# Patient Record
Sex: Female | Born: 1940 | Race: White | Hispanic: No | Marital: Single | Smoking: Former smoker
Health system: Southern US, Community
[De-identification: ages and names within clinical notes are randomized; demographics above are authoritative.]

## PROBLEM LIST (undated history)

## (undated) DIAGNOSIS — Z972 Presence of dental prosthetic device (complete) (partial): Secondary | ICD-10-CM

## (undated) DIAGNOSIS — A389 Scarlet fever, uncomplicated: Secondary | ICD-10-CM

## (undated) DIAGNOSIS — G4730 Sleep apnea, unspecified: Secondary | ICD-10-CM

## (undated) DIAGNOSIS — T8859XA Other complications of anesthesia, initial encounter: Secondary | ICD-10-CM

## (undated) DIAGNOSIS — F419 Anxiety disorder, unspecified: Secondary | ICD-10-CM

## (undated) DIAGNOSIS — G14 Postpolio syndrome: Secondary | ICD-10-CM

## (undated) DIAGNOSIS — M1990 Unspecified osteoarthritis, unspecified site: Secondary | ICD-10-CM

## (undated) DIAGNOSIS — I10 Essential (primary) hypertension: Secondary | ICD-10-CM

## (undated) DIAGNOSIS — T7840XA Allergy, unspecified, initial encounter: Secondary | ICD-10-CM

## (undated) DIAGNOSIS — T4145XA Adverse effect of unspecified anesthetic, initial encounter: Secondary | ICD-10-CM

## (undated) DIAGNOSIS — M2578 Osteophyte, vertebrae: Secondary | ICD-10-CM

## (undated) DIAGNOSIS — M2170 Unequal limb length (acquired), unspecified site: Secondary | ICD-10-CM

## (undated) DIAGNOSIS — J342 Deviated nasal septum: Secondary | ICD-10-CM

## (undated) HISTORY — DX: Essential (primary) hypertension: I10

## (undated) HISTORY — DX: Osteophyte, vertebrae: M25.78

## (undated) HISTORY — PX: APPENDECTOMY: SHX54

## (undated) HISTORY — DX: Allergy, unspecified, initial encounter: T78.40XA

## (undated) HISTORY — DX: Deviated nasal septum: J34.2

## (undated) HISTORY — PX: NASAL SEPTUM SURGERY: SHX37

## (undated) HISTORY — PX: CHOLECYSTECTOMY: SHX55

## (undated) HISTORY — PX: COLONOSCOPY WITH ESOPHAGOGASTRODUODENOSCOPY (EGD): SHX5779

## (undated) HISTORY — DX: Unspecified osteoarthritis, unspecified site: M19.90

## (undated) HISTORY — DX: Anxiety disorder, unspecified: F41.9

---
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
## 1998-09-14 ENCOUNTER — Encounter: Payer: Self-pay | Admitting: Family Medicine

## 2002-09-30 LAB — HM COLONOSCOPY

## 2005-08-07 ENCOUNTER — Encounter: Payer: Self-pay | Admitting: Family Medicine

## 2007-03-12 LAB — CONVERTED CEMR LAB: Pap Smear: NORMAL

## 2007-10-02 ENCOUNTER — Ambulatory Visit: Payer: Self-pay | Admitting: Family Medicine

## 2007-10-02 DIAGNOSIS — F411 Generalized anxiety disorder: Secondary | ICD-10-CM | POA: Insufficient documentation

## 2007-10-02 DIAGNOSIS — B91 Sequelae of poliomyelitis: Secondary | ICD-10-CM

## 2007-10-02 DIAGNOSIS — M1990 Unspecified osteoarthritis, unspecified site: Secondary | ICD-10-CM | POA: Insufficient documentation

## 2007-10-02 DIAGNOSIS — I10 Essential (primary) hypertension: Secondary | ICD-10-CM | POA: Insufficient documentation

## 2007-10-02 DIAGNOSIS — G4730 Sleep apnea, unspecified: Secondary | ICD-10-CM

## 2007-10-02 DIAGNOSIS — J309 Allergic rhinitis, unspecified: Secondary | ICD-10-CM | POA: Insufficient documentation

## 2007-10-02 DIAGNOSIS — R110 Nausea: Secondary | ICD-10-CM | POA: Insufficient documentation

## 2007-10-02 DIAGNOSIS — K644 Residual hemorrhoidal skin tags: Secondary | ICD-10-CM | POA: Insufficient documentation

## 2007-10-02 DIAGNOSIS — K449 Diaphragmatic hernia without obstruction or gangrene: Secondary | ICD-10-CM | POA: Insufficient documentation

## 2007-10-02 DIAGNOSIS — F341 Dysthymic disorder: Secondary | ICD-10-CM | POA: Insufficient documentation

## 2007-10-02 DIAGNOSIS — G4733 Obstructive sleep apnea (adult) (pediatric): Secondary | ICD-10-CM | POA: Insufficient documentation

## 2007-10-02 DIAGNOSIS — J4540 Moderate persistent asthma, uncomplicated: Secondary | ICD-10-CM | POA: Insufficient documentation

## 2007-10-02 DIAGNOSIS — G4700 Insomnia, unspecified: Secondary | ICD-10-CM

## 2007-10-02 DIAGNOSIS — E739 Lactose intolerance, unspecified: Secondary | ICD-10-CM | POA: Insufficient documentation

## 2007-10-02 DIAGNOSIS — R1310 Dysphagia, unspecified: Secondary | ICD-10-CM | POA: Insufficient documentation

## 2007-12-03 ENCOUNTER — Encounter: Payer: Self-pay | Admitting: Family Medicine

## 2007-12-03 DIAGNOSIS — N952 Postmenopausal atrophic vaginitis: Secondary | ICD-10-CM

## 2007-12-03 DIAGNOSIS — M8580 Other specified disorders of bone density and structure, unspecified site: Secondary | ICD-10-CM

## 2007-12-03 DIAGNOSIS — M899 Disorder of bone, unspecified: Secondary | ICD-10-CM | POA: Insufficient documentation

## 2007-12-16 ENCOUNTER — Encounter: Payer: Self-pay | Admitting: Family Medicine

## 2008-01-05 ENCOUNTER — Ambulatory Visit: Payer: Self-pay | Admitting: Family Medicine

## 2008-02-01 ENCOUNTER — Telehealth: Payer: Self-pay | Admitting: Family Medicine

## 2008-03-29 ENCOUNTER — Ambulatory Visit: Payer: Self-pay | Admitting: Family Medicine

## 2008-03-29 DIAGNOSIS — E7800 Pure hypercholesterolemia, unspecified: Secondary | ICD-10-CM

## 2008-03-31 ENCOUNTER — Ambulatory Visit: Payer: Self-pay | Admitting: Family Medicine

## 2008-04-05 LAB — CONVERTED CEMR LAB
ALT: 45 units/L — ABNORMAL HIGH (ref 0–35)
AST: 33 units/L (ref 0–37)
Albumin: 3.9 g/dL (ref 3.5–5.2)
BUN: 15 mg/dL (ref 6–23)
CO2: 31 meq/L (ref 19–32)
Calcium: 10.4 mg/dL (ref 8.4–10.5)
Chloride: 104 meq/L (ref 96–112)
Cholesterol: 227 mg/dL (ref 0–200)
Creatinine, Ser: 1.1 mg/dL (ref 0.4–1.2)
Direct LDL: 115.6 mg/dL
Hgb A1c MFr Bld: 5.8 % (ref 4.6–6.0)
Total Bilirubin: 0.8 mg/dL (ref 0.3–1.2)
Total CHOL/HDL Ratio: 2.9
Triglycerides: 72 mg/dL (ref 0–149)
VLDL: 14 mg/dL (ref 0–40)

## 2008-04-08 ENCOUNTER — Ambulatory Visit: Payer: Self-pay | Admitting: Family Medicine

## 2008-04-08 ENCOUNTER — Encounter: Payer: Self-pay | Admitting: Family Medicine

## 2008-04-08 LAB — CONVERTED CEMR LAB
BUN: 16 mg/dL (ref 6–23)
Calcium: 10.6 mg/dL — ABNORMAL HIGH (ref 8.4–10.5)
Chloride: 103 meq/L (ref 96–112)
Creatinine, Ser: 1 mg/dL (ref 0.4–1.2)
GFR calc Af Amer: 72 mL/min
GFR calc non Af Amer: 59 mL/min

## 2008-04-11 ENCOUNTER — Ambulatory Visit: Payer: Self-pay | Admitting: Family Medicine

## 2008-04-11 LAB — CONVERTED CEMR LAB: OCCULT 1: NEGATIVE

## 2008-04-15 ENCOUNTER — Telehealth: Payer: Self-pay | Admitting: Family Medicine

## 2008-04-15 ENCOUNTER — Ambulatory Visit: Payer: Self-pay | Admitting: Family Medicine

## 2008-04-15 LAB — CONVERTED CEMR LAB
BUN: 14 mg/dL (ref 6–23)
CO2: 29 meq/L (ref 19–32)
Calcium: 10.2 mg/dL (ref 8.4–10.5)
Creatinine, Ser: 1 mg/dL (ref 0.4–1.2)
GFR calc Af Amer: 72 mL/min
Glucose, Bld: 98 mg/dL (ref 70–99)

## 2008-04-26 ENCOUNTER — Encounter: Payer: Self-pay | Admitting: Family Medicine

## 2008-04-26 ENCOUNTER — Telehealth: Payer: Self-pay | Admitting: Family Medicine

## 2008-05-24 ENCOUNTER — Telehealth: Payer: Self-pay | Admitting: Family Medicine

## 2008-06-17 ENCOUNTER — Encounter (INDEPENDENT_AMBULATORY_CARE_PROVIDER_SITE_OTHER): Payer: Self-pay | Admitting: *Deleted

## 2008-06-17 ENCOUNTER — Telehealth: Payer: Self-pay | Admitting: Family Medicine

## 2008-06-24 ENCOUNTER — Telehealth: Payer: Self-pay | Admitting: Family Medicine

## 2008-06-29 ENCOUNTER — Ambulatory Visit: Payer: Self-pay | Admitting: Family Medicine

## 2008-07-01 LAB — CONVERTED CEMR LAB
Direct LDL: 138.1 mg/dL
Hgb A1c MFr Bld: 5.7 % (ref 4.6–6.0)
Total CHOL/HDL Ratio: 3.6
Triglycerides: 151 mg/dL — ABNORMAL HIGH (ref 0–149)

## 2008-07-05 ENCOUNTER — Ambulatory Visit: Payer: Self-pay | Admitting: Family Medicine

## 2008-07-21 ENCOUNTER — Telehealth: Payer: Self-pay | Admitting: Family Medicine

## 2008-08-16 ENCOUNTER — Ambulatory Visit: Payer: Self-pay | Admitting: Family Medicine

## 2008-08-22 ENCOUNTER — Telehealth: Payer: Self-pay | Admitting: Family Medicine

## 2008-09-20 ENCOUNTER — Telehealth: Payer: Self-pay | Admitting: Family Medicine

## 2008-09-22 ENCOUNTER — Telehealth: Payer: Self-pay | Admitting: Family Medicine

## 2008-10-05 ENCOUNTER — Ambulatory Visit: Payer: Self-pay | Admitting: Family Medicine

## 2008-10-11 ENCOUNTER — Ambulatory Visit: Payer: Self-pay | Admitting: Family Medicine

## 2008-10-11 DIAGNOSIS — E876 Hypokalemia: Secondary | ICD-10-CM | POA: Insufficient documentation

## 2008-10-11 LAB — CONVERTED CEMR LAB
ALT: 23 units/L (ref 0–35)
Albumin: 3.9 g/dL (ref 3.5–5.2)
BUN: 15 mg/dL (ref 6–23)
Bilirubin, Direct: 0.1 mg/dL (ref 0.0–0.3)
CO2: 33 meq/L — ABNORMAL HIGH (ref 19–32)
Calcium: 10.9 mg/dL — ABNORMAL HIGH (ref 8.4–10.5)
Cholesterol: 202 mg/dL (ref 0–200)
Direct LDL: 90 mg/dL
GFR calc Af Amer: 71 mL/min
Glucose, Bld: 81 mg/dL (ref 70–99)
HDL: 77.6 mg/dL (ref >39.0)
Sodium: 142 meq/L (ref 135–145)
Total Protein: 7.7 g/dL (ref 6.0–8.3)
Triglycerides: 129 mg/dL (ref 0–149)

## 2008-10-20 ENCOUNTER — Telehealth: Payer: Self-pay | Admitting: Family Medicine

## 2008-11-17 ENCOUNTER — Telehealth: Payer: Self-pay | Admitting: Family Medicine

## 2008-12-15 ENCOUNTER — Telehealth: Payer: Self-pay | Admitting: Family Medicine

## 2009-01-11 ENCOUNTER — Telehealth: Payer: Self-pay | Admitting: Family Medicine

## 2009-02-09 ENCOUNTER — Telehealth: Payer: Self-pay | Admitting: Family Medicine

## 2009-03-09 ENCOUNTER — Telehealth: Payer: Self-pay | Admitting: Family Medicine

## 2009-04-06 ENCOUNTER — Telehealth: Payer: Self-pay | Admitting: Family Medicine

## 2009-04-11 ENCOUNTER — Ambulatory Visit: Payer: Self-pay | Admitting: Family Medicine

## 2009-04-11 LAB — CONVERTED CEMR LAB
Alkaline Phosphatase: 45 units/L (ref 39–117)
Bilirubin, Direct: 0.1 mg/dL (ref 0.0–0.3)
Calcium: 9.7 mg/dL (ref 8.4–10.5)
Cholesterol: 223 mg/dL — ABNORMAL HIGH (ref 0–200)
Creatinine, Ser: 1 mg/dL (ref 0.4–1.2)
Sodium: 140 meq/L (ref 135–145)
Total Bilirubin: 1.1 mg/dL (ref 0.3–1.2)
Total CHOL/HDL Ratio: 3
Triglycerides: 131 mg/dL (ref 0.0–149.0)
VLDL: 26.2 mg/dL (ref 0.0–40.0)

## 2009-04-18 ENCOUNTER — Ambulatory Visit: Payer: Self-pay | Admitting: Family Medicine

## 2009-05-04 ENCOUNTER — Telehealth: Payer: Self-pay | Admitting: Family Medicine

## 2009-06-02 ENCOUNTER — Telehealth: Payer: Self-pay | Admitting: Family Medicine

## 2009-06-27 ENCOUNTER — Ambulatory Visit: Payer: Self-pay | Admitting: Family Medicine

## 2009-06-28 LAB — CONVERTED CEMR LAB
BUN: 10 mg/dL (ref 6–23)
Chloride: 100 meq/L (ref 96–112)
Creatinine, Ser: 0.9 mg/dL (ref 0.4–1.2)
GFR calc non Af Amer: 66.35 mL/min (ref >60)
Glucose, Bld: 82 mg/dL (ref 70–99)

## 2009-07-03 ENCOUNTER — Telehealth: Payer: Self-pay | Admitting: Family Medicine

## 2009-07-06 ENCOUNTER — Encounter: Payer: Self-pay | Admitting: Family Medicine

## 2009-07-19 ENCOUNTER — Ambulatory Visit: Payer: Self-pay | Admitting: Family Medicine

## 2009-07-20 LAB — CONVERTED CEMR LAB
Calcium: 10.2 mg/dL (ref 8.4–10.5)
Creatinine, Ser: 1.1 mg/dL (ref 0.4–1.2)
GFR calc non Af Amer: 52.63 mL/min (ref >60)
Glucose, Bld: 95 mg/dL (ref 70–99)
Sodium: 142 meq/L (ref 135–145)

## 2009-07-31 ENCOUNTER — Telehealth: Payer: Self-pay | Admitting: Family Medicine

## 2009-08-07 LAB — HM MAMMOGRAPHY: HM Mammogram: NORMAL

## 2009-08-28 ENCOUNTER — Telehealth: Payer: Self-pay | Admitting: Family Medicine

## 2009-09-26 ENCOUNTER — Telehealth: Payer: Self-pay | Admitting: Family Medicine

## 2009-10-24 ENCOUNTER — Telehealth: Payer: Self-pay | Admitting: Family Medicine

## 2009-10-25 ENCOUNTER — Ambulatory Visit: Payer: Self-pay | Admitting: Family Medicine

## 2009-10-27 LAB — CONVERTED CEMR LAB
Chloride: 105 meq/L (ref 96–112)
Creatinine, Ser: 1.1 mg/dL (ref 0.4–1.2)
GFR calc non Af Amer: 52.59 mL/min (ref >60)

## 2009-11-22 ENCOUNTER — Telehealth: Payer: Self-pay | Admitting: Family Medicine

## 2009-12-19 ENCOUNTER — Ambulatory Visit: Payer: Self-pay | Admitting: Family Medicine

## 2009-12-21 ENCOUNTER — Telehealth: Payer: Self-pay | Admitting: Family Medicine

## 2010-01-18 ENCOUNTER — Telehealth: Payer: Self-pay | Admitting: Family Medicine

## 2010-03-22 ENCOUNTER — Telehealth: Payer: Self-pay | Admitting: Family Medicine

## 2010-04-27 ENCOUNTER — Telehealth: Payer: Self-pay | Admitting: Family Medicine

## 2010-04-27 ENCOUNTER — Ambulatory Visit: Payer: Self-pay | Admitting: Family Medicine

## 2010-07-30 ENCOUNTER — Ambulatory Visit: Payer: Self-pay | Admitting: Family Medicine

## 2010-08-07 ENCOUNTER — Ambulatory Visit: Payer: Self-pay | Admitting: Family Medicine

## 2010-08-07 LAB — CONVERTED CEMR LAB
ALT: 29 units/L (ref 0–35)
Albumin: 4.5 g/dL (ref 3.5–5.2)
BUN: 19 mg/dL (ref 6–23)
Bilirubin, Direct: 0.1 mg/dL (ref 0.0–0.3)
CO2: 26 meq/L (ref 19–32)
Calcium: 9.9 mg/dL (ref 8.4–10.5)
Chloride: 102 meq/L (ref 96–112)
Cholesterol: 262 mg/dL — ABNORMAL HIGH (ref 0–200)
Creatinine, Ser: 1 mg/dL (ref 0.4–1.2)
Direct LDL: 136.6 mg/dL
Glucose, Bld: 91 mg/dL (ref 70–99)
Total CHOL/HDL Ratio: 3
Total Protein: 7.4 g/dL (ref 6.0–8.3)
Triglycerides: 113 mg/dL (ref 0.0–149.0)

## 2010-08-14 ENCOUNTER — Ambulatory Visit: Payer: Self-pay | Admitting: Family Medicine

## 2010-12-12 NOTE — Assessment & Plan Note (Signed)
Summary: TICK BITE/JBB   Vital Signs:  Patient Profile:   70 Years Old Female CC:      Tick Bite / RWT Height:     60.5 inches Weight:      136 pounds BMI:     26.22 O2 Sat:      98 % O2 treatment:    Room Air Temp:     97.8 degrees F oral Pulse rate:   114 / minute Pulse rhythm:   regular Resp:     20 per minute BP sitting:   168 / 101  (left arm)  Pt. in pain?   no  Vitals Entered By: Levonne Spiller EMT-P (April 27, 2010 11:30 AM)              Is Patient Diabetic? No      Current Allergies: ! ERYTHROMYCIN ! CODEINEHistory of Present Illness History from: patient Reason for visit: see chief complaint Chief Complaint: Tick Bite / RWT History of Present Illness: Patient noticed a tick below her left lateral ankle this morning when she woke up. There was erythema present before she removed the tick. She's unsure of how long it was there as she could not see it. She saved the tick and brought it in. Denies itching or irritation. No f/c malaise or headache.   REVIEW OF SYSTEMS Constitutional Symptoms      Denies fever, chills, night sweats, weight loss, weight gain, and fatigue.  Eyes       Denies change in vision, eye pain, eye discharge, glasses, contact lenses, and eye surgery. Ear/Nose/Throat/Mouth       Denies hearing loss/aids, change in hearing, ear pain, ear discharge, dizziness, frequent runny nose, frequent nose bleeds, sinus problems, sore throat, hoarseness, and tooth pain or bleeding.  Respiratory       Denies dry cough, productive cough, wheezing, shortness of breath, asthma, bronchitis, and emphysema/COPD.  Cardiovascular       Denies murmurs, chest pain, and tires easily with exhertion.    Gastrointestinal       Denies stomach pain, nausea/vomiting, diarrhea, constipation, blood in bowel movements, and indigestion. Genitourniary       Denies painful urination, kidney stones, and loss of urinary control. Neurological       Denies paralysis, seizures, and  fainting/blackouts. Musculoskeletal       Denies muscle pain, joint pain, joint stiffness, decreased range of motion, redness, swelling, muscle weakness, and gout.  Skin       Denies bruising, unusual mles/lumps or sores, and hair/skin or nail changes.  Psych       Denies mood changes, temper/anger issues, anxiety/stress, speech problems, depression, and sleep problems. Physical Exam General appearance: well developed, well nourished, no acute distress Chest/Lungs: no rales, wheezes, or rhonchi bilateral, breath sounds equal without effort Heart: regular rate and  rhythm, no murmur Skin: 1cm non blanching erythematous patch below left lateral malleolus MSE: anxious Assessment New Problems: INSECT BITE (ICD-919.4) TICK BITE (ICD-E906.4)   Plan New Medications/Changes: DOXYCYCLINE HYCLATE 100 MG TABS (DOXYCYCLINE HYCLATE) 2 by mouth x 1 dose  #2 x 0, 04/27/2010, Tacey Ruiz MD  New Orders: Specimen Handling [99000] Est. Patient Level II [13244] T-tick ID with reflex to Lyme Disease [01027]  The patient and/or caregiver has been counseled thoroughly with regard to medications prescribed including dosage, schedule, interactions, rationale for use, and possible side effects and they verbalize understanding.  Diagnoses and expected course of recovery discussed and will return if not improved as expected  or if the condition worsens. Patient and/or caregiver verbalized understanding.  Prescriptions: DOXYCYCLINE HYCLATE 100 MG TABS (DOXYCYCLINE HYCLATE) 2 by mouth x 1 dose  #2 x 0   Entered and Authorized by:   Tacey Ruiz MD   Signed by:   Tacey Ruiz MD on 04/27/2010   Method used:   Electronically to        [facility] [address] (retail)       [address], [address]       [place], [place]  [address]       Ph: [phone]       Fax: [phone]   RxID:   [id number]   Patient Instructions: 1)  Continue to monitor the area where the tick was  removed. If you noticed that the red area is spreading please call/return. If you develop fever/chills, fatigue, neck stiffness, headache or arthralgias then call/return.   Orders Added: 1)  Specimen Handling [99000] 2)  Est. Patient Level II [40102] 3)  T-tick ID with reflex to Lyme Disease [81666]  The risks, benefits and possible side effects of the treatments and tests were explained clearly to the patient and the patient verbalized understanding.  The patient was informed that there is no on-call provider or services available at this clinic during off-hours (when the clinic is closed).  If the patient developed a problem or concern that required immediate attention, the patient was advised to go the the nearest available urgent care or emergency department for medical care.  The patient verbalized understanding.

## 2010-12-12 NOTE — Progress Notes (Signed)
  Phone Note Refill Request Message from:  Scriptline on November 22, 2009 10:51 AM  Refills Requested: Medication #1:  ATIVAN 1 MG  TABS Take 1 tab by mouth at bedtime as needed   Supply Requested: [facility] month [facility] [phone]   Method Requested: Telephone to Pharmacy Initial call taken by: Benny Lennert CMA Duncan Dull),  November 22, 2009 10:51 AM  Follow-up for Phone Call        rx called in Follow-up by: Benny Lennert CMA (AAMA),  November 23, 2009 11:22 AM    Prescriptions: ATIVAN 1 MG  TABS (LORAZEPAM) Take 1 tab by mouth at bedtime as needed  #30 x 0   Entered and Authorized by:   Kerby Nora MD   Signed by:   Kerby Nora MD on 11/23/2009   Method used:   Telephoned to ...       [facility] [address] (retail)       [address]       [place], [place]  [address]       Ph: [phone]       Fax: [phone]   RxID:   [id number]

## 2010-12-12 NOTE — Progress Notes (Signed)
Summary: Rx Ativan   Phone Note Refill Request Call back at [phone] Message from:  Scriptline on December 21, 2009 10:48 AM  Refills Requested: Medication #1:  ATIVAN 1 MG  TABS Take 1 tab by mouth at bedtime as needed   Last Refilled: 11/23/2009 Received e-script request, please advise   Method Requested: Telephone to Pharmacy Initial call taken by: Linde Gillis CMA [facility] Dull),  December 21, 2009 10:51 AM  Follow-up for Phone Call        Rx called to pharmacy Follow-up by: Linde Gillis CMA [facility] Dull),  December 21, 2009 4:51 PM    Prescriptions: ATIVAN 1 MG  TABS (LORAZEPAM) Take 1 tab by mouth at bedtime as needed  #30 x 0   Entered and Authorized by:   Kerby Nora MD   Signed by:   Kerby Nora MD on 12/21/2009   Method used:   Telephoned to ...       [facility] [address] (retail)       [address]       [place], [place]  [address]       Ph: [phone]       Fax: [phone]   RxID:   [id number]

## 2010-12-12 NOTE — Progress Notes (Signed)
  Phone Note Refill Request Message from:  Scriptline on January 18, 2010 9:33 AM  Refills Requested: Medication #1:  ATIVAN 1 MG  TABS Take 1 tab by mouth at bedtime as needed   Supply Requested: 1 month [facility] [address]   Method Requested: Telephone to Pharmacy Initial call taken by: Benny Lennert CMA Duncan Dull),  January 18, 2010 9:34 AM  Follow-up for Phone Call        Rx called to pharmacy Follow-up by: Benny Lennert CMA Duncan Dull),  January 22, 2010 7:42 AM    Prescriptions: ATIVAN 1 MG  TABS (LORAZEPAM) Take 1 tab by mouth at bedtime as needed  #30 x 0   Entered and Authorized by:   Kerby Nora MD   Signed by:   Kerby Nora MD on 01/22/2010   Method used:   Telephoned to ...       [facility] [address] (retail)       [address]       [place], [place]  [address]       Ph: [phone]       Fax: [phone]   RxID:   [id number]

## 2010-12-12 NOTE — Assessment & Plan Note (Signed)
Summary: CPX.Marland KitchenMarland KitchenRESCH W/ PT CYD   Vital Signs:  Patient profile:   70 year old female Height:      60.5 inches Weight:      137.0 pounds BMI:     26.41 Temp:     97.8 degrees F oral Pulse rate:   96 / minute Pulse rhythm:   regular BP sitting:   120 / 78  (left arm) Cuff size:   large  Vitals Entered By: Benny Lennert CMA Duncan Dull) (August 14, 2010 11:09 AM)  History of Present Illness: HTN,well controlled  High cholesterol..LDL elevated some above goal LDL <130. Red yeast rice causes constipation.  In past few months shehas been feeling fluttering in chest..most often in AMs. Wearing CPAP at night.  No exertional chest pain. No increase in SOB. Last EKG 4 years ago...she refuses work up further at this time.  She feels it is due to post polio .  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  Problems Prior to Update: 1)  Need Prophylactic Vaccination&inoculation Flu  (ICD-V04.81) 2)  Hypokalemia  (ICD-276.8) 3)  Hypercalcemia  (ICD-275.42) 4)  Special Screening Malig Neoplasms Other Sites  (ICD-V76.49) 5)  Skin Tag  (ICD-701.9) 6)  Other Screening Mammogram  (ICD-V76.12) 7)  Glucose Intolerance  (ICD-271.3) 8)  Hypercholesterolemia  (ICD-272.0) 9)  Osteopenia  (ICD-733.90) 10)  Vaginitis, Atrophic  (ICD-627.3) 11)  Family History of Alcoholism/addiction  (ICD-V61.41) 12)  Dysthymia  (ICD-300.4) 13)  Insomnia  (ICD-780.52) 14)  Anxiety  (ICD-300.00) 15)  Allergic Rhinitis  (ICD-477.9) 16)  Asthma  (ICD-493.90) 17)  Sleep Apnea  (ICD-780.57) 18)  Hypertension  (ICD-401.9) 19)  External Hemorrhoids  (ICD-455.3) 20)  Impaired Glucose Tolerance  (ICD-271.3) 21)  Post-polio Syndrome  (ICD-138) 22)  Osteoarthritis  (ICD-715.90) 23)  Nausea, Chronic  (ICD-787.02) 24)  Esophageal Motility Disorder, and Entire Gi Tract  (ICD-530.5) 25)  Hiatal Hernia  (ICD-553.3) 26)  Dysphagia Unspecified   (ICD-787.20)  Current Medications (verified): 1)  Advair Diskus 250-50 Mcg/dose  Misc (Fluticasone-Salmeterol) .... Two Times A Day 2)  Singulair 10 Mg  Tabs (Montelukast Sodium) .... Take 1 Tablet By Mouth Once A Day 3)  Triamterene-Hctz 75-50 Mg Tabs (Triamterene-Hctz) .... Take 1/2 Tablet Daily 4)  Potassium Chloride Crys Cr 20 Meq  Tbcr (Potassium Chloride Crys Cr) .... Take 2 Tablet By Mouth Once A Day 5)  Amlodipine Besylate 5 Mg  Tabs (Amlodipine Besylate) .... Take 1 Tablet By Mouth Once A Day 6)  Ibuprofen 800 Mg  Tabs (Ibuprofen) .... Take 1 Tablet By Mouth Four Times A Day 7)  Ra Glucosamine-Chondroit-Msm 500-400-300 Mg  Tabs (Glucosamine-Chondroitin-Msm) .... Take 3 Extended Release Tablets Per Day 8)  Caltrate 600+d Plus 600-400 Mg-Unit  Tabs (Calcium Carbonate-Vit D-Min) .... Take 1 Tablet By Mouth One Tablet A Day 9)  Proair Hfa 108 (90 Base) Mcg/act  Aers (Albuterol Sulfate) .... 2 Puffs 4 Times A Day 10)  Otc Nasal Spray .... At Bedtime, Both Nostrils 11)  Sena .Marland Kitchen.. 3 - 4 Tabs Daily 12)  Fluticasone Propionate 50 Mcg/act Susp (Fluticasone Propionate) .... 2 Sprays Per Nostril Daily 13)  Melatonin 5 Mg Tabs (Melatonin) .... One Tablet At Night 14)  Latanoproso   .005%  Allergies: 1)  ! Erythromycin 2)  ! Codeine  Past History:  Past medical, surgical, family and social histories (including risk factors) reviewed, and no changes noted (except as noted below).  Past Medical History: Reviewed history  from 10/02/2007 and no changes required. 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 05/2006 barium swallow: cervical osteophyte 8/07 stress nuclear: nml 1955  fusion R foot for club foot 1//2007: left hip x-ray: arthritis   09/18/05 sleep study severe apnea; on CPAP RLL contracture release 1950 2002 deviated septum repair Appendectomy Cholecystectomy wears AFO on right leg  Family History: Reviewed history from 10/02/2007 and no changes required. son : DM father: DM mother : lung cancer PGM: DM WUJ:WJXBJ cancer  MGF: colon cancer Family History of Alcoholism/Addiction, parents  Social History: Reviewed history from 10/02/2007 and no changes required. On disability for post polio syndrome retired [employer] fund[employer] Single Never Smoked Alcohol use-yes, wine occ  Drug use-no Regular exercise-yes, lifting weights with arms, swimming Diet: healthy   Impression & Recommendations:  Problem # 1:  HYPERTENSION (ICD-401.9) Well controlled. Continue current medication.  Her updated medication list for this problem includes:    Triamterene-hctz 75-50 Mg Tabs (Triamterene-hctz) .Marland Kitchen... Take 1/2 tablet daily    Amlodipine Besylate 5 Mg Tabs (Amlodipine besylate) .Marland Kitchen... Take 1 tablet by mouth once a day  Problem # 2:  HYPOKALEMIA (ICD-276.8) Resolved.   Problem # 3:  HYPERCHOLESTEROLEMIA (ICD-272.0) Reviewed labs in detail with pt.  She will work on lifesstyle changes...refuses medication.  Labs Reviewed: SGOT: 21 (08/07/2010)   SGPT: 29 (08/07/2010)  Lipid Goals: Chol Goal: 200 (10/11/2008)   HDL Goal: 40 (10/11/2008)   LDL Goal: 100 (10/11/2008)   TG Goal: 150 (10/11/2008)  Prior 10 Yr Risk Heart Disease: 13 % (12/19/2009)   HDL:80.80 (08/07/2010), 71.40 (04/11/2009)  LDL:125 (04/11/2009), DEL (10/05/2008)  Chol:262 (08/07/2010), 223 (04/11/2009)  Trig:113.0 (08/07/2010), 131.0 (04/11/2009)  Problem # 4:  HYPERCALCEMIA (ICD-275.42) resolved   Problem # 5:  IMPAIRED GLUCOSE TOLERANCE (ICD-271.3) Improved with lifestyle cahnge.   Problem # 6:  Preventive Health Care (ICD-V70.0) Assessment: Comment Only The patient's preventative maintenance and recommended screening tests for an annual wellness exam were  reviewed in full today. Brought up to date unless services declined.  Counselled on the importance of diet, exercise, and its role in overall health and mortality. The patient's FH and SH was reviewed, including their home life, tobacco status, and drug and alcohol status.     Complete Medication List: 1)  Advair Diskus 250-50 Mcg/dose Misc (Fluticasone-salmeterol) .... Two times a day 2)  Singulair 10 Mg Tabs (Montelukast sodium) .... Take 1 tablet by mouth once a day 3)  Triamterene-hctz 75-50 Mg Tabs (Triamterene-hctz) .... Take 1/2 tablet daily 4)  Potassium Chloride Crys Cr 20 Meq Tbcr (Potassium chloride crys cr) .... Take 2 tablet by mouth once a day 5)  Amlodipine Besylate 5 Mg Tabs (Amlodipine besylate) .... Take 1 tablet by mouth once a day 6)  Ibuprofen 800 Mg Tabs (Ibuprofen) .... Take 1 tablet by mouth four times a day 7)  Ra Glucosamine-chondroit-msm 500-400-300 Mg Tabs (Glucosamine-chondroitin-msm) .... Take 3 extended release tablets per day 8)  Caltrate 600+d Plus 600-400 Mg-unit Tabs (Calcium carbonate-vit d-min) .... Take 1 tablet by mouth one tablet a day 9)  Proair Hfa 108 (90 Base) Mcg/act Aers (Albuterol sulfate) .... 2 puffs 4 times a day 10)  Otc Nasal Spray  .... At bedtime, both nostrils 11)  Sena  .Marland Kitchen.. 3 - 4 tabs daily 12)  Fluticasone Propionate 50 Mcg/act Susp (Fluticasone propionate) .Marland KitchenMarland KitchenMarland Kitchen  2 sprays per nostril daily 13)  Melatonin 5 Mg Tabs (Melatonin) .... One tablet at night 14)  Latanoproso .005%   Other Orders: Zoster (Shingles) Vaccine Live [phone]) Admin 1st Vaccine (11914) Radiology Referral (Radiology) Radiology Referral (Radiology)  Patient Instructions: 1)  Referral Appointment Information 2)  Day/Date: 3)  Time: 4)  Place/MD: 5)  Address: 6)  Phone/Fax: 7)  Patient given appointment information. Information/Orders faxed/mailed.  8)  Please schedule a follow-up appointment in 6 months  30 min.  Current Allergies (reviewed today): !  ERYTHROMYCIN ! CODEINE   Immunizations Administered:  Zostavax # 1:    Vaccine Type: Zostavax    Site: left deltoid    Mfr: Merck    Dose: 0.5 ml    Route: [place]    Given by: Benny Lennert CMA (AAMA)    Exp. Date: 06/29/2011    Lot #: [id number]    VIS given: 08/23/05 given August 14, 2010.  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       Osteoarthritis, left hip       Diabetes mellitus, type II       Hypertension       Asthma, moderate persistant       Allergic rhinitis       Anxiety associated with dyphagia  Past Surgical History:    Reviewed history from 10/02/2007 and no changes required:       05/2006 barium swallow: cervical osteophyte       8/07 stress nuclear: nml       1955  fusion R foot for club foot       1//2007: left hip x-ray: arthritis        09/18/05 sleep study severe apnea; on CPAP       RLL contracture release 1950       2002 deviated septum repair       Appendectomy       Cholecystectomy       wears AFO on right leg

## 2010-12-12 NOTE — Assessment & Plan Note (Signed)
Summary: flu shot/jbb  Nurse Visit   Allergies: 1)  ! Erythromycin 2)  ! Codeine  Immunizations Administered:  Influenza Vaccine:    Vaccine Type: FLULAVAL    Site: left deltoid    Mfr: GlaxoSmithKline    Dose: 0.5 ml    Route: IM    Given by: Levonne Spiller EMT-P    Exp. Date: 04/11/2011    Lot #: [id number]    VIS given: 06/05/10 version given July 30, 2010.  Orders Added: 1)  Flu Vaccine 2yrs + [90658] 2)  Admin 1st Vaccine [90471]   Immunizations Administered:  Influenza Vaccine:    Vaccine Type: FLULAVAL    Site: left deltoid    Mfr: GlaxoSmithKline    Dose: 0.5 ml    Route: IM    Given by: Levonne Spiller EMT-P    Exp. Date: 04/11/2011    Lot #: [id number]    VIS given: 06/05/10 version given July 30, 2010.  Flu Vaccine Consent Questions:    Do you have a history of severe allergic reactions to this vaccine? no    Any prior history of allergic reactions to egg and/or gelatin? no    Do you have a sensitivity to the preservative Thimersol? no    Do you have a past history of Guillan-Barre Syndrome? no    Do you currently have an acute febrile illness? no    Have you ever had a severe reaction to latex? no    Vaccine information given and explained to patient? yes    Are you currently pregnant? no

## 2010-12-12 NOTE — Progress Notes (Signed)
Summary: ? Dosing for Triamt/HCTZ  Phone Note From Pharmacy Call back at [phone]   Caller: [facility] Call For: Dr. Ermalene Searing  Summary of Call: Received fax from pharmacy stating that Triamt/HCTZ 37.5/25mg  is not available, they have 75/50mg .  They want to know if the patient can use what they have and if so please send Rx with directions.  Please advise.  Initial call taken by: Linde Gillis CMA Duncan Dull),  Mar 22, 2010 2:44 PM  Follow-up for Phone Call        yes, use 75/50 1/2 tab by mouth daily, #15 5 refills Follow-up by: Hannah Beat MD,  Mar 22, 2010 5:35 PM  Additional Follow-up for Phone Call Additional follow up Details #1::        rx sent to pharamcy Additional Follow-up by: Benny Lennert CMA Duncan Dull),  Mar 23, 2010 7:58 AM    New/Updated Medications: TRIAMTERENE-HCTZ 75-50 MG TABS (TRIAMTERENE-HCTZ) take 1/2 tablet daily Prescriptions: TRIAMTERENE-HCTZ 75-50 MG TABS (TRIAMTERENE-HCTZ) take 1/2 tablet daily  #15 x 6   Entered by:   Benny Lennert CMA (AAMA)   Authorized by:   Kerby Nora MD   Signed by:   Benny Lennert CMA (AAMA) on 03/23/2010   Method used:   Electronically to        [facility] [address] (retail)       [address]       [place], [place]  [address]       Ph: [phone]       Fax: [phone]   RxID:   [id number]

## 2010-12-12 NOTE — Progress Notes (Signed)
Summary: tick bite  Phone Note Call from Patient   Caller: Patient Call For: Kerby Nora MD Summary of Call: Pt states she pulled a very small tick from her ankle this morning and noticed a round ring around the bite.  Wants to be seen today but there are no appts available.  I suggested she go to urgent care at [facility] or [facility] clinic tomorrow.  [facility] to [facility]. Initial call taken by: Lowella Petties CMA,  April 27, 2010 8:29 AM

## 2010-12-12 NOTE — Assessment & Plan Note (Signed)
Summary: roa  cyd   Vital Signs:  Patient profile:   70 year old female Weight:      136.50 pounds BMI:     25.47 Temp:     76 degrees F oral Pulse rate:   76 / minute Pulse rhythm:   regular BP sitting:   120 / 62  (left arm) Cuff size:   large  Vitals Entered By: Linde Gillis CMA Duncan Dull) (December 19, 2009 12:39 PM) CC: 6 month follow up, Hypertension Management   History of Present Illness: Allergic rhinitis, recent viral infection.  Interested in refilling flonase given this has helped significantly.  CPAP contributing to irritation.  Guafenesin has helped a lot woth chronic mucus producation...improved choking.  Did have some leg spasms in fall  when getting into cold water at pool.  None otherwise. Potassium at that time was normla.   Hypertension History:      She denies headache, chest pain, palpitations, dyspnea with exertion, peripheral edema, and side effects from treatment.  Well controlled at home. Marland Kitchen        Positive major cardiovascular risk factors include female age 14 years old or older, diabetes, hyperlipidemia, and hypertension.  Negative major cardiovascular risk factors include non-tobacco-user status.     Problems Prior to Update: 1)  Uri  (ICD-465.9) 2)  Hypokalemia  (ICD-276.8) 3)  Hypercalcemia  (ICD-275.42) 4)  Special Screening Malig Neoplasms Other Sites  (ICD-V76.49) 5)  Skin Tag  (ICD-701.9) 6)  Other Screening Mammogram  (ICD-V76.12) 7)  Glucose Intolerance  (ICD-271.3) 8)  Hypercholesterolemia  (ICD-272.0) 9)  Osteopenia  (ICD-733.90) 10)  Vaginitis, Atrophic  (ICD-627.3) 11)  Family History of Alcoholism/addiction  (ICD-V61.41) 12)  Dysthymia  (ICD-300.4) 13)  Insomnia  (ICD-780.52) 14)  Anxiety  (ICD-300.00) 15)  Allergic Rhinitis  (ICD-477.9) 16)  Asthma  (ICD-493.90) 17)  Sleep Apnea  (ICD-780.57) 18)  Hypertension  (ICD-401.9) 19)  External Hemorrhoids  (ICD-455.3) 20)  Impaired Glucose Tolerance  (ICD-271.3) 21)   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 Two Times A Day 2)  Singulair 10 Mg  Tabs (Montelukast Sodium) .... Take 1 Tablet By Mouth Once A Day 3)  Triamterene-Hctz 37.5-25 Mg Tabs (Triamterene-Hctz) .Marland Kitchen.. 1 Tab By Mouth Daily 4)  Potassium Chloride Crys Cr 20 Meq  Tbcr (Potassium Chloride Crys Cr) .... Take 2 Tablet By Mouth Once A Day 5)  Amlodipine Besylate 5 Mg  Tabs (Amlodipine Besylate) .... Take 1 Tablet By Mouth Once A Day 6)  Ativan 1 Mg  Tabs (Lorazepam) .... Take 1 Tab By Mouth At Bedtime As Needed 7)  Ibuprofen 800 Mg  Tabs (Ibuprofen) .... Take 1 Tablet By Mouth Four Times A Day 8)  Ra Glucosamine-Chondroit-Msm 500-400-300 Mg  Tabs (Glucosamine-Chondroitin-Msm) .... Take 3 Extended Release Tablets Per Day 9)  Caltrate 600+d Plus 600-400 Mg-Unit  Tabs (Calcium Carbonate-Vit D-Min) .... Take 1 Tablet By Mouth One Tablet A Day 10)  Lumigan 0.03 %  Soln (Bimatoprost) .... One Drop To Left Eye Once Daily 11)  Proair Hfa 108 (90 Base) Mcg/act  Aers (Albuterol Sulfate) .... 2 Puffs 4 Times A Day 12)  Otc Nasal Spray .... At Bedtime, Both Nostrils 13)  Sena .Marland Kitchen.. 3 - 4  Tabs Daily 14)  Fluticasone Propionate 50 Mcg/act Susp (Fluticasone Propionate) .... 2 Sprays Per Nostril Daily  Allergies: 1)  ! Erythromycin 2)  ! Codeine  Past History:  Past medical, surgical, family and social histories (including risk factors) reviewed, and no changes noted (except as noted below).  Past Medical History: Reviewed history from 10/02/2007 and no changes required. 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 05/2006 barium swallow: cervical osteophyte 8/07 stress nuclear: nml 1955  fusion R foot for club foot 1//2007: left hip x-ray: arthritis  09/18/05 sleep study severe apnea; on CPAP RLL contracture release 1950 2002 deviated septum repair Appendectomy Cholecystectomy wears AFO on right leg  Family History: Reviewed history from 10/02/2007 and no changes required. son : DM father: DM mother : lung cancer PGM: DM MVH:QIONG cancer  MGF: colon cancer Family History of Alcoholism/Addiction, parents  Social History: Reviewed history from 10/02/2007 and no changes required. On disability for post polio syndrome retired [employer] fundraiser Single Never Smoked Alcohol use-yes, wine occ  Drug use-no Regular exercise-yes, lifting weights with arms, swimming Diet: healthy  Review of Systems General:  Denies fatigue and fever. CV:  Denies chest pain or discomfort. Resp:  Denies shortness of breath. GI:  Denies abdominal pain. GU:  Denies dysuria.  Physical Exam  General:  Well-developed,well-nourished,in no acute distress; alert,appropriate and cooperative throughout examination brace on right lower leg Mouth:  Oral mucosa and oropharynx without lesions or exudates.  MMM Neck:  no carotid bruit or thyromegaly no cervical or supraclavicular lymphadenopathy  Lungs:  Normal respiratory effort, chest expands symmetrically. Lungs are clear to auscultation, no crackles or wheezes. Heart:  Normal rate and regular rhythm. S1 and S2 normal without gallop, murmur, click, rub or other extra sounds. Abdomen:  Bowel sounds positive,abdomen soft and non-tender without masses, organomegaly or hernias noted. Pulses:  R and L posterior tibial pulses are full and equal bilaterally  Extremities:  right leg in brace, no edema   Impression &  Recommendations:  Problem # 1:  ALLERGIC RHINITIS (ICD-477.9) Improved with mucinex and nasal steroid spray.  Her updated medication list for this problem includes:    Fluticasone Propionate 50 Mcg/act Susp (Fluticasone propionate) .Marland Kitchen... 2 sprays per nostril daily  Problem # 2:  HYPERTENSION (ICD-401.9) Well controlled. Continue current medication.  Her updated medication list for this problem includes:    Triamterene-hctz 37.5-25 Mg Tabs (Triamterene-hctz) .Marland Kitchen... 1 tab by mouth daily    Amlodipine Besylate 5 Mg Tabs (Amlodipine besylate) .Marland Kitchen... Take 1 tablet by mouth once a day  BP today: 120/62 Prior BP: 114/66 (06/27/2009)  Prior 10 Yr Risk Heart Disease: 8 % (10/11/2008)  Labs Reviewed: K+: 3.8 (10/25/2009) Creat: : 1.1 (10/25/2009)   Chol: 223 (04/11/2009)   HDL: 71.40 (04/11/2009)   LDL: 125 (04/11/2009)   TG: 131.0 (04/11/2009)  Problem # 3:  ESOPHAGEAL MOTILITY DISORDER, AND ENTIRE GI TRACT (ICD-530.5) stable desptie having to stop reglan due to SE.   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   Complete Medication List: 1)  Advair Diskus 250-50 Mcg/dose Misc (Fluticasone-salmeterol) .... Two times a day 2)  Singulair 10 Mg Tabs (Montelukast sodium) .... Take 1 tablet by mouth once a day 3)  Triamterene-hctz 37.5-25 Mg Tabs (Triamterene-hctz) .Marland Kitchen.. 1 tab by mouth daily 4)  Potassium Chloride Crys Cr 20  Meq Tbcr (Potassium chloride crys cr) .... Take 2 tablet by mouth once a day 5)  Amlodipine Besylate 5 Mg Tabs (Amlodipine besylate) .... Take 1 tablet by mouth once a day 6)  Ativan 1 Mg Tabs (Lorazepam) .... Take 1 tab by mouth at bedtime as needed 7)  Ibuprofen 800 Mg Tabs (Ibuprofen) .... Take 1 tablet by mouth four times a day 8)  Ra Glucosamine-chondroit-msm 500-400-300 Mg Tabs (Glucosamine-chondroitin-msm) .... Take 3 extended release tablets per day 9)  Caltrate 600+d Plus 600-400 Mg-unit Tabs (Calcium carbonate-vit d-min) .... Take 1 tablet by mouth one  tablet a day 10)  Lumigan 0.03 % Soln (Bimatoprost) .... One drop to left eye once daily 11)  Proair Hfa 108 (90 Base) Mcg/act Aers (Albuterol sulfate) .... 2 puffs 4 times a day 12)  Otc Nasal Spray  .... At bedtime, both nostrils 13)  Sena  .Marland Kitchen.. 3 - 4 tabs daily 14)  Fluticasone Propionate 50 Mcg/act Susp (Fluticasone propionate) .... 2 sprays per nostril daily  Hypertension Assessment/Plan:      The patient's hypertensive risk group is category C: Target organ damage and/or diabetes.  Her calculated 10 year risk of coronary heart disease is 13 %.  Today's blood pressure is 120/62.  Her blood pressure goal is < 130/80.  Patient Instructions: 1)  Schedule CPX in 06/2010 with fasting labs prior...lipids, CMET Dx 272.0 Prescriptions: FLUTICASONE PROPIONATE 50 MCG/ACT SUSP (FLUTICASONE PROPIONATE) 2 sprays per nostril daily  #1 x 11   Entered and Authorized by:   Kerby Nora MD   Signed by:   Kerby Nora MD on 12/19/2009   Method used:   Electronically to        [facility] (retail)       [address]       [place], [place]  [address]       Ph: [phone]       Fax: [phone]   RxID:   [id number]   Current Allergies (reviewed today): ! ERYTHROMYCIN ! CODEINE

## 2011-02-05 ENCOUNTER — Encounter: Payer: Self-pay | Admitting: *Deleted

## 2011-02-12 ENCOUNTER — Ambulatory Visit (INDEPENDENT_AMBULATORY_CARE_PROVIDER_SITE_OTHER): Payer: Medicare Other | Admitting: Family Medicine

## 2011-02-12 ENCOUNTER — Ambulatory Visit: Payer: Self-pay | Admitting: Family Medicine

## 2011-02-12 ENCOUNTER — Encounter: Payer: Self-pay | Admitting: Family Medicine

## 2011-02-12 VITALS — BP 110/62 | HR 64 | Temp 98.4°F | Ht 61.0 in | Wt 136.5 lb

## 2011-02-12 DIAGNOSIS — E7800 Pure hypercholesterolemia, unspecified: Secondary | ICD-10-CM

## 2011-02-12 DIAGNOSIS — I10 Essential (primary) hypertension: Secondary | ICD-10-CM

## 2011-02-12 DIAGNOSIS — R1310 Dysphagia, unspecified: Secondary | ICD-10-CM

## 2011-02-12 MED ORDER — AMLODIPINE BESYLATE 2.5 MG PO TABS: 2.5000 mg | ORAL_TABLET | Freq: Every day | ORAL | Status: DC

## 2011-02-12 NOTE — Assessment & Plan Note (Signed)
Has tried to improve diet and continues to exercise as tolerated.  Last check 07/2010.... LDL slightly above goal. Refuses statin medication.

## 2011-02-12 NOTE — Patient Instructions (Addendum)
We will try lower dose of amlodipine to see if improved side effects.  Continue to limit anti-inflammatories.  Work on low fat diet, exercise as tolerated.

## 2011-02-12 NOTE — Progress Notes (Signed)
  Subjective:    Patient ID: Courtney Chandler, female    DOB: 09/30/1941, 70 y.o.   MRN: [id number]  HPI  Here for follow up.  HTN well controlled at home  On maxide, amlodipine. Concerned that amlodipine may be contributing to body pain, peripheral swelling.  Continues with dysphagia... From a structural issue.  Chronic neck and low back pain: Improved some .Marland Kitchen Has been able to decrease motrin some.   Post polio: continued slow decline in strength. Wears brace on right leg.    Review of Systems  Constitutional: Negative for fever and fatigue.  HENT: Negative for ear pain, nosebleeds and congestion.   Eyes: Negative for pain.  Respiratory: Positive for choking. Negative for chest tightness, shortness of breath and wheezing.        Chronic  Cardiovascular: Negative for chest pain, palpitations and leg swelling.  Gastrointestinal: Positive for diarrhea and constipation. Negative for abdominal pain and blood in stool.       Chronic motility issues  Genitourinary: Negative for dysuria.  Musculoskeletal: Positive for back pain and gait problem.       Objective:   Physical Exam  Constitutional: Vital signs are normal. She appears well-developed and well-nourished. She is cooperative.  Non-toxic appearance. She does not appear ill. No distress.  HENT:  Head: Normocephalic.  Right Ear: Hearing, tympanic membrane, external ear and ear canal normal. Tympanic membrane is not erythematous, not retracted and not bulging.  Left Ear: Hearing, tympanic membrane, external ear and ear canal normal. Tympanic membrane is not erythematous, not retracted and not bulging.  Nose: Right sinus exhibits no maxillary sinus tenderness and no frontal sinus tenderness. Left sinus exhibits no maxillary sinus tenderness and no frontal sinus tenderness.  Mouth/Throat: Uvula is midline, oropharynx is clear and moist and mucous membranes are normal.  Eyes: Conjunctivae, EOM and lids are normal. Pupils are equal,  round, and reactive to light. No foreign bodies found.  Neck: Trachea normal. Spinous process tenderness and muscular tenderness present. Carotid bruit is not present. Decreased range of motion present. No mass and no thyromegaly present.  Cardiovascular: Normal rate, regular rhythm, S1 normal, S2 normal, normal heart sounds, intact distal pulses and normal pulses.  Exam reveals no gallop and no friction rub.   No murmur heard. Pulmonary/Chest: Effort normal and breath sounds normal. Not tachypneic. No respiratory distress. She has no decreased breath sounds. She has no wheezes. She has no rhonchi. She has no rales.  Musculoskeletal:       Brace on right leg  Neurological: She is alert.  Skin: Skin is warm, dry and intact. No rash noted.  Psychiatric: Her speech is normal and behavior is normal. Judgment normal. Her mood appears not anxious. Cognition and memory are normal. She does not exhibit a depressed mood.          Assessment & Plan:

## 2011-02-12 NOTE — Assessment & Plan Note (Signed)
Gradually worsening..due to structural issue of cervical spine..the patient was told in past, progressive wear on esophagus would continue to affect swallowing. She does not wish further eval.

## 2011-02-12 NOTE — Assessment & Plan Note (Signed)
Very well controlled... Will try trial of lower amlodipine dose given pt SE and well controlled/low BP.

## 2011-03-12 ENCOUNTER — Other Ambulatory Visit: Payer: Self-pay | Admitting: Family Medicine

## 2011-04-16 ENCOUNTER — Other Ambulatory Visit: Payer: Self-pay | Admitting: Family Medicine

## 2011-05-09 ENCOUNTER — Other Ambulatory Visit: Payer: Self-pay | Admitting: Family Medicine

## 2011-05-16 ENCOUNTER — Other Ambulatory Visit: Payer: Self-pay | Admitting: Family Medicine

## 2011-07-01 ENCOUNTER — Telehealth: Payer: Self-pay | Admitting: Family Medicine

## 2011-07-02 ENCOUNTER — Telehealth: Payer: Self-pay | Admitting: *Deleted

## 2011-07-02 NOTE — Telephone Encounter (Signed)
Pt would like order for new CPAP machine and supplies to take to [facility].  She says her old machine is taped up and has mold in it.  [facility] requires diagnosis code and copy of her last sleep study.  She has brought in paper work for your review, from when she was first diagnosed as needing a CPAP.  Information is on your desk.

## 2011-07-03 NOTE — Telephone Encounter (Signed)
Pt called back, she has been informed by [facility] that she will need a face to face meeting with you to discuss CPAP before medicare will pay for it.  Pt has scheduled appt for 07/11/11.

## 2011-07-04 NOTE — Telephone Encounter (Signed)
Paperwork placed in [facility] yellow folder for storage until appt.

## 2011-07-11 ENCOUNTER — Encounter: Payer: Self-pay | Admitting: Family Medicine

## 2011-07-11 ENCOUNTER — Ambulatory Visit (INDEPENDENT_AMBULATORY_CARE_PROVIDER_SITE_OTHER): Payer: Medicare Other | Admitting: Family Medicine

## 2011-07-11 DIAGNOSIS — G4730 Sleep apnea, unspecified: Secondary | ICD-10-CM

## 2011-07-11 DIAGNOSIS — I10 Essential (primary) hypertension: Secondary | ICD-10-CM

## 2011-07-11 NOTE — Telephone Encounter (Signed)
Call from [place]anna at [facility]  She is asking if you want a titration study or an auto titration machine, because there is an order in the paperwork for titration.  They want to know what specifically you want.  Please advise.  Call back is [phone].

## 2011-07-11 NOTE — Telephone Encounter (Signed)
Auto titration machine... I wrote the prescription the same as her last one from 2006.  They can verify with patient as to type she has now and is requesting.

## 2011-07-11 NOTE — Assessment & Plan Note (Signed)
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 Sleep study from 2006 likely stable given source of sleep apnea. No clear need for repeat of test. Prescription for CPAP supplies and new CPAP given.

## 2011-07-11 NOTE — Progress Notes (Signed)
  Subjective:    Patient ID: Courtney Chandler, female    DOB: 09-02-1941, 70 y.o.   MRN: [id number]  HPI  70 year old female presents for discussion of CPAP renewal.   She has a copy of her last CPAP report showing severe sleep apnea from 09/2005  He current CPAP is 70 years old (09/18/2005).. She had wanted replacement parts because tubing has mold, using duck tape on her mask. She cannot get new parts given type of machine.  She has been instructed to obtain a new machine at this time.  She has not had any significant weight gain or weight loss since last CPAP. She has a history of post polio syndrome... Her sleep apnea is likely neurologic and central in origin given she has an open oropharynx and  Long thin neck.       Review of Systems  Constitutional: Negative for fever and fatigue.  HENT: Positive for congestion and sinus pressure. Negative for rhinorrhea and neck pain.        She is concern this may be due to mold in CPAP machine  Eyes: Negative for pain.  Respiratory: Negative for cough, shortness of breath and wheezing.   Cardiovascular: Negative for chest pain, palpitations and leg swelling.       Objective:   Physical Exam  Constitutional: Vital signs are normal. She appears well-developed and well-nourished. She is cooperative.  Non-toxic appearance. She does not appear ill. No distress.  HENT:  Head: Normocephalic.  Right Ear: Hearing, tympanic membrane, external ear and ear canal normal. Tympanic membrane is not erythematous, not retracted and not bulging.  Left Ear: Hearing, tympanic membrane, external ear and ear canal normal. Tympanic membrane is not erythematous, not retracted and not bulging.  Nose: No mucosal edema or rhinorrhea. Right sinus exhibits no maxillary sinus tenderness and no frontal sinus tenderness. Left sinus exhibits no maxillary sinus tenderness and no frontal sinus tenderness.  Mouth/Throat: Uvula is midline, oropharynx is clear and moist and mucous  membranes are normal. No oropharyngeal exudate.       Open oropharynx.  Eyes: Conjunctivae, EOM and lids are normal. Pupils are equal, round, and reactive to light. No foreign bodies found.  Neck: Trachea normal and normal range of motion. Neck supple. Carotid bruit is not present. No mass and no thyromegaly present.  Cardiovascular: Normal rate, regular rhythm, S1 normal, S2 normal, normal heart sounds, intact distal pulses and normal pulses.  Exam reveals no gallop and no friction rub.   No murmur heard. Pulmonary/Chest: Effort normal and breath sounds normal. Not tachypneic. No respiratory distress. She has no decreased breath sounds. She has no wheezes. She has no rhonchi. She has no rales.  Abdominal: Soft. Normal appearance and bowel sounds are normal. There is no tenderness.  Neurological: She is alert.  Skin: Skin is warm, dry and intact. No rash noted.  Psychiatric: Her speech is normal and behavior is normal. Judgment and thought content normal. Her mood appears not anxious. Cognition and memory are normal. She does not exhibit a depressed mood.          Assessment & Plan:

## 2011-07-11 NOTE — Assessment & Plan Note (Signed)
Well controlled. Continue current medication.  

## 2011-07-11 NOTE — Patient Instructions (Signed)
Follow up as previously scheduled

## 2011-07-11 NOTE — Telephone Encounter (Signed)
Stacy advised at [facility]

## 2011-07-18 ENCOUNTER — Telehealth: Payer: Self-pay | Admitting: Family Medicine

## 2011-07-18 NOTE — Telephone Encounter (Signed)
Misty Stanley with [facility] requesting call back regarding sleep study at [phone].

## 2011-07-18 NOTE — Telephone Encounter (Signed)
Spoke with stacey and fax we sent over she could not read so i mailed a copy to her

## 2011-08-08 ENCOUNTER — Other Ambulatory Visit: Payer: Self-pay | Admitting: Family Medicine

## 2011-08-16 ENCOUNTER — Other Ambulatory Visit: Payer: Medicare Other

## 2011-08-23 ENCOUNTER — Encounter: Payer: Medicare Other | Admitting: Family Medicine

## 2011-09-16 ENCOUNTER — Telehealth: Payer: Self-pay | Admitting: Family Medicine

## 2011-09-16 DIAGNOSIS — E739 Lactose intolerance, unspecified: Secondary | ICD-10-CM

## 2011-09-16 DIAGNOSIS — E876 Hypokalemia: Secondary | ICD-10-CM

## 2011-09-16 DIAGNOSIS — M949 Disorder of cartilage, unspecified: Secondary | ICD-10-CM

## 2011-09-16 DIAGNOSIS — E7800 Pure hypercholesterolemia, unspecified: Secondary | ICD-10-CM

## 2011-09-16 NOTE — Telephone Encounter (Signed)
Message copied by Excell Seltzer on Mon Sep 16, 2011 11:29 AM ------      Message from: Alvina Chou      Created: Fri Sep 13, 2011  3:46 PM      Regarding: Labs for Tuesday, 11-6       Patient is scheduled for CPX labs, please order future labs, Thanks , Camelia Eng

## 2011-09-17 ENCOUNTER — Other Ambulatory Visit (INDEPENDENT_AMBULATORY_CARE_PROVIDER_SITE_OTHER): Payer: Medicare Other

## 2011-09-17 DIAGNOSIS — E7800 Pure hypercholesterolemia, unspecified: Secondary | ICD-10-CM

## 2011-09-17 DIAGNOSIS — E876 Hypokalemia: Secondary | ICD-10-CM

## 2011-09-17 DIAGNOSIS — M899 Disorder of bone, unspecified: Secondary | ICD-10-CM

## 2011-09-17 DIAGNOSIS — E739 Lactose intolerance, unspecified: Secondary | ICD-10-CM

## 2011-09-17 DIAGNOSIS — M949 Disorder of cartilage, unspecified: Secondary | ICD-10-CM

## 2011-09-17 LAB — COMPREHENSIVE METABOLIC PANEL
ALT: 29 U/L (ref 0–35)
AST: 21 U/L (ref 0–37)
Albumin: 4 g/dL (ref 3.5–5.2)
BUN: 17 mg/dL (ref 6–23)
CO2: 26 mEq/L (ref 19–32)
Calcium: 9.8 mg/dL (ref 8.4–10.5)
Chloride: 105 mEq/L (ref 96–112)
Creatinine, Ser: 1 mg/dL (ref 0.4–1.2)
GFR: 59.75 mL/min — ABNORMAL LOW (ref >60.00)
Potassium: 3.8 mEq/L (ref 3.5–5.1)

## 2011-09-17 LAB — LIPID PANEL
HDL: 73.6 mg/dL (ref >39.00)
Triglycerides: 193 mg/dL — ABNORMAL HIGH (ref 0.0–149.0)

## 2011-09-17 LAB — LDL CHOLESTEROL, DIRECT: Direct LDL: 136.1 mg/dL

## 2011-09-27 ENCOUNTER — Ambulatory Visit (INDEPENDENT_AMBULATORY_CARE_PROVIDER_SITE_OTHER): Payer: Medicare Other | Admitting: Family Medicine

## 2011-09-27 ENCOUNTER — Encounter: Payer: Self-pay | Admitting: Family Medicine

## 2011-09-27 VITALS — BP 110/60 | HR 84 | Temp 98.0°F | Ht 61.0 in | Wt 137.0 lb

## 2011-09-27 DIAGNOSIS — E7800 Pure hypercholesterolemia, unspecified: Secondary | ICD-10-CM

## 2011-09-27 DIAGNOSIS — Z0000 Encounter for general adult medical examination without abnormal findings: Secondary | ICD-10-CM

## 2011-09-27 DIAGNOSIS — Z23 Encounter for immunization: Secondary | ICD-10-CM

## 2011-09-27 DIAGNOSIS — R1310 Dysphagia, unspecified: Secondary | ICD-10-CM

## 2011-09-27 DIAGNOSIS — I10 Essential (primary) hypertension: Secondary | ICD-10-CM

## 2011-09-27 NOTE — Assessment & Plan Note (Addendum)
Inadequate control, trig up and LDL above goal slightly 130... Pt refuses medication. She will work on lifestyle change. Recheck in 1 year.

## 2011-09-27 NOTE — Progress Notes (Signed)
Subjective:    Patient ID: Courtney Chandler, female    DOB: 12-Jan-1941, 70 y.o.   MRN: [id number]  HPI  I have personally reviewed the Medicare Annual Wellness questionnaire and have noted 1. The patient's medical and social history 2. Their use of alcohol, tobacco or illicit drugs 3. Their current medications and supplements 4. The patient's functional ability including ADL's, fall risks, home safety risks and hearing or visual             impairment. 5. Diet and physical activities 6. Evidence for depression or mood disorders The patients weight, height, BMI and visual acuity have been recorded in the chart I have made referrals, counseling and provided education to the patient based review of the above and I have provided the pt with a written personalized care plan for preventive services.  Hypertension:   Well controlled on amlodipine and maxide.  Using medication without problems or lightheadedness:  Chest pain with exertion:None Edema:None Short of breath:stable Average home BPs: 120/70.Marland Kitchen occ 180 sys if under stress... Always goes down when calmer. Not interested in any anxiety med. Other issues:  High cholesterol..LDL elevated some above goal LDL <130.  Triglycerides elevated above goal. Red yeast rice causes constipation.  Not interested in statin meds. Diet compliance: Moderate.. A lot of stress recently. Exercise: walking some , limited with polio and arthritis Other complaints:  Sleep apnea...she feels much better on adjusted CPAP. She is thinking more clearly. No further palpitations.  Much better energy.   Hiatal hernia, dysmotiity, chronic .Marland Kitchen She reports choking has gotten worse. Eating multiple small meals a day.  Chronic mucus production  Chronic post polio syndrome...has chronic pain.    Review of Systems  Constitutional: Negative for fever, fatigue and unexpected weight change.  HENT: Negative for ear pain, congestion, sore throat, sneezing, trouble  swallowing and sinus pressure.   Eyes: Negative for pain and itching.  Respiratory: Negative for cough, shortness of breath and wheezing.   Cardiovascular: Negative for chest pain, palpitations and leg swelling.  Gastrointestinal: Positive for nausea, constipation and abdominal distention. Negative for abdominal pain, diarrhea and blood in stool.  Genitourinary: Negative for dysuria, hematuria, vaginal discharge, difficulty urinating and menstrual problem.  Skin: Negative for rash.  Neurological: Negative for syncope, weakness, light-headedness, numbness and headaches.  Psychiatric/Behavioral: Negative for confusion and dysphoric mood. The patient is not nervous/anxious.        Objective:   Physical Exam  Constitutional: Vital signs are normal. She appears well-developed and well-nourished. She is cooperative.  Non-toxic appearance. She does not appear ill. No distress.  HENT:  Head: Normocephalic.  Right Ear: Hearing, tympanic membrane, external ear and ear canal normal. Tympanic membrane is not erythematous, not retracted and not bulging.  Left Ear: Hearing, tympanic membrane, external ear and ear canal normal. Tympanic membrane is not erythematous, not retracted and not bulging.  Nose: No mucosal edema or rhinorrhea. Right sinus exhibits no maxillary sinus tenderness and no frontal sinus tenderness. Left sinus exhibits no maxillary sinus tenderness and no frontal sinus tenderness.  Mouth/Throat: Uvula is midline, oropharynx is clear and moist and mucous membranes are normal.  Eyes: Conjunctivae, EOM and lids are normal. Pupils are equal, round, and reactive to light. No foreign bodies found.  Neck: Trachea normal and normal range of motion. Carotid bruit is not present. Rigidity present. No mass and no thyromegaly present.       Anterior neck right trachea seems more prominent than on last check.  Cardiovascular:  Normal rate, regular rhythm, S1 normal, S2 normal, normal heart sounds,  intact distal pulses and normal pulses.  Exam reveals no gallop and no friction rub.   No murmur heard. Pulmonary/Chest: Effort normal and breath sounds normal. Not tachypneic. No respiratory distress. She has no decreased breath sounds. She has no wheezes. She has no rhonchi. She has no rales.  Abdominal: Soft. Normal appearance and bowel sounds are normal. There is no tenderness.  Neurological: She is alert.  Skin: Skin is warm, dry and intact. No rash noted.  Psychiatric: She has a normal mood and affect. Her speech is normal and behavior is normal. Judgment and thought content normal. Her mood appears not anxious. Cognition and memory are normal. She does not exhibit a depressed mood.          Assessment & Plan:  Annual Medicare Wellness:  The patient's preventative maintenance and recommended screening tests for an annual wellness exam were reviewed in full today. Brought up to date unless services declined.  Counselled on the importance of diet, exercise, and its role in overall health and mortality. The patient's FH and SH was reviewed, including their home life, tobacco status, and drug and alcohol status.   Vaccines:Uptodate PNA, Td, shingles, given flu today Mammogram: not interested this year, refuses breast exam as well. DXA; 2009 osteopenia. Vit D, not interested this year.. Consider next year. Colon: last done in 2003, repeat in 2013 DVE/pap:pap not indicated.. No family history of uterine or ovarian cancer, asymptomatic.  Does not wish to continue DVEs.

## 2011-09-27 NOTE — Patient Instructions (Addendum)
As able.. Work on low fat diet as tolerated. Work on exercise as tolerated. Fish oil 2000 mg divided daily. Lemon or orange flavor.

## 2011-09-27 NOTE — Assessment & Plan Note (Signed)
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

## 2011-09-27 NOTE — Assessment & Plan Note (Signed)
Well controlled. Continue current medication.  

## 2011-10-29 ENCOUNTER — Other Ambulatory Visit: Payer: Self-pay | Admitting: Family Medicine

## 2011-11-14 ENCOUNTER — Other Ambulatory Visit: Payer: Self-pay | Admitting: Family Medicine

## 2011-11-21 DIAGNOSIS — H4010X0 Unspecified open-angle glaucoma, stage unspecified: Secondary | ICD-10-CM | POA: Diagnosis not present

## 2011-12-23 DIAGNOSIS — H4010X0 Unspecified open-angle glaucoma, stage unspecified: Secondary | ICD-10-CM | POA: Diagnosis not present

## 2012-01-30 ENCOUNTER — Other Ambulatory Visit: Payer: Self-pay | Admitting: Family Medicine

## 2012-02-04 ENCOUNTER — Other Ambulatory Visit: Payer: Self-pay | Admitting: Family Medicine

## 2012-04-23 ENCOUNTER — Other Ambulatory Visit: Payer: Self-pay | Admitting: Family Medicine

## 2012-04-24 DIAGNOSIS — H4010X0 Unspecified open-angle glaucoma, stage unspecified: Secondary | ICD-10-CM | POA: Diagnosis not present

## 2012-05-05 ENCOUNTER — Other Ambulatory Visit: Payer: Self-pay | Admitting: Family Medicine

## 2012-05-08 ENCOUNTER — Ambulatory Visit (INDEPENDENT_AMBULATORY_CARE_PROVIDER_SITE_OTHER): Payer: Medicare Other | Admitting: Family Medicine

## 2012-05-08 ENCOUNTER — Telehealth: Payer: Self-pay | Admitting: Family Medicine

## 2012-05-08 ENCOUNTER — Encounter: Payer: Self-pay | Admitting: Family Medicine

## 2012-05-08 VITALS — BP 146/86 | HR 90 | Temp 98.3°F | Wt 136.5 lb

## 2012-05-08 DIAGNOSIS — J45909 Unspecified asthma, uncomplicated: Secondary | ICD-10-CM

## 2012-05-08 DIAGNOSIS — J45901 Unspecified asthma with (acute) exacerbation: Secondary | ICD-10-CM

## 2012-05-08 MED ORDER — PREDNISONE 10 MG PO TABS: ORAL_TABLET | ORAL | Status: DC

## 2012-05-08 NOTE — Progress Notes (Signed)
  Subjective:    Patient ID: Courtney Chandler, female    DOB: 1941-01-04, 71 y.o.   MRN: [id number]  HPI  71 year old female with history of moderate persistant asthma was started on BBlocker for her glaucoma by Eye MD. She had resulting increasing in SOB. She stopped BBlocker 3 weeks ago but has continued to have issues. Has noted mild improvement but continued symptoms. She has dry cough, chest tightness, ache in back and intercostal muscles. Constant, but worse at night. Interfering with sleep.  She has tried her albuterol prn but it is not helping much. Using 2-4 puffs every 6-8 hours.  No fever. No URI symptoms,  She also continues singulair and advair diskus.  Review of Systems  Constitutional: Positive for fatigue. Negative for fever.  HENT: Negative for ear pain.   Eyes: Negative for pain.  Cardiovascular: Negative for palpitations and leg swelling.  Gastrointestinal: Negative for abdominal pain.       Objective:   Physical Exam  Constitutional: Vital signs are normal. She appears well-developed and well-nourished. She is cooperative.  Non-toxic appearance. She does not appear ill. No distress.       Talking in complete sentences  HENT:  Head: Normocephalic.  Right Ear: Hearing, tympanic membrane, external ear and ear canal normal. Tympanic membrane is not erythematous, not retracted and not bulging.  Left Ear: Hearing, tympanic membrane, external ear and ear canal normal. Tympanic membrane is not erythematous, not retracted and not bulging.  Nose: No mucosal edema or rhinorrhea. Right sinus exhibits no maxillary sinus tenderness and no frontal sinus tenderness. Left sinus exhibits no maxillary sinus tenderness and no frontal sinus tenderness.  Mouth/Throat: Uvula is midline, oropharynx is clear and moist and mucous membranes are normal.  Eyes: Conjunctivae, EOM and lids are normal. Pupils are equal, round, and reactive to light. No foreign bodies found.  Neck: Trachea normal and  normal range of motion. Neck supple. Carotid bruit is not present. No mass and no thyromegaly present.  Cardiovascular: Normal rate, regular rhythm, S1 normal, S2 normal, normal heart sounds, intact distal pulses and normal pulses.  Exam reveals no gallop and no friction rub.   No murmur heard. Pulmonary/Chest: Effort normal and breath sounds normal. Not tachypneic. No respiratory distress. She has no decreased breath sounds. She has no wheezes. She has no rhonchi. She has no rales.       Pt is taking deep breaths and swallowing a lot during exam  Abdominal: Soft. Normal appearance and bowel sounds are normal. There is no tenderness.  Neurological: She is alert.  Skin: Skin is warm, dry and intact. No rash noted.  Psychiatric: Her speech is normal and behavior is normal. Judgment and thought content normal. Her mood appears not anxious. Cognition and memory are normal. She does not exhibit a depressed mood.          Assessment & Plan:

## 2012-05-08 NOTE — Assessment & Plan Note (Signed)
Continue advair, singulair and albuterol prn.

## 2012-05-08 NOTE — Patient Instructions (Signed)
Peak flow diminished at 370, predicted 410. Will treat with low dose course of prednsione taper. Contnue advair, singulair and use albuterol prn. Go to ER if severe shortness of breath. Follow up early next week  if not improving as expected over weekend.

## 2012-05-08 NOTE — Telephone Encounter (Signed)
Please work pt in today.. Let her know there may be a wait.

## 2012-05-08 NOTE — Assessment & Plan Note (Addendum)
No clear sign of infection. Likely due to BBlocker.  Peak flow diminished at 370, predicted 410. Will treat with low dose course of prednsione taper. Follow up early next week  if not improving as expected over weekend.

## 2012-05-08 NOTE — Telephone Encounter (Signed)
Patient said she was put on beta blockers for her glaucoma which affected her asthma.  Patient has been off the beta blockers for three weeks.  Pt is having trouble with her asthma.  Patient said it wakes her up at night and the asthma got worse yesterday.  Patient can hear crackles in her lungs.  Patient only wants to see Dr.Bedsole and the next available appointment is on 05/15/12.  Patient's inhaler isn't working.  Can patient be fit in today?

## 2012-05-08 NOTE — Telephone Encounter (Signed)
Appt scheduled. Patient aware she may have to wait.

## 2012-05-11 ENCOUNTER — Telehealth: Payer: Self-pay | Admitting: Family Medicine

## 2012-05-11 NOTE — Telephone Encounter (Signed)
Called her. Some motility and GI issues.  Decreased pred to 10 mg and was able to tolerate that today. Rec. Cont with 10 mg for 5 days, if not improving with lungs by Wed, come in and we could give her an IM injection

## 2012-05-11 NOTE — Telephone Encounter (Signed)
Caller: Courtney Chandler/Patient; PCP: Excell Seltzer.; CB#: [phone];  Call regarding Asthma and Prednisone;  Prednisone was started on 05/08/12; she was on Prednisone 30mg  over the weekend and she is to started Prednisone 20mg  today;  she started with severe abdominal pain at 0100 today 05/11/12 and the pain was so severe that it woke her up; pain lasted until 0400;  no vomiting or diarrhea; she  has a hx of motility issues; pt is questioning if she should continue with the Prednisone d/t the abdominal pain; breathing/asthma issues are better but not completely cleared; Triaged per Medication Questions-Adult Guideline; Speak to provider within 24 hr d/t older adult with questions about medications not covered by available resources;  OFFICE PLEASE REVIEW AND CALL PT BACK WITH FURTHER INSTRUCTIONS WITH THE PREDNISONE

## 2012-05-13 ENCOUNTER — Encounter: Payer: Self-pay | Admitting: Family Medicine

## 2012-05-13 ENCOUNTER — Ambulatory Visit (INDEPENDENT_AMBULATORY_CARE_PROVIDER_SITE_OTHER): Payer: Medicare Other | Admitting: Family Medicine

## 2012-05-13 VITALS — BP 140/78 | HR 88 | Temp 98.6°F | Ht 61.0 in | Wt 135.8 lb

## 2012-05-13 DIAGNOSIS — J45901 Unspecified asthma with (acute) exacerbation: Secondary | ICD-10-CM

## 2012-05-13 DIAGNOSIS — B370 Candidal stomatitis: Secondary | ICD-10-CM | POA: Diagnosis not present

## 2012-05-13 MED ORDER — DEXAMETHASONE SODIUM PHOSPHATE 10 MG/ML IJ SOLN
10.0000 mg | Freq: Once | INTRAMUSCULAR | Status: AC
  Administered 2012-05-13: 10 mg via INTRAMUSCULAR

## 2012-05-13 MED ORDER — NYSTATIN 100000 UNIT/ML MT SUSP: 500000.0000 [IU] | Freq: Four times a day (QID) | OROMUCOSAL | Status: AC

## 2012-05-13 NOTE — Assessment & Plan Note (Signed)
Nystatin swish. 

## 2012-05-13 NOTE — Patient Instructions (Signed)
Take prednsione 10 mg daily until gone.  If not improving...then we will consider trying increase in dose of Advair inhaler temporarily to 500/50. If severe shortness of breath, go to ER.  Nystatin swish for thrush.

## 2012-05-13 NOTE — Progress Notes (Signed)
  Subjective:    Patient ID: Courtney Chandler, female    DOB: 24-Jan-1941, 71 y.o.   MRN: [id number]  HPI  71 year old female returns for follow up on  Non infectious asthma exacerbation due to Bblocker.  She reports she had GI SE ( abdominal cramping) to prednisone so had to decrease to 10 mg daily. Took for the last 2 days.  She reports that her breathing has improved some , but 50-60 % initially, but she reports that with lowering prednsione chest tightness returned. She felt some lightheadedness this AM, felt nausea and sweaty.   She returns today as Dr. Patsy Lager recommended injected steroid shot.  This would avoid the I tract.  She has noted gums sore in last few days. White plaque on gunm edges.  Review of Systems  Constitutional: Negative for fever and fatigue.  HENT: Negative for ear pain.   Eyes: Negative for pain.  Respiratory: Positive for shortness of breath and wheezing. Negative for chest tightness.   Cardiovascular: Negative for chest pain, palpitations and leg swelling.  Gastrointestinal: Negative for abdominal pain.  Genitourinary: Negative for dysuria.       Objective:   Physical Exam  Constitutional: Vital signs are normal. She appears well-developed and well-nourished. She is cooperative.  Non-toxic appearance. She does not appear ill. No distress.  HENT:  Head: Normocephalic.  Right Ear: Hearing, tympanic membrane, external ear and ear canal normal. Tympanic membrane is not erythematous, not retracted and not bulging.  Left Ear: Hearing, tympanic membrane, external ear and ear canal normal. Tympanic membrane is not erythematous, not retracted and not bulging.  Nose: No mucosal edema or rhinorrhea. Right sinus exhibits no maxillary sinus tenderness and no frontal sinus tenderness. Left sinus exhibits no maxillary sinus tenderness and no frontal sinus tenderness.  Mouth/Throat: Uvula is midline, oropharynx is clear and moist and mucous membranes are normal. Oral lesions  present.       White plaque on gum line on left   Eyes: Conjunctivae, EOM and lids are normal. Pupils are equal, round, and reactive to light. No foreign bodies found.  Neck: Trachea normal and normal range of motion. Neck supple. Carotid bruit is not present. No mass and no thyromegaly present.  Cardiovascular: Normal rate, regular rhythm, S1 normal, S2 normal, normal heart sounds, intact distal pulses and normal pulses.  Exam reveals no gallop and no friction rub.   No murmur heard. Pulmonary/Chest: Effort normal and breath sounds normal. Not tachypneic. No respiratory distress. She has no decreased breath sounds. She has no wheezes. She has no rhonchi. She has no rales.       Improved air movement from last OV.  Abdominal: Soft. Normal appearance and bowel sounds are normal. There is no tenderness.  Neurological: She is alert.  Skin: Skin is warm, dry and intact. No rash noted.  Psychiatric: Her speech is normal and behavior is normal. Judgment and thought content normal. Her mood appears not anxious. Cognition and memory are normal. She does not exhibit a depressed mood.          Assessment & Plan:

## 2012-05-13 NOTE — Assessment & Plan Note (Signed)
Will give injection of kenalog today to bypass Gi tract and to help with exacerbation. She will then take prednsione 10 mg daily until gone.  If not improving...then we will consider trying increase in dose of Advair inhaler temporarily to 500/50.

## 2012-05-15 ENCOUNTER — Ambulatory Visit: Payer: Medicare Other | Admitting: Family Medicine

## 2012-05-21 ENCOUNTER — Telehealth: Payer: Self-pay

## 2012-05-21 MED ORDER — FLUTICASONE-SALMETEROL 500-50 MCG/DOSE IN AEPB: 1.0000 | INHALATION_SPRAY | Freq: Every day | RESPIRATORY_TRACT | Status: DC

## 2012-05-21 NOTE — Telephone Encounter (Signed)
rx called to pharmacy and patient advised 

## 2012-05-21 NOTE — Telephone Encounter (Signed)
Pt seen 05/13/12; pt is better but still SOB pt said SOB not severe but feels like breathing on one side of lungs, pressure feeling is still there. When pt moves about gets lightheaded. Feels best when sitting. Would like increase in Advair as discussed last OV. [facility] [address] Pt does want CMA to call pt when med called in.

## 2012-06-05 DIAGNOSIS — H4010X0 Unspecified open-angle glaucoma, stage unspecified: Secondary | ICD-10-CM | POA: Diagnosis not present

## 2012-06-18 ENCOUNTER — Other Ambulatory Visit: Payer: Self-pay | Admitting: *Deleted

## 2012-06-18 MED ORDER — FLUTICASONE-SALMETEROL 250-50 MCG/DOSE IN AEPB: INHALATION_SPRAY | RESPIRATORY_TRACT | Status: DC

## 2012-06-19 ENCOUNTER — Telehealth: Payer: Self-pay | Admitting: Family Medicine

## 2012-06-19 ENCOUNTER — Telehealth: Payer: Self-pay | Admitting: *Deleted

## 2012-06-19 MED ORDER — FLUTICASONE-SALMETEROL 500-50 MCG/DOSE IN AEPB: 1.0000 | INHALATION_SPRAY | Freq: Two times a day (BID) | RESPIRATORY_TRACT | Status: DC

## 2012-06-19 NOTE — Telephone Encounter (Signed)
Please refill Advair 500/50, 1 puff BID 5 refills  Please apologize, this seems like an error, and she should be taking it twice a day. Please do ASAP for this patient.

## 2012-06-19 NOTE — Telephone Encounter (Signed)
Taken care of, fixed script

## 2012-06-19 NOTE — Telephone Encounter (Signed)
Caller: Nikayla/Patient; Patient Name: Courtney Chandler; PCP: Excell Seltzer.; Best Callback Phone Number: [phone].  She states she is having difficulty with her Advair prescription.  She states she was PREVIOUSLEY ON  Advair 250/50mg  bid.  Dr. Pattricia Boss ordered an increase of the medication Advair 500/50mg  bid.  The patient received the medication and has been taking it and it is helping. She went to get a refill and discovered that the medication was ordered incorrectly and written for her to take ONCE DAILY.  The office was called by her pharmacy  to correct the script to BID.  The office called in the medication for Bid but called in the wrong dose this time.  The office called in Advair 250mg /50 bid.  Ms. Tinner is so upset, she states she will be out of the 500/50 Advair this weekend and needs the correct medication and dosage.  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  She is asking to please contact her regarding this medication today.

## 2012-06-19 NOTE — Telephone Encounter (Signed)
Patient advised and rx sent in. 

## 2012-06-19 NOTE — Telephone Encounter (Signed)
Patients advair was increased temporarily to 500-50mg   b/c of asthma but, it was called in for only once a day and patient is out of medication b/c she is using it 2 times daily should we go back to 250-50 or give her a refill on the 500-50 b/c she request this. [facility] [address]

## 2012-07-01 ENCOUNTER — Other Ambulatory Visit: Payer: Self-pay | Admitting: Family Medicine

## 2012-07-02 NOTE — Telephone Encounter (Signed)
Electronic refill request.  Patient last refilled in June.  Please advise.

## 2012-07-07 ENCOUNTER — Ambulatory Visit (INDEPENDENT_AMBULATORY_CARE_PROVIDER_SITE_OTHER): Payer: Medicare Other | Admitting: Family Medicine

## 2012-07-07 ENCOUNTER — Encounter: Payer: Self-pay | Admitting: Family Medicine

## 2012-07-07 VITALS — BP 108/68 | HR 80 | Temp 98.5°F | Ht 61.0 in | Wt 135.8 lb

## 2012-07-07 DIAGNOSIS — R110 Nausea: Secondary | ICD-10-CM

## 2012-07-07 DIAGNOSIS — K449 Diaphragmatic hernia without obstruction or gangrene: Secondary | ICD-10-CM

## 2012-07-07 DIAGNOSIS — R1310 Dysphagia, unspecified: Secondary | ICD-10-CM

## 2012-07-07 DIAGNOSIS — R0789 Other chest pain: Secondary | ICD-10-CM | POA: Diagnosis not present

## 2012-07-07 LAB — COMPREHENSIVE METABOLIC PANEL
CO2: 27 mEq/L (ref 19–32)
Creatinine, Ser: 0.8 mg/dL (ref 0.4–1.2)
GFR: 74.27 mL/min (ref >60.00)
Glucose, Bld: 102 mg/dL — ABNORMAL HIGH (ref 70–99)
Total Bilirubin: 0.7 mg/dL (ref 0.3–1.2)

## 2012-07-07 LAB — CBC WITH DIFFERENTIAL/PLATELET
Basophils Relative: 1 % (ref 0.0–3.0)
Eosinophils Relative: 1.4 % (ref 0.0–5.0)
HCT: 38.5 % (ref 36.0–46.0)
Hemoglobin: 12.8 g/dL (ref 12.0–15.0)
Lymphs Abs: 1.9 10*3/uL (ref 0.7–4.0)
Monocytes Relative: 9.6 % (ref 3.0–12.0)
Neutro Abs: 6.1 10*3/uL (ref 1.4–7.7)
RBC: 4.12 Mil/uL (ref 3.87–5.11)
WBC: 9.1 10*3/uL (ref 4.5–10.5)

## 2012-07-07 MED ORDER — PANTOPRAZOLE SODIUM 40 MG PO TBEC: 40.0000 mg | DELAYED_RELEASE_TABLET | Freq: Every day | ORAL | Status: DC

## 2012-07-07 NOTE — Progress Notes (Signed)
Subjective:    Patient ID: Courtney Chandler, female    DOB: January 19, 1941, 71 y.o.   MRN: [id number]  HPI 39- year old female with history of post polio syndrome and dysphagia/motility issues presents with issues with her hiatal hernia.  She has not been sleeping due to sharp chest pain when lying flat, but occuring some in all positions... She feels her hiatal hernia  (diagnosed by arium swallow several years ago) slipping upward. (No exertional symptoms). No specific acid in her mouth. Pain radiates through to back.  Episodes lasting 16 hours.  This is a chronic issue for her but worse lately in last   Has been eating limited bland diet. Food makes her symptoms worse.  Also having abdominal spasm. Chronic nausea, no diarrhea, no constipation. (did have some diarrhea when she was on BBlockers) Burping a lot, feels abdominal pressure improved with walking and burping.  Not on any reflux medication, tried prilosec x 1-2 months in past did not help with hiatal hernia.  She has never had a endoscopy given the prominent anterior cervical bony growth .Marland Kitchen Per pt a with her last GI in [place].   Some SOB that she associates with her asthma.  Last GI MD she saw was 5-6 years ago.   She feels that her symptoms are worse following short term use of eye drop BBlockers from her eye MD. She has been off now since 11/2011.  Review of Systems  Constitutional: Negative for fever and fatigue.  HENT: Negative for ear pain.   Eyes: Negative for pain.  Respiratory: Positive for shortness of breath. Negative for chest tightness.   Cardiovascular: Positive for chest pain. Negative for palpitations and leg swelling.  Gastrointestinal: Positive for nausea and abdominal distention. Negative for abdominal pain.  Genitourinary: Negative for dysuria.       Objective:   Physical Exam  Constitutional: Vital signs are normal. She appears well-developed and well-nourished. She is cooperative.  Non-toxic appearance.  She does not appear ill. No distress.  HENT:  Head: Normocephalic.  Right Ear: Hearing, tympanic membrane, external ear and ear canal normal. Tympanic membrane is not erythematous, not retracted and not bulging.  Left Ear: Hearing, tympanic membrane, external ear and ear canal normal. Tympanic membrane is not erythematous, not retracted and not bulging.  Nose: No mucosal edema or rhinorrhea. Right sinus exhibits no maxillary sinus tenderness and no frontal sinus tenderness. Left sinus exhibits no maxillary sinus tenderness and no frontal sinus tenderness.  Mouth/Throat: Uvula is midline, oropharynx is clear and moist and mucous membranes are normal.  Eyes: Conjunctivae, EOM and lids are normal. Pupils are equal, round, and reactive to light. No foreign bodies found.  Neck: Trachea normal and normal range of motion. Neck supple. Carotid bruit is not present. No mass and no thyromegaly present.  Cardiovascular: Normal rate, regular rhythm, S1 normal, S2 normal, normal heart sounds, intact distal pulses and normal pulses.  Exam reveals no gallop and no friction rub.   No murmur heard. Pulmonary/Chest: Effort normal and breath sounds normal. Not tachypneic. No respiratory distress. She has no decreased breath sounds. She has no wheezes. She has no rhonchi. She has no rales. She exhibits no tenderness, no bony tenderness and no deformity.  Abdominal: Soft. Normal appearance and bowel sounds are normal. There is no tenderness.  Neurological: She is alert.  Skin: Skin is warm, dry and intact. No rash noted.  Psychiatric: Her speech is normal and behavior is normal. Judgment and thought content normal.  Her mood appears not anxious. Cognition and memory are normal. She does not exhibit a depressed mood.          Assessment & Plan:

## 2012-07-07 NOTE — Assessment & Plan Note (Addendum)
Most suggestive of GI origin as opposed to cardiac or pulmonary. EKG NSR, no specific ST change. Will eval with labs, lipase etc. Start PPI, likely will need referral to GI for further eval. Per pt can only have barium swallow, no endoscopy.  Pt has strong opinions that current symptoms related to BBlocker eye drops she had earlier in the year...causing bloating which has then made hiatal hernia worse. I doscussed with pt that BBlockers typically do not give GI SE.

## 2012-07-07 NOTE — Assessment & Plan Note (Signed)
In past this was listed as small on barium swallow. Pt concerned that it may have increased in size given current symptoms.

## 2012-07-07 NOTE — Patient Instructions (Addendum)
Start pantoprazole 40 mg daily. We will call with lab results. If normal labs will move ahead with GI referral to [facility].

## 2012-07-09 ENCOUNTER — Other Ambulatory Visit: Payer: Self-pay | Admitting: Family Medicine

## 2012-07-09 NOTE — Addendum Note (Signed)
Addended by: Kerby Nora E on: 07/09/2012 09:16 AM   Modules accepted: Orders

## 2012-07-10 ENCOUNTER — Telehealth: Payer: Self-pay | Admitting: Family Medicine

## 2012-07-10 NOTE — Telephone Encounter (Signed)
Called patient to get her the GI Consult, gave her the choices of [facility] in [place] or [facility] in [place]. She said she would not go to [place] as she will not drive there and then she decided that her symptoms have gotten better and she doesn't want the referral at all. Said she would call back if she wants it. I tried to explain that it could take awhile for the appt in [place] and that maybe we should start the process that we could always cancel, but she does not want this referral at all! I will cancel the referral in Epic when you tell me too.

## 2012-07-10 NOTE — Telephone Encounter (Signed)
Go ahead and cancel referral if pt not interested. Not emergent.

## 2012-07-14 ENCOUNTER — Other Ambulatory Visit (INDEPENDENT_AMBULATORY_CARE_PROVIDER_SITE_OTHER): Payer: Medicare Other

## 2012-07-14 DIAGNOSIS — E871 Hypo-osmolality and hyponatremia: Secondary | ICD-10-CM | POA: Diagnosis not present

## 2012-07-14 LAB — SODIUM: Sodium: 134 mEq/L — ABNORMAL LOW (ref 135–145)

## 2012-07-14 MED ORDER — MONTELUKAST SODIUM 10 MG PO TABS: 10.0000 mg | ORAL_TABLET | Freq: Every day | ORAL | Status: DC

## 2012-08-04 ENCOUNTER — Ambulatory Visit (INDEPENDENT_AMBULATORY_CARE_PROVIDER_SITE_OTHER): Payer: Medicare Other

## 2012-08-04 DIAGNOSIS — Z23 Encounter for immunization: Secondary | ICD-10-CM

## 2012-09-02 ENCOUNTER — Other Ambulatory Visit: Payer: Self-pay | Admitting: Family Medicine

## 2012-09-28 ENCOUNTER — Encounter: Payer: Medicare Other | Admitting: Internal Medicine

## 2012-09-29 ENCOUNTER — Encounter: Payer: Self-pay | Admitting: Family Medicine

## 2012-09-29 ENCOUNTER — Ambulatory Visit (INDEPENDENT_AMBULATORY_CARE_PROVIDER_SITE_OTHER): Payer: Medicare Other | Admitting: Family Medicine

## 2012-09-29 VITALS — BP 120/70 | HR 86 | Temp 97.7°F | Ht 61.0 in | Wt 135.5 lb

## 2012-09-29 DIAGNOSIS — E876 Hypokalemia: Secondary | ICD-10-CM

## 2012-09-29 DIAGNOSIS — Z1231 Encounter for screening mammogram for malignant neoplasm of breast: Secondary | ICD-10-CM

## 2012-09-29 DIAGNOSIS — B91 Sequelae of poliomyelitis: Secondary | ICD-10-CM

## 2012-09-29 DIAGNOSIS — M899 Disorder of bone, unspecified: Secondary | ICD-10-CM

## 2012-09-29 DIAGNOSIS — I10 Essential (primary) hypertension: Secondary | ICD-10-CM | POA: Diagnosis not present

## 2012-09-29 DIAGNOSIS — Z0000 Encounter for general adult medical examination without abnormal findings: Secondary | ICD-10-CM

## 2012-09-29 DIAGNOSIS — Z1212 Encounter for screening for malignant neoplasm of rectum: Secondary | ICD-10-CM

## 2012-09-29 DIAGNOSIS — E7800 Pure hypercholesterolemia, unspecified: Secondary | ICD-10-CM

## 2012-09-29 DIAGNOSIS — M949 Disorder of cartilage, unspecified: Secondary | ICD-10-CM

## 2012-09-29 NOTE — Patient Instructions (Addendum)
Return for fasting labs. Stop at front desk to schedule mammogram and bone density.  Stop at lab on your way out to pick up stool test.

## 2012-09-29 NOTE — Assessment & Plan Note (Signed)
Well controlled. Continue current medication.  

## 2012-09-29 NOTE — Assessment & Plan Note (Signed)
Due for DEXA. 

## 2012-09-29 NOTE — Assessment & Plan Note (Signed)
Due for re-eval. On fish oil now.

## 2012-09-29 NOTE — Assessment & Plan Note (Signed)
Improved pain on fish oil.

## 2012-09-29 NOTE — Assessment & Plan Note (Signed)
Due for re-eval. 

## 2012-09-29 NOTE — Progress Notes (Signed)
I have personally reviewed the Medicare Annual Wellness questionnaire and have noted  1. The patient's medical and social history  2. Their use of alcohol, tobacco or illicit drugs  3. Their current medications and supplements  4. The patient's functional ability including ADL's, fall risks, home safety risks and hearing or visual  impairment.  5. Diet and physical activities  6. Evidence for depression or mood disorders  The patients weight, height, BMI and visual acuity have been recorded in the chart  I have made referrals, counseling and provided education to the patient based review of the above and I have provided the pt with a written personalized care plan for preventive services.   Hypertension: Well controlled on amlodipine and maxide.  Using medication without problems or lightheadedness:  Chest pain with exertion:None  Edema:None  Short of breath:stable  Average home BPs:not checking Other issues:   High cholesterol..Due for re-eval.. Not at goal last year. On fish oil in last year... Has helped with post polio pain as well. Lab Results  Component Value Date   CHOL 244* 09/17/2011   HDL 73.60 09/17/2011   LDLCALC 125 04/11/2009   LDLDIRECT 136.1 09/17/2011   TRIG 193.0* 09/17/2011   CHOLHDL 3 09/17/2011  Red yeast rice causes constipation. Not interested in statin meds.  Diet compliance: Improved Exercise: walking 2 times a week , limited with polio and arthritis  Other complaints:   Sleep apnea...she feels much better on adjusted CPAP. She is thinking more clearly.  No further palpitations.  Much better energy.   Hiatal hernia, dysmotiity, chronic .Marland Kitchen She reports choking has gotten worse. Eating multiple small meals a day.   No chest pain she feels diet changes an stopping caffeine have helped this. Chronic mucus production  Chronic post polio syndrome...has chronic pain but this is almost resolved on fish oil.  Review of Systems  Constitutional: Negative for fever,  fatigue and unexpected weight change.  HENT: Negative for ear pain, congestion, sore throat, sneezing, trouble swallowing and sinus pressure.  Eyes: Negative for pain and itching.  Respiratory: Negative for cough, shortness of breath and wheezing.  Cardiovascular: Negative for chest pain, palpitations and leg swelling.  Gastrointestinal: Negative from constipation.Negative for abdominal pain, diarrhea and blood in stool.  Genitourinary: Negative for dysuria, hematuria, vaginal discharge, difficulty urinating and menstrual problem.  Skin: Negative for rash.  Neurological: Negative for syncope, weakness, light-headedness, numbness and headaches.  Psychiatric/Behavioral: Negative for confusion and dysphoric mood. The patient is not nervous/anxious.    Objective:   Physical Exam  Constitutional: Vital signs are normal. She appears well-developed and well-nourished. She is cooperative. Non-toxic appearance. She does not appear ill. No distress.  HENT:  Head: Normocephalic.  Right Ear: Hearing, tympanic membrane, external ear and ear canal normal. Tympanic membrane is not erythematous, not retracted and not bulging.  Left Ear: Hearing, tympanic membrane, external ear and ear canal normal. Tympanic membrane is not erythematous, not retracted and not bulging.  Nose: No mucosal edema or rhinorrhea. Right sinus exhibits no maxillary sinus tenderness and no frontal sinus tenderness. Left sinus exhibits no maxillary sinus tenderness and no frontal sinus tenderness.  Mouth/Throat: Uvula is midline, oropharynx is clear and moist and mucous membranes are normal.  Eyes: Conjunctivae, EOM and lids are normal. Pupils are equal, round, and reactive to light. No foreign bodies found.  Neck: Trachea normal and normal range of motion. Carotid bruit is not present. Rigidity present. No mass and no thyromegaly present.  Anterior neck right  trachea seems more prominent than on last check.  Cardiovascular: Normal  rate, regular rhythm, S1 normal, S2 normal, normal heart sounds, intact distal pulses and normal pulses. Exam reveals no gallop and no friction rub.  No murmur heard.  Pulmonary/Chest: Effort normal and breath sounds normal. Not tachypneic. No respiratory distress. She has no decreased breath sounds. She has no wheezes. She has no rhonchi. She has no rales.  Abdominal: Soft. Normal appearance and bowel sounds are normal. There is no tenderness.  Neurological: She is alert.  Skin: Skin is warm, dry and intact. No rash noted.  Psychiatric: She has a normal mood and affect. Her speech is normal and behavior is normal. Judgment and thought content normal. Her mood appears not anxious. Cognition and memory are normal. She does not exhibit a depressed mood.    Assessment & Plan:   Annual Medicare Wellness:  The patient's preventative maintenance and recommended screening tests for an annual wellness exam were reviewed in full today.  Brought up to date unless services declined.  Counselled on the importance of diet, exercise, and its role in overall health and mortality.  The patient's FH and SH was reviewed, including their home life, tobacco status, and drug and alcohol status.   Vaccines:Uptodate PNA, Td, shingles, given flu previously to today.  Mammogram Will  schedule. DXA; 2009 osteopenia. Vit D We will schedule. Colon: last done in 2003, repeat due  in 2013, she is not interested in repeating ever. Will start ifob. DVE/pap:pap not indicated.. No family history of uterine or ovarian cancer, asymptomatic. Does not wish to continue DVEs.

## 2012-10-01 ENCOUNTER — Other Ambulatory Visit (INDEPENDENT_AMBULATORY_CARE_PROVIDER_SITE_OTHER): Payer: Medicare Other

## 2012-10-01 ENCOUNTER — Encounter: Payer: Self-pay | Admitting: *Deleted

## 2012-10-01 ENCOUNTER — Other Ambulatory Visit: Payer: Self-pay | Admitting: Family Medicine

## 2012-10-01 DIAGNOSIS — E7800 Pure hypercholesterolemia, unspecified: Secondary | ICD-10-CM | POA: Diagnosis not present

## 2012-10-01 DIAGNOSIS — Z1212 Encounter for screening for malignant neoplasm of rectum: Secondary | ICD-10-CM

## 2012-10-01 DIAGNOSIS — M899 Disorder of bone, unspecified: Secondary | ICD-10-CM

## 2012-10-01 LAB — COMPREHENSIVE METABOLIC PANEL
ALT: 18 U/L (ref 0–35)
Albumin: 3.9 g/dL (ref 3.5–5.2)
CO2: 28 mEq/L (ref 19–32)
GFR: 62.5 mL/min (ref >60.00)
Glucose, Bld: 93 mg/dL (ref 70–99)
Potassium: 3.9 mEq/L (ref 3.5–5.1)
Sodium: 138 mEq/L (ref 135–145)
Total Bilirubin: 0.7 mg/dL (ref 0.3–1.2)
Total Protein: 7.6 g/dL (ref 6.0–8.3)

## 2012-10-01 LAB — LIPID PANEL
Total CHOL/HDL Ratio: 3
VLDL: 21.2 mg/dL (ref 0.0–40.0)

## 2012-10-01 LAB — FECAL OCCULT BLOOD, IMMUNOCHEMICAL: Fecal Occult Bld: NEGATIVE

## 2012-10-01 LAB — HM DEXA SCAN

## 2012-10-01 LAB — LDL CHOLESTEROL, DIRECT: Direct LDL: 161.1 mg/dL

## 2012-10-02 ENCOUNTER — Encounter: Payer: Self-pay | Admitting: Family Medicine

## 2012-10-02 DIAGNOSIS — Z1231 Encounter for screening mammogram for malignant neoplasm of breast: Secondary | ICD-10-CM | POA: Diagnosis not present

## 2012-10-02 DIAGNOSIS — E2839 Other primary ovarian failure: Secondary | ICD-10-CM | POA: Diagnosis not present

## 2012-10-02 DIAGNOSIS — N951 Menopausal and female climacteric states: Secondary | ICD-10-CM | POA: Diagnosis not present

## 2012-10-02 DIAGNOSIS — M899 Disorder of bone, unspecified: Secondary | ICD-10-CM | POA: Diagnosis not present

## 2012-10-05 ENCOUNTER — Encounter: Payer: Self-pay | Admitting: *Deleted

## 2012-10-30 ENCOUNTER — Other Ambulatory Visit: Payer: Self-pay | Admitting: Family Medicine

## 2012-11-05 ENCOUNTER — Other Ambulatory Visit: Payer: Self-pay | Admitting: Family Medicine

## 2012-11-29 ENCOUNTER — Other Ambulatory Visit: Payer: Self-pay | Admitting: Family Medicine

## 2012-12-04 DIAGNOSIS — H4010X0 Unspecified open-angle glaucoma, stage unspecified: Secondary | ICD-10-CM | POA: Diagnosis not present

## 2012-12-30 ENCOUNTER — Other Ambulatory Visit: Payer: Self-pay | Admitting: Family Medicine

## 2013-01-01 DIAGNOSIS — H4010X0 Unspecified open-angle glaucoma, stage unspecified: Secondary | ICD-10-CM | POA: Diagnosis not present

## 2013-01-03 ENCOUNTER — Other Ambulatory Visit: Payer: Self-pay | Admitting: Family Medicine

## 2013-01-08 ENCOUNTER — Other Ambulatory Visit: Payer: Self-pay | Admitting: Family Medicine

## 2013-01-24 ENCOUNTER — Other Ambulatory Visit: Payer: Self-pay | Admitting: Family Medicine

## 2013-01-25 ENCOUNTER — Telehealth: Payer: Self-pay

## 2013-01-25 NOTE — Telephone Encounter (Signed)
Pt request verification advair was sent in with instructions twice a day. Verified refill sent q 12 hours. Pt will pick up at [facility].

## 2013-01-29 ENCOUNTER — Other Ambulatory Visit: Payer: Self-pay | Admitting: Family Medicine

## 2013-01-29 MED ORDER — PANTOPRAZOLE SODIUM 40 MG PO TBEC: DELAYED_RELEASE_TABLET | ORAL | Status: DC

## 2013-01-29 MED ORDER — POTASSIUM CHLORIDE CRYS ER 20 MEQ PO TBCR: EXTENDED_RELEASE_TABLET | ORAL | Status: DC

## 2013-02-08 ENCOUNTER — Other Ambulatory Visit: Payer: Self-pay | Admitting: Family Medicine

## 2013-02-08 DIAGNOSIS — H4010X0 Unspecified open-angle glaucoma, stage unspecified: Secondary | ICD-10-CM | POA: Diagnosis not present

## 2013-02-21 ENCOUNTER — Other Ambulatory Visit: Payer: Self-pay | Admitting: Family Medicine

## 2013-02-28 ENCOUNTER — Other Ambulatory Visit: Payer: Self-pay | Admitting: Family Medicine

## 2013-03-05 ENCOUNTER — Other Ambulatory Visit: Payer: Self-pay | Admitting: Family Medicine

## 2013-03-08 ENCOUNTER — Other Ambulatory Visit: Payer: Self-pay | Admitting: Family Medicine

## 2013-03-12 DIAGNOSIS — H4010X0 Unspecified open-angle glaucoma, stage unspecified: Secondary | ICD-10-CM | POA: Diagnosis not present

## 2013-03-24 ENCOUNTER — Other Ambulatory Visit: Payer: Self-pay | Admitting: Family Medicine

## 2013-03-30 ENCOUNTER — Other Ambulatory Visit: Payer: Self-pay | Admitting: Family Medicine

## 2013-04-06 ENCOUNTER — Other Ambulatory Visit: Payer: Self-pay | Admitting: Family Medicine

## 2013-04-12 DIAGNOSIS — H4010X0 Unspecified open-angle glaucoma, stage unspecified: Secondary | ICD-10-CM | POA: Diagnosis not present

## 2013-04-22 ENCOUNTER — Other Ambulatory Visit: Payer: Self-pay | Admitting: Family Medicine

## 2013-04-30 ENCOUNTER — Other Ambulatory Visit: Payer: Self-pay | Admitting: Family Medicine

## 2013-05-04 ENCOUNTER — Other Ambulatory Visit: Payer: Self-pay | Admitting: Family Medicine

## 2013-05-09 ENCOUNTER — Other Ambulatory Visit: Payer: Self-pay | Admitting: Family Medicine

## 2013-05-24 ENCOUNTER — Other Ambulatory Visit: Payer: Self-pay | Admitting: Family Medicine

## 2013-06-07 ENCOUNTER — Other Ambulatory Visit: Payer: Self-pay | Admitting: Family Medicine

## 2013-06-24 ENCOUNTER — Other Ambulatory Visit: Payer: Self-pay | Admitting: Family Medicine

## 2013-07-05 ENCOUNTER — Other Ambulatory Visit: Payer: Self-pay | Admitting: Family Medicine

## 2013-07-07 ENCOUNTER — Other Ambulatory Visit: Payer: Self-pay | Admitting: Family Medicine

## 2013-07-22 ENCOUNTER — Other Ambulatory Visit: Payer: Self-pay | Admitting: Family Medicine

## 2013-07-29 ENCOUNTER — Other Ambulatory Visit: Payer: Self-pay | Admitting: Family Medicine

## 2013-08-05 ENCOUNTER — Other Ambulatory Visit: Payer: Self-pay | Admitting: Family Medicine

## 2013-08-19 DIAGNOSIS — H4010X0 Unspecified open-angle glaucoma, stage unspecified: Secondary | ICD-10-CM | POA: Diagnosis not present

## 2013-08-26 DIAGNOSIS — Z23 Encounter for immunization: Secondary | ICD-10-CM | POA: Diagnosis not present

## 2013-09-01 ENCOUNTER — Other Ambulatory Visit: Payer: Self-pay | Admitting: Family Medicine

## 2013-09-17 ENCOUNTER — Telehealth: Payer: Self-pay | Admitting: Family Medicine

## 2013-09-17 DIAGNOSIS — E7800 Pure hypercholesterolemia, unspecified: Secondary | ICD-10-CM

## 2013-09-17 DIAGNOSIS — M899 Disorder of bone, unspecified: Secondary | ICD-10-CM

## 2013-09-17 NOTE — Telephone Encounter (Signed)
Message copied by Excell Seltzer on Fri Sep 17, 2013  2:03 PM ------      Message from: Alvina Chou      Created: Tue Sep 14, 2013  4:36 PM      Regarding: Lab orders for Friday, 11.14.14       Patient is scheduled for CPX labs, please order future labs, Thanks , Terri       ------

## 2013-09-24 ENCOUNTER — Other Ambulatory Visit: Payer: Medicare Other

## 2013-09-24 ENCOUNTER — Ambulatory Visit (INDEPENDENT_AMBULATORY_CARE_PROVIDER_SITE_OTHER): Payer: Medicare Other | Admitting: Internal Medicine

## 2013-09-24 ENCOUNTER — Encounter: Payer: Self-pay | Admitting: Internal Medicine

## 2013-09-24 ENCOUNTER — Other Ambulatory Visit (INDEPENDENT_AMBULATORY_CARE_PROVIDER_SITE_OTHER): Payer: Medicare Other

## 2013-09-24 VITALS — BP 140/70 | HR 111 | Temp 98.0°F

## 2013-09-24 DIAGNOSIS — W540XXA Bitten by dog, initial encounter: Secondary | ICD-10-CM | POA: Diagnosis not present

## 2013-09-24 DIAGNOSIS — M899 Disorder of bone, unspecified: Secondary | ICD-10-CM | POA: Diagnosis not present

## 2013-09-24 DIAGNOSIS — E7800 Pure hypercholesterolemia, unspecified: Secondary | ICD-10-CM

## 2013-09-24 DIAGNOSIS — T148XXA Other injury of unspecified body region, initial encounter: Secondary | ICD-10-CM

## 2013-09-24 LAB — COMPREHENSIVE METABOLIC PANEL
ALT: 23 U/L (ref 0–35)
Albumin: 3.7 g/dL (ref 3.5–5.2)
CO2: 30 mEq/L (ref 19–32)
Calcium: 10.3 mg/dL (ref 8.4–10.5)
Chloride: 101 mEq/L (ref 96–112)
Creatinine, Ser: 1 mg/dL (ref 0.4–1.2)
GFR: 60.83 mL/min (ref >60.00)
Potassium: 3.9 mEq/L (ref 3.5–5.1)
Total Protein: 7.1 g/dL (ref 6.0–8.3)

## 2013-09-24 LAB — LDL CHOLESTEROL, DIRECT: Direct LDL: 168 mg/dL

## 2013-09-24 LAB — LIPID PANEL
Cholesterol: 278 mg/dL — ABNORMAL HIGH (ref 0–200)
HDL: 73.9 mg/dL (ref >39.00)
VLDL: 28 mg/dL (ref 0.0–40.0)

## 2013-09-24 MED ORDER — AMOXICILLIN-POT CLAVULANATE 875-125 MG PO TABS: 1.0000 | ORAL_TABLET | Freq: Two times a day (BID) | ORAL | Status: DC

## 2013-09-24 NOTE — Patient Instructions (Signed)

## 2013-09-24 NOTE — Progress Notes (Signed)
Subjective:    Patient ID: Courtney Chandler, female    DOB: [birth year], 72 y.o.   MRN: [id number]  HPI  Pt presents to the clinic today with c/o dog bite. This occurred today while she was out walking her dog. She saww a dog on the loose in the neighborhood. It started to attack her dog so she bent down and covered her dog with her body. That is when the dog bite her on the hand. It was not her dog. She does not know the immunization status. Her las tetanus was 2008.  Review of Systems      Past Medical History  Diagnosis Date  . Diabetes mellitus   . Arthritis     osteo of left hip  . Hypertension   . Asthma     moderate persistant  . Allergy   . Anxiety     associated with dyphagia  . 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 Deviated septum     2002    Current Outpatient Prescriptions  Medication Sig Dispense Refill  . ADVAIR DISKUS 500-50 MCG/DOSE AEPB INHALE ONE DOSE BY MOUTH EVERY 12 HOURS  60 each  2  . amLODipine (NORVASC) 2.5 MG tablet TAKE ONE TABLET BY MOUTH ONCE DAILY  90 tablet  0  . brimonidine (ALPHAGAN P) 0.1 % SOLN 2 (two) times daily. Left eye only      . Calcium Carbonate-Vitamin D (CALTRATE 600+D) 600-400 MG-UNIT per tablet Take 1 tablet by mouth daily.        Marland Kitchen Dextromethorphan-Guaifenesin 20-400 MG TABS Take by mouth 4 (four) times daily as needed.        . fish oil-omega-3 fatty acids 1000 MG capsule Take 1 g by mouth 2 (two) times daily.      . Glucosamine-Chondroitin-MSM 500-400-300 MG TABS Take 2 tablets by mouth daily.       Marland Kitchen ibuprofen (ADVIL,MOTRIN) 800 MG tablet Take 400 mg by mouth every 6 (six) hours as needed. Take one by mouth four times a day as needed      . KLOR-CON M20 20 MEQ tablet TAKE TWO TABLETS BY MOUTH ONCE DAILY  60 tablet  4  . latanoprost (XALATAN) 0.005 % ophthalmic solution       . Melatonin 5 MG TABS Take 1 tablet by mouth at bedtime.        . montelukast (SINGULAIR) 10 MG tablet Take 1 tablet  (10 mg total) by mouth at bedtime.  30 tablet  5  . Multiple Vitamin (MULTIVITAMIN) tablet Take 1 tablet by mouth daily.      . pantoprazole (PROTONIX) 40 MG tablet TAKE ONE TABLET BY MOUTH ONCE DAILY  30 tablet  4  . potassium chloride SA (KLOR-CON M20) 20 MEQ tablet TAKE TWO TABLETS BY MOUTH ONCE DAILY  60 tablet  5  . PROAIR HFA 108 (90 BASE) MCG/ACT inhaler INHALE TWO PUFFS BY MOUTH 4 TIMES DAILY  9 each  0  . senna (SENOKOT) 8.6 MG tablet Take 3 tablets by mouth daily.        Marland Kitchen triamterene-hydrochlorothiazide (MAXZIDE) 75-50 MG per tablet TAKE ONE-HALF TABLET BY MOUTH ONCE DAILY  30 tablet  0   No current facility-administered medications for this visit.    Allergies  Allergen Reactions  . Codeine     REACTION: Nausea and vomiting  . Erythromycin     REACTION: Nausea and vomiting    Family History  Problem Relation Age of Onset  . Cancer Mother     lung  . Alcohol abuse Mother   . Diabetes Father   . Alcohol abuse Father   . Cancer Maternal Grandmother     colon  . Cancer Maternal Grandfather     colon  . Diabetes Paternal Grandmother     History   Social History  . Marital Status: Single    Spouse Name: N/A    Number of Children: N/A  . Years of Education: N/A   Occupational History  . disabled     retired Charity fundraiser   Social History Main Topics  . Smoking status: Former Games developer  . Smokeless tobacco: Never Used     Comment: quit over 30 years ago  . Alcohol Use: Yes     Comment: very rare wine  . Drug Use: No  . Sexual Activity: Not on file   Other Topics Concern  . Not on file   Social History Narrative   Exercise -Lifting weights with arms, swimming       Diet healthy      End of Life: has completed MOST.. Wants to be DNR. (reviewed 2013)   Living will in place.     Constitutional: Denies fever, malaise, fatigue, headache or abrupt weight changes.  Skin: Pt reports puncture. Denies redness, rashes, lesions or ulcercations.  Neurological: Denies  dizziness, difficulty with memory, difficulty with speech or problems with balance and coordination.   No other specific complaints in a complete review of systems (except as listed in HPI above).  Objective:   Physical Exam  BP 140/70  Pulse 111  Temp(Src) 98 F (36.7 C) (Tympanic)  SpO2 98% Wt Readings from Last 3 Encounters:  09/29/12 135 lb 8 oz (61.462 kg)  07/07/12 135 lb 12 oz (61.576 kg)  05/13/12 135 lb 12 oz (61.576 kg)    General: Appears her stated age, well developed, well nourished in NAD. Skin: Small puncture to palmar surface of right hand and abrasion to left anterior pointer finger. No rashes, lesions or ulcerations noted. Cardiovascular: Normal rate and rhythm. S1,S2 noted.  No murmur, rubs or gallops noted. No JVD or BLE edema. No carotid bruits noted. Pulmonary/Chest: Normal effort and positive vesicular breath sounds. No respiratory distress. No wheezes, rales or ronchi noted.  Neurological: Alert and oriented. Cranial nerves II-XII intact. Coordination normal. +DTRs bilaterally.   BMET    Component Value Date/Time   NA 138 09/24/2013 0837   K 3.9 09/24/2013 0837   CL 101 09/24/2013 0837   CO2 30 09/24/2013 0837   GLUCOSE 92 09/24/2013 0837   BUN 17 09/24/2013 0837   CREATININE 1.0 09/24/2013 0837   CALCIUM 10.3 09/24/2013 0837   GFRNONAA 58.56 08/07/2010 0929   GFRAA 71 10/05/2008 1056    Lipid Panel     Component Value Date/Time   CHOL 278* 09/24/2013 0837   TRIG 140.0 09/24/2013 0837   HDL 73.90 09/24/2013 0837   CHOLHDL 4 09/24/2013 0837   VLDL 28.0 09/24/2013 0837   LDLCALC 125 04/11/2009    CBC    Component Value Date/Time   WBC 9.1 07/07/2012 1014   RBC 4.12 07/07/2012 1014   HGB 12.8 07/07/2012 1014   HCT 38.5 07/07/2012 1014   PLT 365.0 07/07/2012 1014   MCV 93.5 07/07/2012 1014   MCHC 33.2 07/07/2012 1014   RDW 13.0 07/07/2012 1014   LYMPHSABS 1.9 07/07/2012 1014   MONOABS 0.9 07/07/2012 1014   EOSABS 0.1  07/07/2012 1014   BASOSABS  0.1 07/07/2012 1014    Hgb A1C Lab Results  Component Value Date   HGBA1C 5.6 10/05/2008         Assessment & Plan:   Dog bite, initial encounter:  Tetanus UTD eRx for Augmentin BID x 10 day for prophylaxis Clean wounds with warm soap and water Pt called animal shelter- unsure ifdog has rabies She will hold off on rabies for now, if she decides to get it she will go to ER over weekend.  RTC as needed or if you notice redness or drainage from the wounds

## 2013-09-26 ENCOUNTER — Emergency Department: Payer: Self-pay | Admitting: Emergency Medicine

## 2013-09-26 ENCOUNTER — Other Ambulatory Visit: Payer: Self-pay | Admitting: Family Medicine

## 2013-09-26 DIAGNOSIS — S61409A Unspecified open wound of unspecified hand, initial encounter: Secondary | ICD-10-CM | POA: Diagnosis not present

## 2013-09-29 ENCOUNTER — Emergency Department: Payer: Self-pay | Admitting: Internal Medicine

## 2013-09-29 DIAGNOSIS — Z23 Encounter for immunization: Secondary | ICD-10-CM | POA: Diagnosis not present

## 2013-10-01 ENCOUNTER — Ambulatory Visit (INDEPENDENT_AMBULATORY_CARE_PROVIDER_SITE_OTHER): Payer: Medicare Other | Admitting: Family Medicine

## 2013-10-01 ENCOUNTER — Encounter: Payer: Self-pay | Admitting: Family Medicine

## 2013-10-01 VITALS — BP 120/60 | HR 88 | Temp 97.9°F | Ht 60.5 in | Wt 130.5 lb

## 2013-10-01 DIAGNOSIS — E7800 Pure hypercholesterolemia, unspecified: Secondary | ICD-10-CM

## 2013-10-01 DIAGNOSIS — K449 Diaphragmatic hernia without obstruction or gangrene: Secondary | ICD-10-CM | POA: Diagnosis not present

## 2013-10-01 DIAGNOSIS — F411 Generalized anxiety disorder: Secondary | ICD-10-CM

## 2013-10-01 DIAGNOSIS — Z0000 Encounter for general adult medical examination without abnormal findings: Secondary | ICD-10-CM | POA: Diagnosis not present

## 2013-10-01 DIAGNOSIS — I10 Essential (primary) hypertension: Secondary | ICD-10-CM

## 2013-10-01 DIAGNOSIS — Z1212 Encounter for screening for malignant neoplasm of rectum: Secondary | ICD-10-CM | POA: Diagnosis not present

## 2013-10-01 DIAGNOSIS — IMO0002 Reserved for concepts with insufficient information to code with codable children: Secondary | ICD-10-CM

## 2013-10-01 DIAGNOSIS — S61459A Open bite of unspecified hand, initial encounter: Secondary | ICD-10-CM | POA: Insufficient documentation

## 2013-10-01 DIAGNOSIS — S61452S Open bite of left hand, sequela: Secondary | ICD-10-CM

## 2013-10-01 NOTE — Progress Notes (Signed)
Pre-visit discussion using our clinic review tool. No additional management support is needed unless otherwise documented below in the visit note.  

## 2013-10-01 NOTE — Assessment & Plan Note (Signed)
She refuses medication. She will try further lifestyle changes.

## 2013-10-01 NOTE — Assessment & Plan Note (Signed)
Healing well. On rabies prophylaxsis. Complete antibiotics prophylaxsis.

## 2013-10-01 NOTE — Progress Notes (Signed)
I have personally reviewed the Medicare Annual Wellness questionnaire and have noted  1. The patient's medical and social history  2. Their use of alcohol, tobacco or illicit drugs  3. Their current medications and supplements  4. The patient's functional ability including ADL's, fall risks, home safety risks and hearing or visual  impairment.  5. Diet and physical activities  6. Evidence for depression or mood disorders  The patients weight, height, BMI and visual acuity have been recorded in the chart  I have made referrals, counseling and provided education to the patient based review of the above and I have provided the pt with a written personalized care plan for preventive services.   She was bitten by a dog on left hand and arm last week by dog that is not vaccinated. Dog is currently quarantined. Was a vicious attack.  She is on Augmentin and rabies shots through ER. Since then she has been feeling anxious.  She has gotten back to walking dog despite the issue.  Hypertension: Well controlled on amlodipine and maxide.  She is interested in stopping amlodipine. BP Readings from Last 3 Encounters:  10/01/13 120/60  09/24/13 140/70  09/29/12 120/70  Using medication without problems or lightheadedness:  Chest pain with exertion:None  Edema:None  Short of breath:stable  Average home BPs:120-130/60-64 Other issues:   High cholesterol.. Not at goal < 130!  On fish oil in last year... Has helped with post polio pain as well.  Lab Results  Component Value Date   CHOL 278* 09/24/2013   HDL 73.90 09/24/2013   LDLCALC 125 04/11/2009   LDLDIRECT 168.0 09/24/2013   TRIG 140.0 09/24/2013   CHOLHDL 4 09/24/2013  Red yeast rice causes constipation. Not interested in statin meds.  Diet compliance: Improved  Exercise: walking 2 times a week , limited with polio and arthritis  Other complaints:   Sleep apnea...she feels much better on adjusted CPAP. She is thinking more clearly.  No  further palpitations.  Much better energy.   Hiatal hernia, dysmotiity, chronic .Marland Kitchen She reports choking has gotten worse. Eating multiple small meals a day.  No chest pain she feels diet changes an stopping caffeine have helped this.  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   Review of Systems  Constitutional: Negative for fever, fatigue and unexpected weight change.  HENT: Negative for ear pain, congestion, sore throat, sneezing, trouble swallowing and sinus pressure.  Eyes: Negative for pain and itching.  Respiratory: Negative for cough, shortness of breath and wheezing.  Cardiovascular: Negative for chest pain, palpitations and leg swelling.  Gastrointestinal: Negative from constipation.Negative for abdominal pain, diarrhea and blood in stool.  Genitourinary: Negative for dysuria, hematuria, vaginal discharge, difficulty urinating and menstrual problem.  Skin: Negative for rash.  Neurological: Negative for syncope, weakness, light-headedness, numbness and headaches.  Psychiatric/Behavioral: Negative for confusion and dysphoric mood. The patient is not nervous/anxious.  Objective:   Physical Exam  Constitutional: Vital signs are normal. She appears well-developed and well-nourished. She is cooperative. Non-toxic appearance. She does not appear ill. No distress.  HENT:  Head: Normocephalic.  Right Ear: Hearing, tympanic membrane, external ear and ear canal normal. Tympanic membrane is not erythematous, not retracted and not bulging.  Left Ear: Hearing, tympanic membrane, external ear and ear canal normal. Tympanic membrane is not erythematous, not retracted and not bulging.  Nose: No mucosal edema or rhinorrhea. Right sinus exhibits no maxillary sinus tenderness  and no frontal sinus tenderness. Left sinus exhibits no maxillary sinus tenderness and no frontal sinus tenderness.  Mouth/Throat: Uvula is midline,  oropharynx is clear and moist and mucous membranes are normal.  Eyes: Conjunctivae, EOM and lids are normal. Pupils are equal, round, and reactive to light. No foreign bodies found.  Neck: Trachea normal and normal range of motion. Carotid bruit is not present. Rigidity present. No mass and no thyromegaly present.  Anterior neck right trachea seems more prominent than on last check.  Cardiovascular: Normal rate, regular rhythm, S1 normal, S2 normal, normal heart sounds, intact distal pulses and normal pulses. Exam reveals no gallop and no friction rub.  No murmur heard.  Pulmonary/Chest: Effort normal and breath sounds normal. Not tachypneic. No respiratory distress. She has no decreased breath sounds. She has no wheezes. She has no rhonchi. She has no rales.  Abdominal: Soft. Normal appearance and bowel sounds are normal. There is no tenderness.  Neurological: She is alert.  Skin: Skin is warm, dry and intact. No rash noted.  Healing bites on hands and arms. Psychiatric: She has a normal mood and affect. Her speech is normal and behavior is normal. Judgment and thought content normal. Her mood appears not anxious. Cognition and memory are normal. She does not exhibit a depressed mood.  Assessment & Plan:   Annual Medicare Wellness:  The patient's preventative maintenance and recommended screening tests for an annual wellness exam were reviewed in full today.  Brought up to date unless services declined.  Counselled on the importance of diet, exercise, and its role in overall health and mortality.  The patient's FH and SH was reviewed, including their home life, tobacco status, and drug and alcohol status.   Vaccines:Uptodate PNA, Td, shingles, given flu previously  Mammogram 09/2012 nml, Will plan every 2 years. DXA; 09/2012 osteopenia in femoral neck but total hip and spine are normal. Continue weight bearing exercise as tolerated and Ca and vit D. Will recheck in 2 years. Colon: last done in  2003, repeat due in 2013, she is not interested in repeating ever. Will start ifob.  DVE/pap:pap not indicated.. No family history of uterine or ovarian cancer, asymptomatic. Does not wish to continue DVEs. Nonsmoker

## 2013-10-01 NOTE — Patient Instructions (Addendum)
Stop amlodipine. Follow BP at home.. Call if greater than 140/90 consistently. Okay to stop pantoprazole... Go to prilosec OTC 20 mg daily then every other day then off. Can try almond butter instead of peanut butter.  Can look into welchol for cholesterol control. Stop by lab on your way out for stool test.

## 2013-10-01 NOTE — Assessment & Plan Note (Signed)
Flare of symptoms after dog attack... She will let me know if it is not improving over time.

## 2013-10-01 NOTE — Assessment & Plan Note (Signed)
Well controlled. Will try to stop amlodipine. Follow BP at home.

## 2013-10-01 NOTE — Assessment & Plan Note (Signed)
Wean off pantoprazole to prilosec the off as able.

## 2013-10-21 ENCOUNTER — Other Ambulatory Visit: Payer: Self-pay | Admitting: Family Medicine

## 2013-10-27 ENCOUNTER — Other Ambulatory Visit: Payer: Self-pay | Admitting: Family Medicine

## 2013-11-08 ENCOUNTER — Other Ambulatory Visit: Payer: Self-pay | Admitting: *Deleted

## 2013-11-08 MED ORDER — TRIAMTERENE-HCTZ 75-50 MG PO TABS: ORAL_TABLET | ORAL | Status: DC

## 2013-11-16 ENCOUNTER — Telehealth: Payer: Self-pay | Admitting: *Deleted

## 2013-11-16 NOTE — Telephone Encounter (Signed)
Patient says [facility] is no longer working with Medicare for CPAP supplies so she is switching to [facility].  [facility] needs a prescription for CPAP supplies and a copy of the sleep study.  They will then contact the patient to find out which specific supplies that she needs.  [facility]'s fax number is [phone]

## 2013-11-19 NOTE — Telephone Encounter (Signed)
In outbox.. Please mail with copy of sleep study if we have one.

## 2013-11-19 NOTE — Telephone Encounter (Signed)
CPAP Order and Sleep Study report [facility] [phone].

## 2013-12-08 ENCOUNTER — Telehealth: Payer: Self-pay | Admitting: Family Medicine

## 2013-12-08 DIAGNOSIS — G4733 Obstructive sleep apnea (adult) (pediatric): Secondary | ICD-10-CM

## 2013-12-08 NOTE — Telephone Encounter (Signed)
Pt left voicemail with triage. Pt is trying to get cpap supplies but medicare is saying pt needs another sleep study done, pt needs an order sent to [facility] saying she needs a home sleep study done, pt request call back once order is done

## 2013-12-09 NOTE — Telephone Encounter (Signed)
[place]: How do I order a HOME sleep study in epic?

## 2013-12-13 NOTE — Telephone Encounter (Signed)
Noted  

## 2013-12-13 NOTE — Telephone Encounter (Signed)
You cant order a Home Sleep Study. You need to refer the patient to our Pulmonary Sleep Dr and let them see the patient and take it from there. She needs a New Sleep Study done and we dont order them .

## 2013-12-13 NOTE — Telephone Encounter (Signed)
Spk with Mrs Courtney Chandler, . Per Mrs Courtney Chandler Dr Courtney Chandler has ordered her CPAP equipment previously. Patient needs new tubing and head gear. Patient states "she will die before she goes to another sleep lab".  [facility], for CPAP supplies because [facility].  [facility] requests a more recent sleep study.  Pulmonary Sleep Specialist referral canceled. Order is on Dr. Albertina SenegalBedsoles desk for signature.... Home sleep test ordered.  [facility] Fax# [phone]

## 2013-12-13 NOTE — Telephone Encounter (Signed)
Let pt know for cpap supplies she needs a full sleep study... We do this by referring her to pulmonology sleep specialist for them to set up. She can ask them if there is a home option.

## 2013-12-13 NOTE — Telephone Encounter (Signed)
I will look at order and sign if appropriate

## 2013-12-14 NOTE — Telephone Encounter (Signed)
Home Sleep Study order [facility] to [facility] [phone].

## 2014-01-13 DIAGNOSIS — H4010X0 Unspecified open-angle glaucoma, stage unspecified: Secondary | ICD-10-CM | POA: Diagnosis not present

## 2014-01-21 NOTE — Telephone Encounter (Signed)
Noted. Will Cc Lupita LeashDonna so she is aware as well.

## 2014-01-21 NOTE — Telephone Encounter (Signed)
Pt left v/m; pt having problems getting c pap supplies and pt is trying different c pap supply cos that are approved by medicare; pt wants Dr Ermalene SearingBedsole to be aware another c pap co will be contacting office for supplies.

## 2014-01-30 ENCOUNTER — Other Ambulatory Visit: Payer: Self-pay | Admitting: Family Medicine

## 2014-02-03 ENCOUNTER — Other Ambulatory Visit (INDEPENDENT_AMBULATORY_CARE_PROVIDER_SITE_OTHER): Payer: Medicare Other

## 2014-02-03 ENCOUNTER — Encounter: Payer: Self-pay | Admitting: *Deleted

## 2014-02-03 DIAGNOSIS — Z1212 Encounter for screening for malignant neoplasm of rectum: Secondary | ICD-10-CM | POA: Diagnosis not present

## 2014-02-03 LAB — FECAL OCCULT BLOOD, IMMUNOCHEMICAL: FECAL OCCULT BLD: NEGATIVE

## 2014-03-01 NOTE — Telephone Encounter (Signed)
Pt left v/m; pt did not have success [facility]; pt is now working with [facility] equipment; pt wanted Dr Ermalene SearingBedsole to know [facility] will be contacting her. [facility] has copy of pts complete sleep study.

## 2014-03-01 NOTE — Telephone Encounter (Signed)
Noted  

## 2014-04-25 ENCOUNTER — Other Ambulatory Visit: Payer: Self-pay | Admitting: Family Medicine

## 2014-07-19 DIAGNOSIS — H4010X0 Unspecified open-angle glaucoma, stage unspecified: Secondary | ICD-10-CM | POA: Diagnosis not present

## 2014-07-21 ENCOUNTER — Other Ambulatory Visit: Payer: Self-pay | Admitting: Family Medicine

## 2014-07-29 ENCOUNTER — Other Ambulatory Visit: Payer: Self-pay | Admitting: Family Medicine

## 2014-09-26 ENCOUNTER — Telehealth: Payer: Self-pay | Admitting: Family Medicine

## 2014-09-26 DIAGNOSIS — E7800 Pure hypercholesterolemia, unspecified: Secondary | ICD-10-CM

## 2014-09-26 DIAGNOSIS — E876 Hypokalemia: Secondary | ICD-10-CM

## 2014-09-26 DIAGNOSIS — E739 Lactose intolerance, unspecified: Secondary | ICD-10-CM

## 2014-09-26 NOTE — Telephone Encounter (Signed)
-----   Message from Terri J Walsh sent at 09/22/2014  3:10 PM EST ----- Regarding: Lab orders for Tuesday, 11.17.15 Patient is scheduled for CPX labs, please order future labs, Thanks , Terri  

## 2014-09-27 ENCOUNTER — Other Ambulatory Visit (INDEPENDENT_AMBULATORY_CARE_PROVIDER_SITE_OTHER): Payer: Medicare Other

## 2014-09-27 DIAGNOSIS — E7800 Pure hypercholesterolemia, unspecified: Secondary | ICD-10-CM

## 2014-09-28 LAB — COMPREHENSIVE METABOLIC PANEL
ALK PHOS: 42 U/L (ref 39–117)
ALT: 10 U/L (ref 0–35)
AST: 14 U/L (ref 0–37)
Albumin: 3.5 g/dL (ref 3.5–5.2)
BILIRUBIN TOTAL: 0.4 mg/dL (ref 0.2–1.2)
BUN: 13 mg/dL (ref 6–23)
CO2: 23 mEq/L (ref 19–32)
Calcium: 9.2 mg/dL (ref 8.4–10.5)
Chloride: 109 mEq/L (ref 96–112)
Creatinine, Ser: 0.9 mg/dL (ref 0.4–1.2)
GFR: 62.92 mL/min (ref >60.00)
GLUCOSE: 75 mg/dL (ref 70–99)
Potassium: 4 mEq/L (ref 3.5–5.1)
SODIUM: 140 meq/L (ref 135–145)
TOTAL PROTEIN: 6.6 g/dL (ref 6.0–8.3)

## 2014-09-28 LAB — LIPID PANEL
CHOLESTEROL: 212 mg/dL — AB (ref 0–200)
HDL: 66.4 mg/dL (ref >39.00)
LDL CALC: 119 mg/dL — AB (ref 0–99)
NonHDL: 145.6
TRIGLYCERIDES: 134 mg/dL (ref 0.0–149.0)
Total CHOL/HDL Ratio: 3
VLDL: 26.8 mg/dL (ref 0.0–40.0)

## 2014-10-03 ENCOUNTER — Encounter: Payer: Self-pay | Admitting: Family Medicine

## 2014-10-03 ENCOUNTER — Ambulatory Visit (INDEPENDENT_AMBULATORY_CARE_PROVIDER_SITE_OTHER): Payer: Medicare Other | Admitting: Family Medicine

## 2014-10-03 VITALS — BP 128/62 | HR 87 | Temp 98.4°F | Ht 60.63 in | Wt 132.2 lb

## 2014-10-03 DIAGNOSIS — Z7189 Other specified counseling: Secondary | ICD-10-CM

## 2014-10-03 DIAGNOSIS — Z1211 Encounter for screening for malignant neoplasm of colon: Secondary | ICD-10-CM

## 2014-10-03 DIAGNOSIS — Z0000 Encounter for general adult medical examination without abnormal findings: Secondary | ICD-10-CM | POA: Diagnosis not present

## 2014-10-03 DIAGNOSIS — E7800 Pure hypercholesterolemia, unspecified: Secondary | ICD-10-CM

## 2014-10-03 DIAGNOSIS — Z66 Do not resuscitate: Secondary | ICD-10-CM

## 2014-10-03 DIAGNOSIS — Z23 Encounter for immunization: Secondary | ICD-10-CM

## 2014-10-03 DIAGNOSIS — J4540 Moderate persistent asthma, uncomplicated: Secondary | ICD-10-CM

## 2014-10-03 DIAGNOSIS — I10 Essential (primary) hypertension: Secondary | ICD-10-CM

## 2014-10-03 NOTE — Assessment & Plan Note (Signed)
Improved control at goal on no med.

## 2014-10-03 NOTE — Assessment & Plan Note (Signed)
Slight worsening  Per pt but no current wheeze. She will call if not improving with improvement of allergies. Continue controllers.

## 2014-10-03 NOTE — Assessment & Plan Note (Signed)
Well controlled. Continue current medication.  

## 2014-10-03 NOTE — Progress Notes (Signed)
I have personally reviewed the Medicare Annual Wellness questionnaire and have noted  1. The patient's medical and social history  2. Their use of alcohol, tobacco or illicit drugs  3. Their current medications and supplements  4. The patient's functional ability including ADL's, fall risks, home safety risks and hearing or visual  impairment.  5. Diet and physical activities  6. Evidence for depression or mood disorders  The patients weight, height, BMI and visual acuity have been recorded in the chart  I have made referrals, counseling and provided education to the patient based review of the above and I have provided the pt with a written personalized care plan for preventive services.   Hypertension: Well controlled on amlodipine and maxide.  BP Readings from Last 3 Encounters:  10/03/14 128/62  10/01/13 120/60  09/24/13 140/70  Using medication without problems or lightheadedness:  none Chest pain with exertion:None  Edema:None  Short of breath: She has noted some increase in SOB with allergies. Average home BPs: 130/80 Other issues:   Asthma, moderate worsening lately. On advair, albuterol and singulair. No fever, no change in cough.  High cholesterol..LDL at goal < 130. On fish oil... Has helped with post polio pain as well. Lab Results  Component Value Date   CHOL 212* 09/27/2014   HDL 66.40 09/27/2014   LDLCALC 119* 09/27/2014   LDLDIRECT 168.0 09/24/2013   TRIG 134.0 09/27/2014   CHOLHDL 3 09/27/2014  Red yeast rice causes constipation. Not interested in statin meds.  Diet compliance: Improved, working on this. Exercise: walking 2 times a week , limited with polio and arthritis  Other complaints:   Sleep apnea...on CPAP.  No further palpitations.  Much better energy.   Hiatal hernia, dysmotiity, chronic .Marland Kitchen. She reports choking and swallowing is much better. 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 She is feeling better with thinking and energy as well.   reports that she has quit smoking. She has never used smokeless tobacco. She reports that she drinks alcohol. She reports that she does not use illicit drugs.   Review of Systems  Constitutional: Negative for fever, fatigue and unexpected weight change.  HENT: Negative for ear pain, congestion, sore throat, sneezing, trouble swallowing and sinus pressure.  Eyes: Negative for pain and itching.  Respiratory: Negative for cough, shortness of breath and wheezing.  Cardiovascular: Negative for chest pain, palpitations and leg swelling.  Gastrointestinal: Negative from constipation.Negative for abdominal pain, diarrhea and blood in stool.  Genitourinary: Negative for dysuria, hematuria, vaginal discharge, difficulty urinating and menstrual problem.  Skin: Negative for rash.  Neurological: Negative for syncope, weakness, light-headedness, numbness and headaches.   MSK : she does have arthritis changes and pain in hands bilaterally, some pain in left hip. She is successfully treating with tylenol or ibuprofen. Psychiatric/Behavioral: Negative for confusion and dysphoric mood. The patient is not nervous/anxious.    Objective:   Physical Exam  Constitutional: Vital signs are normal. She appears well-developed and well-nourished. She is cooperative. Non-toxic appearance. She does not appear ill. No distress.  HENT:  Head: Normocephalic.  Right Ear: Hearing, tympanic membrane, external ear and ear canal normal. Tympanic membrane is not erythematous, not retracted and not bulging.  Left Ear: Hearing, tympanic membrane, external ear and ear canal normal. Tympanic membrane is not erythematous, not retracted and not bulging.  Nose: No mucosal edema or rhinorrhea. Right sinus exhibits no maxillary sinus tenderness and no frontal  sinus tenderness. Left sinus exhibits no maxillary sinus tenderness and no  frontal sinus tenderness.  Mouth/Throat: Uvula is midline, oropharynx is clear and moist and mucous membranes are normal.  Eyes: Conjunctivae, EOM and lids are normal. Pupils are equal, round, and reactive to light. No foreign bodies found.  Neck: Trachea normal and normal range of motion. Carotid bruit is not present. Rigidity present. No mass and no thyromegaly present.  Cardiovascular: Normal rate, regular rhythm, S1 normal, S2 normal, normal heart sounds, intact distal pulses and normal pulses. Exam reveals no gallop and no friction rub.  No murmur heard.  Pulmonary/Chest: Effort normal and breath sounds normal. Not tachypneic. No respiratory distress. She has no decreased breath sounds. She has no wheezes. She has no rhonchi. She has no rales.  Abdominal: Soft. Normal appearance and bowel sounds are normal. There is no tenderness.  Neurological: She is alert.  Skin: Skin is warm, dry and intact. No rash noted.  Psychiatric: She has a normal mood and affect. Her speech is normal and behavior is normal. Judgment and thought content normal. Her mood appears not anxious. Cognition and memory are normal. She does not exhibit a depressed mood.    Assessment & Plan:   Annual Medicare Wellness:  The patient's preventative maintenance and recommended screening tests for an annual wellness exam were reviewed in full today.  Brought up to date unless services declined.  Counselled on the importance of diet, exercise, and its role in overall health and mortality.  The patient's FH and SH was reviewed, including their home life, tobacco status, and drug and alcohol status.   Vaccines:Uptodate PNA, Td, shingles, given flu  And prevnar todayMammogram Due DXA; 2013 osteopenia. Vit D  Recheck in 5  years. Colon: last done in 2003, repeat due in 2013, she is not interested in repeating ever. Will start ifob. DVE/pap:pap not indicated.. No family history of uterine or ovarian cancer,  asymptomatic. Does not wish to continue DVEs. Former smoker, < 25 pack year history.

## 2014-10-03 NOTE — Progress Notes (Signed)
Pre visit review using our clinic review tool, if applicable. No additional management support is needed unless otherwise documented below in the visit note. 

## 2014-10-03 NOTE — Patient Instructions (Signed)
Stop at lab on way out to get stool test. Schedule mammogram on own.

## 2014-10-04 ENCOUNTER — Encounter: Payer: Medicare Other | Admitting: Family Medicine

## 2014-10-04 ENCOUNTER — Telehealth: Payer: Self-pay | Admitting: Family Medicine

## 2014-10-04 NOTE — Telephone Encounter (Signed)
emmi mailed  °

## 2014-10-21 ENCOUNTER — Other Ambulatory Visit: Payer: Self-pay | Admitting: Family Medicine

## 2014-10-27 ENCOUNTER — Other Ambulatory Visit: Payer: Self-pay | Admitting: Family Medicine

## 2014-11-23 ENCOUNTER — Other Ambulatory Visit: Payer: Self-pay | Admitting: Family Medicine

## 2014-12-13 ENCOUNTER — Other Ambulatory Visit (INDEPENDENT_AMBULATORY_CARE_PROVIDER_SITE_OTHER): Payer: Medicare Other

## 2014-12-13 ENCOUNTER — Telehealth: Payer: Self-pay | Admitting: Radiology

## 2014-12-13 DIAGNOSIS — Z1211 Encounter for screening for malignant neoplasm of colon: Secondary | ICD-10-CM | POA: Diagnosis not present

## 2014-12-13 LAB — FECAL OCCULT BLOOD, IMMUNOCHEMICAL: FECAL OCCULT BLD: POSITIVE — AB

## 2014-12-13 NOTE — Telephone Encounter (Signed)
[facility] called a critical ifob - POSITIVE, results given to Dr Ermalene SearingBedsole

## 2014-12-16 ENCOUNTER — Telehealth: Payer: Self-pay | Admitting: Family Medicine

## 2014-12-16 DIAGNOSIS — Z1211 Encounter for screening for malignant neoplasm of colon: Secondary | ICD-10-CM

## 2014-12-16 MED ORDER — HYDROCORTISONE 2.5 % RE CREA: 1.0000 "application " | TOPICAL_CREAM | Freq: Two times a day (BID) | RECTAL | Status: DC

## 2014-12-16 NOTE — Telephone Encounter (Signed)
-----   Message from [name], CMA sent at 12/16/2014 12:35 PM EST ----- Spoke with [name].  She would like to try the topical steroid cream and repeat IFOB in 1-2 months.  Please send Rx to [facility] [address]  She states that if the second test comes by positive then she is agreeable to GI Referral.

## 2014-12-16 NOTE — Telephone Encounter (Signed)
Sent in rx andordered second ifob.

## 2015-01-09 DIAGNOSIS — Z1231 Encounter for screening mammogram for malignant neoplasm of breast: Secondary | ICD-10-CM | POA: Diagnosis not present

## 2015-01-11 ENCOUNTER — Encounter: Payer: Self-pay | Admitting: Family Medicine

## 2015-01-12 ENCOUNTER — Other Ambulatory Visit: Payer: Self-pay | Admitting: Family Medicine

## 2015-01-16 DIAGNOSIS — H4011X1 Primary open-angle glaucoma, mild stage: Secondary | ICD-10-CM | POA: Diagnosis not present

## 2015-01-18 ENCOUNTER — Other Ambulatory Visit (INDEPENDENT_AMBULATORY_CARE_PROVIDER_SITE_OTHER): Payer: Medicare Other

## 2015-01-18 DIAGNOSIS — Z1211 Encounter for screening for malignant neoplasm of colon: Secondary | ICD-10-CM

## 2015-01-19 ENCOUNTER — Telehealth: Payer: Self-pay | Admitting: Family Medicine

## 2015-01-19 DIAGNOSIS — R195 Other fecal abnormalities: Secondary | ICD-10-CM

## 2015-01-19 LAB — FECAL OCCULT BLOOD, IMMUNOCHEMICAL: FECAL OCCULT BLD: POSITIVE — AB

## 2015-01-19 NOTE — Telephone Encounter (Signed)
-----   Message from [name], CMA sent at 01/19/2015  2:43 PM EST ----- Ms. Laruth BouchardGotzen notified as instructed by telephone.  She is agreeable with the referral for colonoscopy.  She request to be referred somewhere in [place].

## 2015-01-23 DIAGNOSIS — H4011X1 Primary open-angle glaucoma, mild stage: Secondary | ICD-10-CM | POA: Diagnosis not present

## 2015-01-30 ENCOUNTER — Encounter: Payer: Self-pay | Admitting: Family Medicine

## 2015-01-31 NOTE — Progress Notes (Signed)
Order(s) created erroneously. Erroneous order ID: [id number]  Order moved by: Melene PlanKECK, Indiya Izquierdo J  Order move date/time: 01/31/2015 10:54 AM  Source Patient: [id number]  Source Contact: 02/05/2011  Destination Patient: [id number]  Destination Contact: 01/26/2013

## 2015-02-27 DIAGNOSIS — R195 Other fecal abnormalities: Secondary | ICD-10-CM | POA: Diagnosis not present

## 2015-02-27 DIAGNOSIS — R1314 Dysphagia, pharyngoesophageal phase: Secondary | ICD-10-CM | POA: Diagnosis not present

## 2015-03-06 ENCOUNTER — Ambulatory Visit
Admit: 2015-03-06 | Disposition: A | Discharge status: Home / Self Care | Payer: Self-pay | Attending: Gastroenterology | Admitting: Gastroenterology

## 2015-03-06 DIAGNOSIS — K449 Diaphragmatic hernia without obstruction or gangrene: Secondary | ICD-10-CM | POA: Diagnosis not present

## 2015-03-06 DIAGNOSIS — K644 Residual hemorrhoidal skin tags: Secondary | ICD-10-CM | POA: Diagnosis not present

## 2015-03-06 DIAGNOSIS — Z79899 Other long term (current) drug therapy: Secondary | ICD-10-CM | POA: Diagnosis not present

## 2015-03-06 DIAGNOSIS — K210 Gastro-esophageal reflux disease with esophagitis: Secondary | ICD-10-CM | POA: Diagnosis not present

## 2015-03-06 DIAGNOSIS — K2960 Other gastritis without bleeding: Secondary | ICD-10-CM | POA: Diagnosis not present

## 2015-03-06 DIAGNOSIS — G4733 Obstructive sleep apnea (adult) (pediatric): Secondary | ICD-10-CM | POA: Diagnosis not present

## 2015-03-06 DIAGNOSIS — K5730 Diverticulosis of large intestine without perforation or abscess without bleeding: Secondary | ICD-10-CM | POA: Diagnosis not present

## 2015-03-06 DIAGNOSIS — K921 Melena: Secondary | ICD-10-CM | POA: Diagnosis not present

## 2015-03-06 DIAGNOSIS — R112 Nausea with vomiting, unspecified: Secondary | ICD-10-CM | POA: Diagnosis not present

## 2015-03-06 DIAGNOSIS — K2950 Unspecified chronic gastritis without bleeding: Secondary | ICD-10-CM | POA: Diagnosis not present

## 2015-03-06 DIAGNOSIS — J45909 Unspecified asthma, uncomplicated: Secondary | ICD-10-CM | POA: Diagnosis not present

## 2015-03-06 DIAGNOSIS — Z885 Allergy status to narcotic agent status: Secondary | ICD-10-CM | POA: Diagnosis not present

## 2015-03-06 DIAGNOSIS — I10 Essential (primary) hypertension: Secondary | ICD-10-CM | POA: Diagnosis not present

## 2015-03-06 DIAGNOSIS — Z791 Long term (current) use of non-steroidal anti-inflammatories (NSAID): Secondary | ICD-10-CM | POA: Diagnosis not present

## 2015-03-06 DIAGNOSIS — R1310 Dysphagia, unspecified: Secondary | ICD-10-CM | POA: Diagnosis not present

## 2015-03-06 DIAGNOSIS — K640 First degree hemorrhoids: Secondary | ICD-10-CM | POA: Diagnosis not present

## 2015-03-06 DIAGNOSIS — R195 Other fecal abnormalities: Secondary | ICD-10-CM | POA: Diagnosis not present

## 2015-03-07 LAB — SURGICAL PATHOLOGY

## 2015-03-17 ENCOUNTER — Encounter: Payer: Self-pay | Admitting: Family Medicine

## 2015-04-27 ENCOUNTER — Other Ambulatory Visit: Payer: Self-pay | Admitting: Family Medicine

## 2015-07-24 DIAGNOSIS — H4011X1 Primary open-angle glaucoma, mild stage: Secondary | ICD-10-CM | POA: Diagnosis not present

## 2015-08-24 ENCOUNTER — Other Ambulatory Visit: Payer: Self-pay | Admitting: Family Medicine

## 2015-10-02 ENCOUNTER — Other Ambulatory Visit (INDEPENDENT_AMBULATORY_CARE_PROVIDER_SITE_OTHER): Payer: Medicare Other

## 2015-10-02 DIAGNOSIS — Z0000 Encounter for general adult medical examination without abnormal findings: Secondary | ICD-10-CM

## 2015-10-02 DIAGNOSIS — E7800 Pure hypercholesterolemia, unspecified: Secondary | ICD-10-CM | POA: Diagnosis not present

## 2015-10-02 LAB — COMPREHENSIVE METABOLIC PANEL
ALK PHOS: 51 U/L (ref 39–117)
ALT: 11 U/L (ref 0–35)
AST: 15 U/L (ref 0–37)
Albumin: 4 g/dL (ref 3.5–5.2)
BUN: 12 mg/dL (ref 6–23)
CHLORIDE: 103 meq/L (ref 96–112)
CO2: 27 mEq/L (ref 19–32)
Calcium: 9.6 mg/dL (ref 8.4–10.5)
Creatinine, Ser: 0.83 mg/dL (ref 0.40–1.20)
GFR: 71.55 mL/min (ref >60.00)
GLUCOSE: 78 mg/dL (ref 70–99)
POTASSIUM: 4.1 meq/L (ref 3.5–5.1)
Sodium: 137 mEq/L (ref 135–145)
TOTAL PROTEIN: 7.1 g/dL (ref 6.0–8.3)
Total Bilirubin: 0.4 mg/dL (ref 0.2–1.2)

## 2015-10-02 LAB — LIPID PANEL
Cholesterol: 201 mg/dL — ABNORMAL HIGH (ref 0–200)
HDL: 75.8 mg/dL (ref >39.00)
LDL CALC: 97 mg/dL (ref 0–99)
NONHDL: 124.87
Total CHOL/HDL Ratio: 3
Triglycerides: 138 mg/dL (ref 0.0–149.0)
VLDL: 27.6 mg/dL (ref 0.0–40.0)

## 2015-10-09 ENCOUNTER — Encounter: Payer: Self-pay | Admitting: Family Medicine

## 2015-10-09 ENCOUNTER — Ambulatory Visit (INDEPENDENT_AMBULATORY_CARE_PROVIDER_SITE_OTHER): Payer: Medicare Other | Admitting: Family Medicine

## 2015-10-09 VITALS — BP 116/60 | HR 99 | Temp 98.4°F | Ht 60.5 in | Wt 129.0 lb

## 2015-10-09 DIAGNOSIS — Z23 Encounter for immunization: Secondary | ICD-10-CM | POA: Diagnosis not present

## 2015-10-09 DIAGNOSIS — Z0000 Encounter for general adult medical examination without abnormal findings: Secondary | ICD-10-CM

## 2015-10-09 DIAGNOSIS — I10 Essential (primary) hypertension: Secondary | ICD-10-CM | POA: Diagnosis not present

## 2015-10-09 DIAGNOSIS — B91 Sequelae of poliomyelitis: Secondary | ICD-10-CM

## 2015-10-09 DIAGNOSIS — J4540 Moderate persistent asthma, uncomplicated: Secondary | ICD-10-CM

## 2015-10-09 DIAGNOSIS — Z7189 Other specified counseling: Secondary | ICD-10-CM

## 2015-10-09 DIAGNOSIS — K209 Esophagitis, unspecified without bleeding: Secondary | ICD-10-CM

## 2015-10-09 DIAGNOSIS — E7800 Pure hypercholesterolemia, unspecified: Secondary | ICD-10-CM

## 2015-10-09 NOTE — Assessment & Plan Note (Signed)
Improving as she ages. Pain tolerable.

## 2015-10-09 NOTE — Progress Notes (Signed)
I have personally reviewed the Medicare Annual Wellness questionnaire and have noted  1. The patient's medical and social history  2. Their use of alcohol, tobacco or illicit drugs  3. Their current medications and supplements  4. The patient's functional ability including ADL's, fall risks, home safety risks and hearing or visual  impairment.  5. Diet and physical activities  6. Evidence for depression or mood disorders  The patients weight, height, BMI and visual acuity have been recorded in the chart  I have made referrals, counseling and provided education to the patient based review of the above and I have provided the pt with a written personalized care plan for preventive services.   Asthma, moderate worsening lately in last few weeks. On advair 500/50, albuterol (using up to three times a day, sometimes less)   and singulair. No fever, no change in cough.  She has been feeling more short of breath in last  Few weeks. Worse on days with increased smoke in air. Does have allergies this time of year with leaves. No congestion. No post nasal drip. Hoarse voice off and on. Occ dizziness. No vertigo. Some loss of equilibrium.   Per GI in last year: ENDO showed esophagitis. Now on nexium.  Stopped motrin as was likely causing symptoms.  She is now only occ using aspirin or tylenol for body pain.  Hypertension: Well controlled on amlodipine and maxide.  BP Readings from Last 3 Encounters:  10/09/15 116/60  10/03/14 128/62  10/01/13 120/60  Using medication without problems or lightheadedness:  none Chest pain with exertion:None  Edema:None  Short of breath: She has noted some increase in SOB with allergies. Average home BPs: 130/80 Other issues:   High cholesterol..LDL at goal < 130. On fish oil... Has helped with post polio pain as well. . Lab Results  Component Value Date   CHOL 201* 10/02/2015   HDL 75.80 10/02/2015   LDLCALC 97 10/02/2015   LDLDIRECT 168.0  09/24/2013   TRIG 138.0 10/02/2015   CHOLHDL 3 10/02/2015  Red yeast rice causes constipation. Not interested in statin meds.  Diet compliance: Improved, working on this. Exercise: walking 2 times a week , limited with polio and arthritis  Other complaints:   Sleep apnea...on CPAP.  No further palpitations.  Much better energy.   Hiatal hernia, dysmotiity, chronic .Marland Kitchen She reports choking and swallowing is much better. She feels it is better with improvement in polio. 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 She is feeling better with thinking and energy as well. She has had strengthening in muscles and no longer needs her leg brace as much.   Reports that she has quit smoking. She has never used smokeless tobacco. She reports that she drinks alcohol. She reports that she does not use illicit drugs.   Review of Systems  Constitutional: Negative for fever, fatigue and unexpected weight change.  HENT: Negative for ear pain, congestion, sore throat, sneezing, trouble swallowing and sinus pressure.  Eyes: Negative for pain and itching.  Respiratory: Negative for cough, shortness of breath and wheezing.  Cardiovascular: Negative for chest pain, palpitations and leg swelling.  Gastrointestinal: Negative from constipation.Negative for abdominal pain, diarrhea and blood in stool.  Genitourinary: Negative for dysuria, hematuria, vaginal discharge, difficulty urinating and menstrual problem.  Skin: Negative for rash.  Neurological: Negative for syncope, weakness, light-headedness, numbness and headaches.  MSK : She does have arthritis changes and pain  in hands bilaterally, some pain in left hip. She is successfully treating with tylenol or ibuprofen. Psychiatric/Behavioral: Negative for confusion and dysphoric mood. The patient is not nervous/anxious.    Objective:   Physical Exam  Constitutional:  Vital signs are normal. She appears well-developed and well-nourished. She is cooperative. Non-toxic appearance. She does not appear ill. No distress.  HENT:  Head: Normocephalic.  Right Ear: Hearing, tympanic membrane, external ear and ear canal normal. Tympanic membrane is not erythematous, not retracted and not bulging.  Left Ear: Hearing, tympanic membrane, external ear and ear canal normal. Tympanic membrane is not erythematous, not retracted and not bulging.  Nose: No mucosal edema or rhinorrhea. Right sinus exhibits no maxillary sinus tenderness and no frontal sinus tenderness. Left sinus exhibits no maxillary sinus tenderness and no frontal sinus tenderness.  Mouth/Throat: Uvula is midline, oropharynx is clear and moist and mucous membranes are normal.  Eyes: Conjunctivae, EOM and lids are normal. Pupils are equal, round, and reactive to light. No foreign bodies found.  Neck: Trachea normal and normal range of motion. Carotid bruit is not present. Rigidity present. No mass and no thyromegaly present.  Cardiovascular: Normal rate, regular rhythm, S1 normal, S2 normal, normal heart sounds, intact distal pulses and normal pulses. Exam reveals no gallop and no friction rub.  No murmur heard.  Pulmonary/Chest: Effort normal and breath sounds normal. Not tachypneic. No respiratory distress. She has no decreased breath sounds. She has no wheezes. She has no rhonchi. She has no rales.  Abdominal: Soft. Normal appearance and bowel sounds are normal. There is no tenderness.  Neurological: She is alert.  Skin: Skin is warm, dry and intact. No rash noted.  Psychiatric: She has a normal mood and affect. Her speech is normal and behavior is normal. Judgment and thought content normal. Her mood appears not anxious. Cognition and memory are normal. She does not exhibit a depressed mood.   Refused breat exam today.. Will repeat next year.  Assessment & Plan:   Annual Medicare Wellness:   The patient's preventative maintenance and recommended screening tests for an annual wellness exam were reviewed in full today.  Brought up to date unless services declined.  Counselled on the importance of diet, exercise, and its role in overall health and mortality.  The patient's FH and SH was reviewed, including their home life, tobacco status, and drug and alcohol status.   Vaccines:Uptodate PNA, prevnar Td, shingles, given flu today  Mammogram 12/2014 normal, repeat in 2 years DXA; 2013 osteopenia. Vit D Recheck in 5 years. Colon:  Colonoscopy: 02/2015 hemorrhoids are cause of positive IFOB. DVE/pap:pap not indicated.. No family history of uterine or ovarian cancer, asymptomatic. Does not wish to continue DVEs. Former smoker, < 25 pack year history.

## 2015-10-09 NOTE — Patient Instructions (Addendum)
Call if asthma not continuing to improve.  keep working on healthy eating and regular exercise as tolerated.

## 2015-10-09 NOTE — Assessment & Plan Note (Signed)
Worsened control with allergies to recent smoke. She refuses pred taper given past SE. ON max advair as well as Singulair.  IF her high level of albuterol use continues.. She will follow up for med adjustment.

## 2015-10-09 NOTE — Progress Notes (Signed)
Pre visit review using our clinic review tool, if applicable. No additional management support is needed unless otherwise documented below in the visit note. 

## 2015-10-09 NOTE — Assessment & Plan Note (Addendum)
Has DNR and MOST form  Completed and reviewed. Documented no change. HCPOA is son Shayne Alken(Todd Schaafsma) Has living will.

## 2015-10-09 NOTE — Assessment & Plan Note (Signed)
Excellent control on current regimen. Encouraged exercise, weight loss, healthy eating habits.

## 2015-10-09 NOTE — Assessment & Plan Note (Signed)
IMproved on nexium and with stopping NSAIDS.

## 2015-10-09 NOTE — Assessment & Plan Note (Signed)
Well controlled. Continue current medication.  

## 2015-10-23 ENCOUNTER — Other Ambulatory Visit: Payer: Self-pay | Admitting: Family Medicine

## 2015-11-09 ENCOUNTER — Encounter: Payer: Self-pay | Admitting: *Deleted

## 2015-11-24 ENCOUNTER — Telehealth: Payer: Self-pay

## 2015-11-24 NOTE — Telephone Encounter (Signed)
Pt left v/m; pt has to change company for c pap supplies due to medicare guidelines; pt request order for cpap mask with head gear and tubing;current office notes in last 6 months that explain usage and benefit of therapy faxed to [facility] fax # [phone]. Annual exam 10/09/15. Pt request cb when order and notes faxed.

## 2015-11-24 NOTE — Telephone Encounter (Signed)
Rx in outbox, send with last CPX as this documents usage and benefit.

## 2015-11-25 ENCOUNTER — Other Ambulatory Visit: Payer: Self-pay | Admitting: Family Medicine

## 2015-11-27 NOTE — Telephone Encounter (Signed)
Sent. Faxed Rx and last CPX documents to [facility] at [phone]

## 2016-01-24 DIAGNOSIS — H401131 Primary open-angle glaucoma, bilateral, mild stage: Secondary | ICD-10-CM | POA: Diagnosis not present

## 2016-01-29 DIAGNOSIS — H401131 Primary open-angle glaucoma, bilateral, mild stage: Secondary | ICD-10-CM | POA: Diagnosis not present

## 2016-01-31 DIAGNOSIS — H2513 Age-related nuclear cataract, bilateral: Secondary | ICD-10-CM | POA: Diagnosis not present

## 2016-02-05 ENCOUNTER — Encounter: Payer: Self-pay | Admitting: *Deleted

## 2016-02-06 NOTE — Discharge Instructions (Signed)

## 2016-02-07 ENCOUNTER — Ambulatory Visit
Admission: RE | Admit: 2016-02-07 | Discharge: 2016-02-07 | Disposition: A | Discharge status: Home / Self Care | Payer: Medicare Other | Source: Ambulatory Visit | Attending: Ophthalmology | Admitting: Ophthalmology

## 2016-02-07 ENCOUNTER — Encounter
Admission: RE | Disposition: A | Discharge status: Home / Self Care | Payer: Self-pay | Source: Ambulatory Visit | Attending: Ophthalmology

## 2016-02-07 ENCOUNTER — Ambulatory Visit: Payer: Medicare Other | Admitting: Anesthesiology

## 2016-02-07 DIAGNOSIS — E7800 Pure hypercholesterolemia, unspecified: Secondary | ICD-10-CM | POA: Diagnosis not present

## 2016-02-07 DIAGNOSIS — H269 Unspecified cataract: Secondary | ICD-10-CM | POA: Diagnosis present

## 2016-02-07 DIAGNOSIS — K5790 Diverticulosis of intestine, part unspecified, without perforation or abscess without bleeding: Secondary | ICD-10-CM | POA: Insufficient documentation

## 2016-02-07 DIAGNOSIS — H2512 Age-related nuclear cataract, left eye: Secondary | ICD-10-CM | POA: Diagnosis not present

## 2016-02-07 DIAGNOSIS — Z881 Allergy status to other antibiotic agents status: Secondary | ICD-10-CM | POA: Insufficient documentation

## 2016-02-07 DIAGNOSIS — M8580 Other specified disorders of bone density and structure, unspecified site: Secondary | ICD-10-CM | POA: Diagnosis not present

## 2016-02-07 DIAGNOSIS — Z7951 Long term (current) use of inhaled steroids: Secondary | ICD-10-CM | POA: Insufficient documentation

## 2016-02-07 DIAGNOSIS — G4730 Sleep apnea, unspecified: Secondary | ICD-10-CM | POA: Insufficient documentation

## 2016-02-07 DIAGNOSIS — Z7982 Long term (current) use of aspirin: Secondary | ICD-10-CM | POA: Insufficient documentation

## 2016-02-07 DIAGNOSIS — H2513 Age-related nuclear cataract, bilateral: Secondary | ICD-10-CM | POA: Diagnosis not present

## 2016-02-07 DIAGNOSIS — J45909 Unspecified asthma, uncomplicated: Secondary | ICD-10-CM | POA: Diagnosis not present

## 2016-02-07 DIAGNOSIS — Z79899 Other long term (current) drug therapy: Secondary | ICD-10-CM | POA: Diagnosis not present

## 2016-02-07 DIAGNOSIS — M1990 Unspecified osteoarthritis, unspecified site: Secondary | ICD-10-CM | POA: Insufficient documentation

## 2016-02-07 DIAGNOSIS — Z87891 Personal history of nicotine dependence: Secondary | ICD-10-CM | POA: Diagnosis not present

## 2016-02-07 DIAGNOSIS — Z888 Allergy status to other drugs, medicaments and biological substances status: Secondary | ICD-10-CM | POA: Insufficient documentation

## 2016-02-07 DIAGNOSIS — Z8612 Personal history of poliomyelitis: Secondary | ICD-10-CM | POA: Diagnosis not present

## 2016-02-07 DIAGNOSIS — Z885 Allergy status to narcotic agent status: Secondary | ICD-10-CM | POA: Diagnosis not present

## 2016-02-07 HISTORY — DX: Other complications of anesthesia, initial encounter: T88.59XA

## 2016-02-07 HISTORY — PX: CATARACT EXTRACTION W/PHACO: SHX586

## 2016-02-07 HISTORY — DX: Sleep apnea, unspecified: G47.30

## 2016-02-07 HISTORY — DX: Unequal limb length (acquired), unspecified site: M21.70

## 2016-02-07 HISTORY — DX: Presence of dental prosthetic device (complete) (partial): Z97.2

## 2016-02-07 HISTORY — DX: Adverse effect of unspecified anesthetic, initial encounter: T41.45XA

## 2016-02-07 HISTORY — DX: Postpolio syndrome: G14

## 2016-02-07 SURGERY — PHACOEMULSIFICATION, CATARACT, WITH IOL INSERTION
Anesthesia: Monitor Anesthesia Care | Laterality: Left | Wound class: Clean

## 2016-02-07 MED ORDER — FENTANYL CITRATE (PF) 100 MCG/2ML IJ SOLN
INTRAMUSCULAR | Status: DC | PRN
  Administered 2016-02-07: 50 ug via INTRAVENOUS

## 2016-02-07 MED ORDER — POVIDONE-IODINE 5 % OP SOLN
1.0000 "application " | OPHTHALMIC | Status: DC | PRN
  Administered 2016-02-07: 1 via OPHTHALMIC

## 2016-02-07 MED ORDER — CEFUROXIME OPHTHALMIC INJECTION 1 MG/0.1 ML
INJECTION | OPHTHALMIC | Status: DC | PRN
  Administered 2016-02-07: 0.1 mL via OPHTHALMIC

## 2016-02-07 MED ORDER — NA HYALUR & NA CHOND-NA HYALUR 0.4-0.35 ML IO KIT
PACK | INTRAOCULAR | Status: DC | PRN
  Administered 2016-02-07: 1 mL via INTRAOCULAR

## 2016-02-07 MED ORDER — MIDAZOLAM HCL 2 MG/2ML IJ SOLN
INTRAMUSCULAR | Status: DC | PRN
Start: 2016-02-07 — End: 2016-02-07
  Administered 2016-02-07: 2 mg via INTRAVENOUS

## 2016-02-07 MED ORDER — TETRACAINE HCL 0.5 % OP SOLN
1.0000 [drp] | OPHTHALMIC | Status: DC | PRN
  Administered 2016-02-07: 1 [drp] via OPHTHALMIC

## 2016-02-07 MED ORDER — BRIMONIDINE TARTRATE 0.2 % OP SOLN
OPHTHALMIC | Status: DC | PRN
  Administered 2016-02-07: 1 [drp] via OPHTHALMIC

## 2016-02-07 MED ORDER — ARMC OPHTHALMIC DILATING GEL
1.0000 "application " | OPHTHALMIC | Status: DC | PRN
  Administered 2016-02-07 (×2): 1 via OPHTHALMIC

## 2016-02-07 MED ORDER — EPINEPHRINE HCL 1 MG/ML IJ SOLN
INTRAOCULAR | Status: DC | PRN
  Administered 2016-02-07: 56 mL via OPHTHALMIC

## 2016-02-07 SURGICAL SUPPLY — 26 items
CANNULA ANT/CHMB 27GA (MISCELLANEOUS) ×3 IMPLANT
CARTRIDGE ABBOTT (MISCELLANEOUS) ×3 IMPLANT
GLOVE SURG LX 7.5 STRW (GLOVE) ×2
GLOVE SURG LX STRL 7.5 STRW (GLOVE) ×1 IMPLANT
GLOVE SURG TRIUMPH 8.0 PF LTX (GLOVE) ×3 IMPLANT
GOWN STRL REUS W/ TWL LRG LVL3 (GOWN DISPOSABLE) ×2 IMPLANT
GOWN STRL REUS W/TWL LRG LVL3 (GOWN DISPOSABLE) ×4
LENS IOL TECNIS ITEC 19.0 (Intraocular Lens) ×3 IMPLANT
MARKER SKIN DUAL TIP RULER LAB (MISCELLANEOUS) ×3 IMPLANT
NDL RETROBULBAR .5 NSTRL (NEEDLE) IMPLANT
NEEDLE FILTER BLUNT 18X 1/2SAF (NEEDLE) ×2
NEEDLE FILTER BLUNT 18X1 1/2 (NEEDLE) ×1 IMPLANT
PACK CATARACT BRASINGTON (MISCELLANEOUS) ×3 IMPLANT
PACK EYE AFTER SURG (MISCELLANEOUS) ×3 IMPLANT
PACK OPTHALMIC (MISCELLANEOUS) ×3 IMPLANT
RING MALYGIN 7.0 (MISCELLANEOUS) IMPLANT
SUT ETHILON 10-0 CS-B-6CS-B-6 (SUTURE)
SUT VICRYL  9 0 (SUTURE)
SUT VICRYL 9 0 (SUTURE) IMPLANT
SUTURE EHLN 10-0 CS-B-6CS-B-6 (SUTURE) IMPLANT
SYR 3ML LL SCALE MARK (SYRINGE) ×3 IMPLANT
SYR 5ML LL (SYRINGE) IMPLANT
SYR TB 1ML LUER SLIP (SYRINGE) ×3 IMPLANT
WATER STERILE IRR 250ML POUR (IV SOLUTION) ×3 IMPLANT
WATER STERILE IRR 500ML POUR (IV SOLUTION) IMPLANT
WIPE NON LINTING 3.25X3.25 (MISCELLANEOUS) ×3 IMPLANT

## 2016-02-07 NOTE — H&P (Signed)
  The History and Physical notes are on paper, have been signed, and are to be scanned. The patient remains stable and unchanged from the H&P.   Previous H&P reviewed, patient examined, and there are no changes.  Marcie Shearon 02/07/2016 8:17 AM

## 2016-02-07 NOTE — Op Note (Signed)
OPERATIVE NOTE  Courtney Chandler [id number] 02/07/2016   PREOPERATIVE DIAGNOSIS:  Nuclear sclerotic cataract left eye. H25.12   POSTOPERATIVE DIAGNOSIS:    Nuclear sclerotic cataract left eye.     PROCEDURE:  Phacoemusification with posterior chamber intraocular lens placement of the left eye   LENS:   Implant Name Type Inv. Item Serial No. Manufacturer Lot No. LRB No. Used  [id number] 19.0D lens     [id number] [facility]   Left 1        ULTRASOUND TIME: 16.6  % of 0 minutes 53 seconds, CDE 8.9  SURGEON:  Deirdre Evenerhadwick R. Sharnese Heath, MD   ANESTHESIA:  Topical with tetracaine drops and 2% Xylocaine jelly.   COMPLICATIONS:  None.   DESCRIPTION OF PROCEDURE:  The patient was identified in the holding room and transported to the operating room and placed in the supine position under the operating microscope.  The left eye was identified as the operative eye and it was prepped and draped in the usual sterile ophthalmic fashion.   A 1 millimeter clear-corneal paracentesis was made at the 1:30 position.  The anterior chamber was filled with Viscoat viscoelastic.  A 2.4 millimeter keratome was used to make a near-clear corneal incision at the 10:30 position.  .  A curvilinear capsulorrhexis was made with a cystotome and capsulorrhexis forceps.  Balanced salt solution was used to hydrodissect and hydrodelineate the nucleus.   Phacoemulsification was then used in stop and chop fashion to remove the lens nucleus and epinucleus.  The remaining cortex was then removed using the irrigation and aspiration handpiece. Provisc was then placed into the capsular bag to distend it for lens placement.  A lens was then injected into the capsular bag.  The remaining viscoelastic was aspirated.   Wounds were hydrated with balanced salt solution.  The anterior chamber was inflated to a physiologic pressure with balanced salt solution.  No wound leaks were noted. Cefuroxime 0.1 ml of a 10mg /ml solution was injected into the  anterior chamber for a dose of 1 mg of intracameral antibiotic at the completion of the case.   Timolol and Brimonidine drops were applied to the eye.  The patient was taken to the recovery room in stable condition without complications of anesthesia or surgery.  [name] 02/07/2016, 8:58 AM

## 2016-02-07 NOTE — Anesthesia Preprocedure Evaluation (Signed)
Anesthesia Evaluation  Patient identified by MRN, date of birth, ID band Patient awake    Reviewed: Allergy & Precautions, NPO status , Patient's Chart, lab work & pertinent test results, reviewed documented beta blocker date and time   History of Anesthesia Complications (+) history of anesthetic complications (prolonged emergence)  Airway Mallampati: I  TM Distance: >3 FB Neck ROM: Full    Dental  (+) Partial Lower   Pulmonary asthma , sleep apnea and Continuous Positive Airway Pressure Ventilation , former smoker,    Pulmonary exam normal        Cardiovascular hypertension, Normal cardiovascular exam     Neuro/Psych PSYCHIATRIC DISORDERS Anxiety  Neuromuscular disease    GI/Hepatic   Endo/Other    Renal/GU      Musculoskeletal  (+) Arthritis , Osteoarthritis,    Abdominal   Peds  Hematology   Anesthesia Other Findings   Reproductive/Obstetrics                             Anesthesia Physical Anesthesia Plan  ASA: III  Anesthesia Plan: MAC   Post-op Pain Management:    Induction: Intravenous  Airway Management Planned:   Additional Equipment:   Intra-op Plan:   Post-operative Plan:   Informed Consent: I have reviewed the patients History and Physical, chart, labs and discussed the procedure including the risks, benefits and alternatives for the proposed anesthesia with the patient or authorized representative who has indicated his/her understanding and acceptance.     Plan Discussed with: CRNA  Anesthesia Plan Comments:         Anesthesia Quick Evaluation

## 2016-02-07 NOTE — Anesthesia Procedure Notes (Signed)
Procedure Name: MAC Performed by: Moniqua Engebretsen Pre-anesthesia Checklist: Patient identified, Emergency Drugs available, Suction available, Timeout performed and Patient being monitored Patient Re-evaluated:Patient Re-evaluated prior to inductionOxygen Delivery Method: Nasal cannula Placement Confirmation: positive ETCO2       

## 2016-02-07 NOTE — Transfer of Care (Signed)
Immediate Anesthesia Transfer of Care Note  Patient: Courtney Chandler  Procedure(s) Performed: Procedure(s) with comments: CATARACT EXTRACTION PHACO AND INTRAOCULAR LENS PLACEMENT (IOC) (Left) - CPAP  Patient Location: PACU  Anesthesia Type: MAC  Level of Consciousness: awake, alert  and patient cooperative  Airway and Oxygen Therapy: Patient Spontanous Breathing and Patient connected to supplemental oxygen  Post-op Assessment: Post-op Vital signs reviewed, Patient's Cardiovascular Status Stable, Respiratory Function Stable, Patent Airway and No signs of Nausea or vomiting  Post-op Vital Signs: Reviewed and stable  Complications: No apparent anesthesia complications

## 2016-02-07 NOTE — Anesthesia Postprocedure Evaluation (Signed)
Anesthesia Post Note  Patient: Courtney Chandler  Procedure(s) Performed: Procedure(s) (LRB): CATARACT EXTRACTION PHACO AND INTRAOCULAR LENS PLACEMENT (IOC) (Left)  Patient location during evaluation: PACU Anesthesia Type: MAC Level of consciousness: awake and alert and oriented Pain management: pain level controlled Vital Signs Assessment: post-procedure vital signs reviewed and stable Respiratory status: spontaneous breathing and nonlabored ventilation Cardiovascular status: stable Postop Assessment: no signs of nausea or vomiting and adequate PO intake Anesthetic complications: no    Harolyn RutherfordJoshua Kairah Leoni

## 2016-02-08 ENCOUNTER — Encounter: Payer: Self-pay | Admitting: Ophthalmology

## 2016-02-27 ENCOUNTER — Encounter: Payer: Self-pay | Admitting: *Deleted

## 2016-02-29 DIAGNOSIS — H2511 Age-related nuclear cataract, right eye: Secondary | ICD-10-CM | POA: Diagnosis not present

## 2016-03-05 NOTE — Discharge Instructions (Signed)

## 2016-03-06 ENCOUNTER — Ambulatory Visit
Admission: RE | Admit: 2016-03-06 | Discharge: 2016-03-06 | Disposition: A | Discharge status: Home / Self Care | Payer: Medicare Other | Source: Ambulatory Visit | Attending: Ophthalmology | Admitting: Ophthalmology

## 2016-03-06 ENCOUNTER — Encounter
Admission: RE | Disposition: A | Discharge status: Home / Self Care | Payer: Self-pay | Source: Ambulatory Visit | Attending: Ophthalmology

## 2016-03-06 ENCOUNTER — Ambulatory Visit: Payer: Medicare Other | Admitting: Anesthesiology

## 2016-03-06 DIAGNOSIS — Z9889 Other specified postprocedural states: Secondary | ICD-10-CM | POA: Insufficient documentation

## 2016-03-06 DIAGNOSIS — Z7982 Long term (current) use of aspirin: Secondary | ICD-10-CM | POA: Diagnosis not present

## 2016-03-06 DIAGNOSIS — Z888 Allergy status to other drugs, medicaments and biological substances status: Secondary | ICD-10-CM | POA: Insufficient documentation

## 2016-03-06 DIAGNOSIS — K449 Diaphragmatic hernia without obstruction or gangrene: Secondary | ICD-10-CM | POA: Diagnosis not present

## 2016-03-06 DIAGNOSIS — Z885 Allergy status to narcotic agent status: Secondary | ICD-10-CM | POA: Insufficient documentation

## 2016-03-06 DIAGNOSIS — H2511 Age-related nuclear cataract, right eye: Secondary | ICD-10-CM | POA: Insufficient documentation

## 2016-03-06 DIAGNOSIS — G4730 Sleep apnea, unspecified: Secondary | ICD-10-CM | POA: Diagnosis not present

## 2016-03-06 DIAGNOSIS — Z881 Allergy status to other antibiotic agents status: Secondary | ICD-10-CM | POA: Diagnosis not present

## 2016-03-06 DIAGNOSIS — M8580 Other specified disorders of bone density and structure, unspecified site: Secondary | ICD-10-CM | POA: Insufficient documentation

## 2016-03-06 DIAGNOSIS — Z79899 Other long term (current) drug therapy: Secondary | ICD-10-CM | POA: Diagnosis not present

## 2016-03-06 DIAGNOSIS — J45909 Unspecified asthma, uncomplicated: Secondary | ICD-10-CM | POA: Insufficient documentation

## 2016-03-06 DIAGNOSIS — Z87891 Personal history of nicotine dependence: Secondary | ICD-10-CM | POA: Diagnosis not present

## 2016-03-06 DIAGNOSIS — M1990 Unspecified osteoarthritis, unspecified site: Secondary | ICD-10-CM | POA: Insufficient documentation

## 2016-03-06 DIAGNOSIS — H269 Unspecified cataract: Secondary | ICD-10-CM | POA: Diagnosis present

## 2016-03-06 DIAGNOSIS — G14 Postpolio syndrome: Secondary | ICD-10-CM | POA: Diagnosis not present

## 2016-03-06 HISTORY — PX: CATARACT EXTRACTION W/PHACO: SHX586

## 2016-03-06 SURGERY — PHACOEMULSIFICATION, CATARACT, WITH IOL INSERTION
Anesthesia: Monitor Anesthesia Care | Laterality: Right | Wound class: Clean

## 2016-03-06 MED ORDER — LIDOCAINE HCL (PF) 4 % IJ SOLN
INTRAOCULAR | Status: DC | PRN
  Administered 2016-03-06: 1 mL via OPHTHALMIC

## 2016-03-06 MED ORDER — MIDAZOLAM HCL 2 MG/2ML IJ SOLN
INTRAMUSCULAR | Status: DC | PRN
  Administered 2016-03-06: 2 mg via INTRAVENOUS

## 2016-03-06 MED ORDER — ACETAMINOPHEN 160 MG/5ML PO SOLN: 325.0000 mg | ORAL | Status: DC | PRN

## 2016-03-06 MED ORDER — FENTANYL CITRATE (PF) 100 MCG/2ML IJ SOLN
INTRAMUSCULAR | Status: DC | PRN
  Administered 2016-03-06: 100 ug via INTRAVENOUS

## 2016-03-06 MED ORDER — ARMC OPHTHALMIC DILATING GEL
1.0000 "application " | OPHTHALMIC | Status: DC | PRN
  Administered 2016-03-06 (×2): 1 via OPHTHALMIC

## 2016-03-06 MED ORDER — POVIDONE-IODINE 5 % OP SOLN
1.0000 "application " | OPHTHALMIC | Status: DC | PRN
  Administered 2016-03-06: 1 via OPHTHALMIC

## 2016-03-06 MED ORDER — CEFUROXIME OPHTHALMIC INJECTION 1 MG/0.1 ML
INJECTION | OPHTHALMIC | Status: DC | PRN
  Administered 2016-03-06: 0.1 mL via INTRACAMERAL

## 2016-03-06 MED ORDER — NA HYALUR & NA CHOND-NA HYALUR 0.4-0.35 ML IO KIT
PACK | INTRAOCULAR | Status: DC | PRN
  Administered 2016-03-06: 1 mL via INTRAOCULAR

## 2016-03-06 MED ORDER — ACETAMINOPHEN 325 MG PO TABS: 325.0000 mg | ORAL_TABLET | ORAL | Status: DC | PRN

## 2016-03-06 MED ORDER — CARBACHOL 0.01 % IO SOLN
INTRAOCULAR | Status: DC | PRN
  Administered 2016-03-06: 0.5 mL via INTRAOCULAR

## 2016-03-06 MED ORDER — EPINEPHRINE HCL 1 MG/ML IJ SOLN
INTRAMUSCULAR | Status: DC | PRN
  Administered 2016-03-06: 65 mL via OPHTHALMIC

## 2016-03-06 MED ORDER — TETRACAINE HCL 0.5 % OP SOLN
1.0000 [drp] | OPHTHALMIC | Status: DC | PRN
  Administered 2016-03-06: 1 [drp] via OPHTHALMIC

## 2016-03-06 SURGICAL SUPPLY — 23 items
CANNULA ANT/CHMB 27GA (MISCELLANEOUS) ×6 IMPLANT
CARTRIDGE ABBOTT (MISCELLANEOUS) IMPLANT
GLOVE SURG LX 7.5 STRW (GLOVE) ×2
GLOVE SURG LX STRL 7.5 STRW (GLOVE) ×1 IMPLANT
GLOVE SURG TRIUMPH 8.0 PF LTX (GLOVE) ×3 IMPLANT
GOWN STRL REUS W/ TWL LRG LVL3 (GOWN DISPOSABLE) ×2 IMPLANT
GOWN STRL REUS W/TWL LRG LVL3 (GOWN DISPOSABLE) ×4
LENS IOL TECNIS ITEC 20.0 (Intraocular Lens) ×3 IMPLANT
MARKER SKIN DUAL TIP RULER LAB (MISCELLANEOUS) ×3 IMPLANT
NDL RETROBULBAR .5 NSTRL (NEEDLE) IMPLANT
NEEDLE FILTER BLUNT 18X 1/2SAF (NEEDLE) ×6
NEEDLE FILTER BLUNT 18X1 1/2 (NEEDLE) ×3 IMPLANT
PACK CATARACT BRASINGTON (MISCELLANEOUS) ×3 IMPLANT
PACK EYE AFTER SURG (MISCELLANEOUS) ×3 IMPLANT
PACK OPTHALMIC (MISCELLANEOUS) ×3 IMPLANT
RING MALYGIN 7.0 (MISCELLANEOUS) IMPLANT
SUT ETHILON 10-0 CS-B-6CS-B-6 (SUTURE)
SUT VICRYL  9 0 (SUTURE)
SUT VICRYL 9 0 (SUTURE) IMPLANT
SUTURE EHLN 10-0 CS-B-6CS-B-6 (SUTURE) IMPLANT
SYR TB 1ML LUER SLIP (SYRINGE) ×6 IMPLANT
WATER STERILE IRR 250ML POUR (IV SOLUTION) ×3 IMPLANT
WIPE NON LINTING 3.25X3.25 (MISCELLANEOUS) ×3 IMPLANT

## 2016-03-06 NOTE — Anesthesia Preprocedure Evaluation (Addendum)
Anesthesia Evaluation  Patient identified by MRN, date of birth, ID band Patient awake    Reviewed: Allergy & Precautions, NPO status , Patient's Chart, lab work & pertinent test results, reviewed documented beta blocker date and time   History of Anesthesia Complications (+) history of anesthetic complications (prolonged emergence)  Airway Mallampati: I  TM Distance: >3 FB Neck ROM: Full    Dental  (+) Partial Lower   Pulmonary asthma , sleep apnea and Continuous Positive Airway Pressure Ventilation , former smoker,    Pulmonary exam normal        Cardiovascular hypertension, Normal cardiovascular exam     Neuro/Psych PSYCHIATRIC DISORDERS  Neuromuscular disease    GI/Hepatic   Endo/Other    Renal/GU      Musculoskeletal  (+) Arthritis , Osteoarthritis,    Abdominal   Peds  Hematology   Anesthesia Other Findings   Reproductive/Obstetrics                            Anesthesia Physical  Anesthesia Plan  ASA: III  Anesthesia Plan: MAC   Post-op Pain Management:    Induction: Intravenous  Airway Management Planned:   Additional Equipment:   Intra-op Plan:   Post-operative Plan:   Informed Consent: I have reviewed the patients History and Physical, chart, labs and discussed the procedure including the risks, benefits and alternatives for the proposed anesthesia with the patient or authorized representative who has indicated his/her understanding and acceptance.     Plan Discussed with: CRNA  Anesthesia Plan Comments:         Anesthesia Quick Evaluation                                  Anesthesia Evaluation  Patient identified by MRN, date of birth, ID band Patient awake    Reviewed: Allergy & Precautions, NPO status , Patient's Chart, lab work & pertinent test results, reviewed documented beta blocker date and time   History of Anesthesia Complications (+)  history of anesthetic complications (prolonged emergence)  Airway Mallampati: I  TM Distance: >3 FB Neck ROM: Full    Dental  (+) Partial Lower   Pulmonary asthma , sleep apnea and Continuous Positive Airway Pressure Ventilation , former smoker,    Pulmonary exam normal        Cardiovascular hypertension, Normal cardiovascular exam     Neuro/Psych PSYCHIATRIC DISORDERS Anxiety  Neuromuscular disease    GI/Hepatic   Endo/Other    Renal/GU      Musculoskeletal  (+) Arthritis , Osteoarthritis,    Abdominal   Peds  Hematology   Anesthesia Other Findings   Reproductive/Obstetrics                             Anesthesia Physical Anesthesia Plan  ASA: III  Anesthesia Plan: MAC   Post-op Pain Management:    Induction: Intravenous  Airway Management Planned:   Additional Equipment:   Intra-op Plan:   Post-operative Plan:   Informed Consent: I have reviewed the patients History and Physical, chart, labs and discussed the procedure including the risks, benefits and alternatives for the proposed anesthesia with the patient or authorized representative who has indicated his/her understanding and acceptance.     Plan Discussed with: CRNA  Anesthesia Plan Comments:  Anesthesia Quick Evaluation

## 2016-03-06 NOTE — Anesthesia Procedure Notes (Signed)
Procedure Name: MAC Performed by: Javaria Knapke Pre-anesthesia Checklist: Patient identified, Emergency Drugs available, Suction available, Timeout performed and Patient being monitored Patient Re-evaluated:Patient Re-evaluated prior to inductionOxygen Delivery Method: Nasal cannula Placement Confirmation: positive ETCO2     

## 2016-03-06 NOTE — Op Note (Signed)
LOCATION:  [facility]   PREOPERATIVE DIAGNOSIS:    Nuclear sclerotic cataract right eye. H25.11   POSTOPERATIVE DIAGNOSIS:  Nuclear sclerotic cataract right eye.     PROCEDURE:  Phacoemusification with posterior chamber intraocular lens placement of the right eye   LENS:   Implant Name Type Inv. Item Serial No. Manufacturer Lot No. LRB No. Used  LENS IOL DIOP 20.0 - [id number] Intraocular Lens LENS IOL DIOP 20.0 [id number]   Right 1        ULTRASOUND TIME: 12.5 % of 1 minutes, 4 seconds.  CDE 8.0   SURGEON:  Deirdre Evenerhadwick R. Kerin Kren, MD   ANESTHESIA:  Topical with tetracaine drops and 2% Xylocaine jelly, augmented with 1% preservative-free intracameral lidocaine.    COMPLICATIONS:  None.   DESCRIPTION OF PROCEDURE:  The patient was identified in the holding room and transported to the operating room and placed in the supine position under the operating microscope.  The right eye was identified as the operative eye and it was prepped and draped in the usual sterile ophthalmic fashion.   A 1 millimeter clear-corneal paracentesis was made at the 12:00 position.  0.5 ml of preservative-free 1% lidocaine was injected into the anterior chamber. The anterior chamber was filled with Viscoat viscoelastic.  A 2.4 millimeter keratome was used to make a near-clear corneal incision at the 9:00 position.  A curvilinear capsulorrhexis was made with a cystotome and capsulorrhexis forceps.  Balanced salt solution was used to hydrodissect and hydrodelineate the nucleus.   Phacoemulsification was then used in stop and chop fashion to remove the lens nucleus and epinucleus.  The remaining cortex was then removed using the irrigation and aspiration handpiece. Provisc was then placed into the capsular bag to distend it for lens placement.  A lens was then injected into the capsular bag.  The remaining viscoelastic was aspirated.   Wounds were hydrated with balanced salt solution.  Miostat was  placed into the anterior chamber.  The anterior chamber was inflated to a physiologic pressure with balanced salt solution.  No wound leaks were noted. Cefuroxime 0.1 ml of a 10mg /ml solution was injected into the anterior chamber for a dose of 1 mg of intracameral antibiotic at the completion of the case.   The patient was taken to the recovery room in stable condition without complications of anesthesia or surgery.   Jewell Haught 03/06/2016, 10:19 AM

## 2016-03-06 NOTE — H&P (Signed)
  The History and Physical notes are on paper, have been signed, and are to be scanned. The patient remains stable and unchanged from the H&P.   Previous H&P reviewed, patient examined, and there are no changes.  Oneal Biglow 03/06/2016 9:27 AM

## 2016-03-06 NOTE — Transfer of Care (Signed)
Immediate Anesthesia Transfer of Care Note  Patient: Courtney Chandler  Procedure(s) Performed: Procedure(s) with comments: CATARACT EXTRACTION PHACO AND INTRAOCULAR LENS PLACEMENT (IOC) (Right) - CPAP  Patient Location: PACU  Anesthesia Type: MAC  Level of Consciousness: awake, alert  and patient cooperative  Airway and Oxygen Therapy: Patient Spontanous Breathing and Patient connected to supplemental oxygen  Post-op Assessment: Post-op Vital signs reviewed, Patient's Cardiovascular Status Stable, Respiratory Function Stable, Patent Airway and No signs of Nausea or vomiting  Post-op Vital Signs: Reviewed and stable  Complications: No apparent anesthesia complications

## 2016-03-06 NOTE — Anesthesia Postprocedure Evaluation (Signed)
Anesthesia Post Note  Patient: Courtney Chandler  Procedure(s) Performed: Procedure(s) (LRB): CATARACT EXTRACTION PHACO AND INTRAOCULAR LENS PLACEMENT (IOC) (Right)  Patient location during evaluation: PACU Anesthesia Type: MAC Level of consciousness: awake and alert Pain management: pain level controlled Vital Signs Assessment: post-procedure vital signs reviewed and stable Respiratory status: spontaneous breathing, nonlabored ventilation and respiratory function stable Cardiovascular status: blood pressure returned to baseline and stable Postop Assessment: no signs of nausea or vomiting Anesthetic complications: no    Suda Forbess D Cohen Boettner

## 2016-03-07 ENCOUNTER — Encounter: Payer: Self-pay | Admitting: Ophthalmology

## 2016-03-19 ENCOUNTER — Telehealth: Payer: Self-pay | Admitting: Family Medicine

## 2016-03-19 NOTE — Telephone Encounter (Signed)
Pt called She found tick on her stomach on Sunday and another on back of left knee on Monday She wanted to know if she needs medical attention. One on stomach 1/4" pink spot not hot to touch Knee tiny spot where she removed tick Please advise

## 2016-03-19 NOTE — Telephone Encounter (Signed)
No treatment needed other than cortisone OTC cream on itchy bites. Be aware for neuro changes, headachem feer neck pain in 2 weeks when symptoms would start for tick borne illness.

## 2016-03-19 NOTE — Telephone Encounter (Signed)
[name] notified as instructed by telephone.

## 2016-07-18 DIAGNOSIS — H401131 Primary open-angle glaucoma, bilateral, mild stage: Secondary | ICD-10-CM | POA: Diagnosis not present

## 2016-08-09 ENCOUNTER — Other Ambulatory Visit: Payer: Self-pay | Admitting: *Deleted

## 2016-08-09 ENCOUNTER — Telehealth: Payer: Self-pay

## 2016-08-09 MED ORDER — ALBUTEROL SULFATE HFA 108 (90 BASE) MCG/ACT IN AERS: INHALATION_SPRAY | RESPIRATORY_TRACT | 0 refills | Status: DC

## 2016-08-09 MED ORDER — MONTELUKAST SODIUM 10 MG PO TABS: 10.0000 mg | ORAL_TABLET | Freq: Every day | ORAL | 0 refills | Status: DC

## 2016-08-09 MED ORDER — FLUTICASONE-SALMETEROL 500-50 MCG/DOSE IN AEPB: INHALATION_SPRAY | RESPIRATORY_TRACT | 0 refills | Status: DC

## 2016-08-09 NOTE — Telephone Encounter (Signed)
FYI: patient is calling to notify us that we should be receiving a request for all of her medications to be [facility] order with [facility].      [facility] to [facility], [facility] on all medications.  She wanted us to be aware that she did approve this transfer.

## 2016-08-09 NOTE — Telephone Encounter (Signed)
Pharmacy update in Medical Record.  We have already sent ProAir, Advair & Singulair into [facility] earlier today.

## 2016-09-26 ENCOUNTER — Telehealth: Payer: Self-pay | Admitting: Family Medicine

## 2016-09-26 DIAGNOSIS — E7800 Pure hypercholesterolemia, unspecified: Secondary | ICD-10-CM

## 2016-09-26 DIAGNOSIS — M8589 Other specified disorders of bone density and structure, multiple sites: Secondary | ICD-10-CM

## 2016-09-26 NOTE — Telephone Encounter (Signed)
-----   Message from Alvina Chouerri J Walsh sent at 09/25/2016 10:41 AM EST ----- Regarding: Lab orders for Tuesdday, 11.21.17 Patient is scheduled for CPX labs, please order future labs, Thanks , Camelia Engerri

## 2016-10-01 ENCOUNTER — Other Ambulatory Visit (INDEPENDENT_AMBULATORY_CARE_PROVIDER_SITE_OTHER): Payer: Medicare Other

## 2016-10-01 DIAGNOSIS — M8589 Other specified disorders of bone density and structure, multiple sites: Secondary | ICD-10-CM | POA: Diagnosis not present

## 2016-10-01 DIAGNOSIS — E7800 Pure hypercholesterolemia, unspecified: Secondary | ICD-10-CM | POA: Diagnosis not present

## 2016-10-01 LAB — VITAMIN D 25 HYDROXY (VIT D DEFICIENCY, FRACTURES): VITD: 40 ng/mL (ref 30.00–100.00)

## 2016-10-01 LAB — COMPREHENSIVE METABOLIC PANEL
ALBUMIN: 4.1 g/dL (ref 3.5–5.2)
ALT: 14 U/L (ref 0–35)
AST: 14 U/L (ref 0–37)
Alkaline Phosphatase: 53 U/L (ref 39–117)
BILIRUBIN TOTAL: 0.6 mg/dL (ref 0.2–1.2)
BUN: 14 mg/dL (ref 6–23)
CALCIUM: 9.9 mg/dL (ref 8.4–10.5)
CO2: 28 mEq/L (ref 19–32)
CREATININE: 0.82 mg/dL (ref 0.40–1.20)
Chloride: 103 mEq/L (ref 96–112)
GFR: 72.36 mL/min (ref >60.00)
Glucose, Bld: 86 mg/dL (ref 70–99)
Potassium: 3.7 mEq/L (ref 3.5–5.1)
Sodium: 138 mEq/L (ref 135–145)
Total Protein: 7.2 g/dL (ref 6.0–8.3)

## 2016-10-01 LAB — LIPID PANEL
CHOLESTEROL: 218 mg/dL — AB (ref 0–200)
HDL: 78.3 mg/dL (ref >39.00)
LDL Cholesterol: 110 mg/dL — ABNORMAL HIGH (ref 0–99)
NonHDL: 140.11
TRIGLYCERIDES: 153 mg/dL — AB (ref 0.0–149.0)
Total CHOL/HDL Ratio: 3
VLDL: 30.6 mg/dL (ref 0.0–40.0)

## 2016-10-10 ENCOUNTER — Ambulatory Visit (INDEPENDENT_AMBULATORY_CARE_PROVIDER_SITE_OTHER): Payer: Medicare Other | Admitting: Family Medicine

## 2016-10-10 ENCOUNTER — Encounter: Payer: Self-pay | Admitting: Family Medicine

## 2016-10-10 VITALS — BP 148/59 | HR 76 | Temp 98.5°F | Ht 60.5 in | Wt 126.8 lb

## 2016-10-10 DIAGNOSIS — Z0000 Encounter for general adult medical examination without abnormal findings: Secondary | ICD-10-CM | POA: Diagnosis not present

## 2016-10-10 DIAGNOSIS — I10 Essential (primary) hypertension: Secondary | ICD-10-CM

## 2016-10-10 DIAGNOSIS — E7800 Pure hypercholesterolemia, unspecified: Secondary | ICD-10-CM | POA: Diagnosis not present

## 2016-10-10 DIAGNOSIS — Z23 Encounter for immunization: Secondary | ICD-10-CM

## 2016-10-10 DIAGNOSIS — K209 Esophagitis, unspecified without bleeding: Secondary | ICD-10-CM

## 2016-10-10 DIAGNOSIS — J4540 Moderate persistent asthma, uncomplicated: Secondary | ICD-10-CM

## 2016-10-10 DIAGNOSIS — B91 Sequelae of poliomyelitis: Secondary | ICD-10-CM

## 2016-10-10 DIAGNOSIS — Z66 Do not resuscitate: Secondary | ICD-10-CM

## 2016-10-10 NOTE — Assessment & Plan Note (Signed)
Resolved.. Pt will try to wean off nexium. Now off motrin.

## 2016-10-10 NOTE — Progress Notes (Signed)
Pre visit review using our clinic review tool, if applicable. No additional management support is needed unless otherwise documented below in the visit note. 

## 2016-10-10 NOTE — Progress Notes (Signed)
Subjective:    Patient ID: Courtney Chandler, female    DOB: 11/21/1940, 75 y.o.   MRN: [id number]  HPI I have personally reviewed the Medicare Annual Wellness questionnaire and have noted 1. The patient's medical and social history 2. Their use of alcohol, tobacco or illicit drugs 3. Their current medications and supplements 4. The patient's functional ability including ADL's, fall risks, home safety risks and hearing or visual             impairment. 5. Diet and physical activities 6. Evidence for depression or mood disorders 7.         Updated provider list Cognitive evaluation was performed and recorded on pt medicare questionnaire form. The patients weight, height, BMI and visual acuity have been recorded in the chart  I have made referrals, counseling and provided education to the patient based review of the above and I have provided the pt with a written personalized care plan for preventive services.   Asthma, moderate: On  high dose advair, singulair  Has had increase in need for albuterol  ( using 1 puff daily at bedtime given coughing at night, every night in last few weekswith leaves on the ground.  Esophagitis per GI. ENDO 2015: on nexium. She would like to try to taper off.  Hypertension:   well controlled at home on amlodipine and maxide. May be up given used albuterol today.  BP Readings from Last 3 Encounters:  10/10/16 (!) 148/59  03/06/16 137/72  02/07/16 132/67  Using medication without problems or lightheadedness:  none Chest pain with exertion: None Edema:None Short of breath: see above Average home BPs: good Other issues:  Elevated Cholesterol:  LDL at goal < 130 on fish oil. Red yeast rice causes constipation. Not interested in statin meds Lab Results  Component Value Date   CHOL 218 (H) 10/01/2016   HDL 78.30 10/01/2016   LDLCALC 110 (H) 10/01/2016   LDLDIRECT 168.0 09/24/2013   TRIG 153.0 (H) 10/01/2016   CHOLHDL 3 10/01/2016  Using medications  without problems: Muscle aches:  Diet compliance: She has become mostly vegetarian Some of her new diet may be high in cholesterol.n. Exercise: walking daily Other complaints:  Hiatal hernia, dysmotiity, chronic .Marland Kitchen. She reports choking and swallowing is much better. She feels it is better with improvement in polio. 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 She is feeling better with thinking and energy as well. She has had strengthening in muscles and no longer needs her leg brace as much.   She is able to walk a mile.  She still does have pain in legs.  Sleep apnea on CPAP.  Social History /Family History/Past Medical History reviewed and updated if needed.  Review of Systems     Objective:   Physical Exam        Assessment & Plan:  The patient's preventative maintenance and recommended screening tests for an annual wellness exam were reviewed in full today. Brought up to date unless services declined.  Counselled on the importance of diet, exercise, and its role in overall health and mortality. The patient's FH and SH was reviewed, including their home life, tobacco status, and drug and alcohol status.   Vaccines:Uptodate PNA, prevnar Td, shingles, given flu today  Pap/DVE:  Not indicated Mammo: 12/2014 normal, repeat in 2 years (2018) Bone Density:2013 osteopenia. Vit D Recheck in 5 years. Colon: Colonoscopy: 02/2015 hemorrhoids are cause  of positive IFOB, repeat colonoscopy  not indicated. Smoking Status: none ETOH/ drug use: none She has advance directives.Marland Kitchen. Updated DNR.

## 2016-10-10 NOTE — Assessment & Plan Note (Signed)
Encouraged exercise, weight loss, healthy eating habits. ? ?

## 2016-10-10 NOTE — Assessment & Plan Note (Signed)
Reviewed and updated, scanned in chart.

## 2016-10-10 NOTE — Assessment & Plan Note (Signed)
Doing strengthening exercises.  Using ASA for pain.

## 2016-10-10 NOTE — Assessment & Plan Note (Signed)
Inadequate control due to allergies on advair and singulair.  Add antihistamine.

## 2016-10-10 NOTE — Patient Instructions (Addendum)
Consider trial at bedtime  Of either generic zyrtec or Xyzal for allergies and to improve ashtma.  Try to decrease use of albuterol.  Call if symptoms not improving.

## 2016-10-10 NOTE — Assessment & Plan Note (Signed)
Well controlled. Continue current medication.  

## 2016-10-18 ENCOUNTER — Other Ambulatory Visit: Payer: Self-pay

## 2016-10-21 ENCOUNTER — Other Ambulatory Visit: Payer: Self-pay | Admitting: Family Medicine

## 2017-01-09 DIAGNOSIS — H401131 Primary open-angle glaucoma, bilateral, mild stage: Secondary | ICD-10-CM | POA: Diagnosis not present

## 2017-01-16 ENCOUNTER — Other Ambulatory Visit: Payer: Self-pay | Admitting: Family Medicine

## 2017-01-27 DIAGNOSIS — H401131 Primary open-angle glaucoma, bilateral, mild stage: Secondary | ICD-10-CM | POA: Diagnosis not present

## 2017-06-21 ENCOUNTER — Inpatient Hospital Stay
Admission: EM | Admit: 2017-06-21 | Discharge: 2017-06-23 | DRG: 440 | Disposition: A | Discharge status: Home / Self Care | Payer: Medicare Other | Attending: Internal Medicine | Admitting: Internal Medicine

## 2017-06-21 ENCOUNTER — Inpatient Hospital Stay: Payer: Medicare Other

## 2017-06-21 ENCOUNTER — Encounter: Payer: Self-pay | Admitting: Emergency Medicine

## 2017-06-21 DIAGNOSIS — Z79899 Other long term (current) drug therapy: Secondary | ICD-10-CM | POA: Diagnosis not present

## 2017-06-21 DIAGNOSIS — E8809 Other disorders of plasma-protein metabolism, not elsewhere classified: Secondary | ICD-10-CM | POA: Diagnosis present

## 2017-06-21 DIAGNOSIS — F419 Anxiety disorder, unspecified: Secondary | ICD-10-CM | POA: Diagnosis present

## 2017-06-21 DIAGNOSIS — K859 Acute pancreatitis without necrosis or infection, unspecified: Secondary | ICD-10-CM | POA: Diagnosis not present

## 2017-06-21 DIAGNOSIS — R112 Nausea with vomiting, unspecified: Secondary | ICD-10-CM

## 2017-06-21 DIAGNOSIS — R197 Diarrhea, unspecified: Secondary | ICD-10-CM | POA: Diagnosis not present

## 2017-06-21 DIAGNOSIS — M549 Dorsalgia, unspecified: Secondary | ICD-10-CM | POA: Diagnosis present

## 2017-06-21 DIAGNOSIS — Z881 Allergy status to other antibiotic agents status: Secondary | ICD-10-CM

## 2017-06-21 DIAGNOSIS — Z7951 Long term (current) use of inhaled steroids: Secondary | ICD-10-CM | POA: Diagnosis not present

## 2017-06-21 DIAGNOSIS — B91 Sequelae of poliomyelitis: Secondary | ICD-10-CM

## 2017-06-21 DIAGNOSIS — Z87891 Personal history of nicotine dependence: Secondary | ICD-10-CM | POA: Diagnosis not present

## 2017-06-21 DIAGNOSIS — G8929 Other chronic pain: Secondary | ICD-10-CM | POA: Diagnosis present

## 2017-06-21 DIAGNOSIS — Z23 Encounter for immunization: Secondary | ICD-10-CM | POA: Diagnosis present

## 2017-06-21 DIAGNOSIS — G4733 Obstructive sleep apnea (adult) (pediatric): Secondary | ICD-10-CM | POA: Diagnosis present

## 2017-06-21 DIAGNOSIS — I10 Essential (primary) hypertension: Secondary | ICD-10-CM | POA: Diagnosis present

## 2017-06-21 DIAGNOSIS — K8500 Idiopathic acute pancreatitis without necrosis or infection: Principal | ICD-10-CM | POA: Diagnosis present

## 2017-06-21 DIAGNOSIS — K449 Diaphragmatic hernia without obstruction or gangrene: Secondary | ICD-10-CM | POA: Diagnosis not present

## 2017-06-21 DIAGNOSIS — Z888 Allergy status to other drugs, medicaments and biological substances status: Secondary | ICD-10-CM | POA: Diagnosis not present

## 2017-06-21 DIAGNOSIS — J45909 Unspecified asthma, uncomplicated: Secondary | ICD-10-CM | POA: Diagnosis present

## 2017-06-21 DIAGNOSIS — Z66 Do not resuscitate: Secondary | ICD-10-CM | POA: Diagnosis present

## 2017-06-21 DIAGNOSIS — R1084 Generalized abdominal pain: Secondary | ICD-10-CM | POA: Diagnosis not present

## 2017-06-21 DIAGNOSIS — Z885 Allergy status to narcotic agent status: Secondary | ICD-10-CM | POA: Diagnosis not present

## 2017-06-21 DIAGNOSIS — G14 Postpolio syndrome: Secondary | ICD-10-CM | POA: Insufficient documentation

## 2017-06-21 HISTORY — DX: Scarlet fever, uncomplicated: A38.9

## 2017-06-21 LAB — URINALYSIS, COMPLETE (UACMP) WITH MICROSCOPIC
BILIRUBIN URINE: NEGATIVE
Bacteria, UA: NONE SEEN
GLUCOSE, UA: NEGATIVE mg/dL
Hgb urine dipstick: NEGATIVE
KETONES UR: 20 mg/dL — AB
LEUKOCYTES UA: NEGATIVE
Nitrite: NEGATIVE
PH: 6 (ref 5.0–8.0)
Protein, ur: 30 mg/dL — AB
SPECIFIC GRAVITY, URINE: 1.017 (ref 1.005–1.030)

## 2017-06-21 LAB — CBC
HCT: 47.1 % — ABNORMAL HIGH (ref 35.0–47.0)
Hemoglobin: 16 g/dL (ref 12.0–16.0)
MCH: 32.3 pg (ref 26.0–34.0)
MCHC: 33.9 g/dL (ref 32.0–36.0)
MCV: 95.3 fL (ref 80.0–100.0)
PLATELETS: 307 10*3/uL (ref 150–440)
RBC: 4.95 MIL/uL (ref 3.80–5.20)
RDW: 13.3 % (ref 11.5–14.5)
WBC: 15.3 10*3/uL — AB (ref 3.6–11.0)

## 2017-06-21 LAB — LIPID PANEL
CHOL/HDL RATIO: 2.1 ratio
CHOLESTEROL: 210 mg/dL — AB (ref 0–200)
HDL: 101 mg/dL (ref >40)
LDL CALC: 94 mg/dL (ref 0–99)
Triglycerides: 77 mg/dL (ref <150)
VLDL: 15 mg/dL (ref 0–40)

## 2017-06-21 LAB — COMPREHENSIVE METABOLIC PANEL
ALBUMIN: 4.1 g/dL (ref 3.5–5.0)
ALT: 19 U/L (ref 14–54)
ANION GAP: 11 (ref 5–15)
AST: 29 U/L (ref 15–41)
Alkaline Phosphatase: 65 U/L (ref 38–126)
BUN: 11 mg/dL (ref 6–20)
CALCIUM: 9.4 mg/dL (ref 8.9–10.3)
CHLORIDE: 100 mmol/L — AB (ref 101–111)
CO2: 24 mmol/L (ref 22–32)
CREATININE: 0.81 mg/dL (ref 0.44–1.00)
GFR calc non Af Amer: >60 mL/min (ref >60)
GLUCOSE: 123 mg/dL — AB (ref 65–99)
Potassium: 3.5 mmol/L (ref 3.5–5.1)
SODIUM: 135 mmol/L (ref 135–145)
Total Bilirubin: 1.2 mg/dL (ref 0.3–1.2)
Total Protein: 8.2 g/dL — ABNORMAL HIGH (ref 6.5–8.1)

## 2017-06-21 LAB — LIPASE, BLOOD: LIPASE: 523 U/L — AB (ref 11–51)

## 2017-06-21 LAB — TROPONIN I: Troponin I: <0.03 ng/mL (ref <0.03)

## 2017-06-21 MED ORDER — ONDANSETRON HCL 4 MG/2ML IJ SOLN
4.0000 mg | Freq: Four times a day (QID) | INTRAMUSCULAR | Status: DC | PRN
  Administered 2017-06-21 – 2017-06-22 (×3): 4 mg via INTRAVENOUS
  Filled 2017-06-21 (×3): qty 2

## 2017-06-21 MED ORDER — SODIUM CHLORIDE 0.9 % IV BOLUS (SEPSIS)
1000.0000 mL | Freq: Once | INTRAVENOUS | Status: AC
  Administered 2017-06-21: 1000 mL via INTRAVENOUS
  Filled 2017-06-21: qty 1000

## 2017-06-21 MED ORDER — ONDANSETRON HCL 4 MG PO TABS
4.0000 mg | ORAL_TABLET | Freq: Four times a day (QID) | ORAL | Status: DC | PRN
  Administered 2017-06-22: 4 mg via ORAL
  Filled 2017-06-21: qty 1

## 2017-06-21 MED ORDER — ALBUTEROL SULFATE (2.5 MG/3ML) 0.083% IN NEBU
3.0000 mL | INHALATION_SOLUTION | RESPIRATORY_TRACT | Status: DC | PRN
  Administered 2017-06-22 (×2): 3 mL via RESPIRATORY_TRACT
  Filled 2017-06-21 (×2): qty 3

## 2017-06-21 MED ORDER — HEPARIN SODIUM (PORCINE) 5000 UNIT/ML IJ SOLN
5000.0000 [IU] | Freq: Three times a day (TID) | INTRAMUSCULAR | Status: DC
  Administered 2017-06-21 – 2017-06-23 (×5): 5000 [IU] via SUBCUTANEOUS
  Filled 2017-06-21 (×5): qty 1

## 2017-06-21 MED ORDER — ONDANSETRON HCL 4 MG/2ML IJ SOLN
4.0000 mg | Freq: Once | INTRAMUSCULAR | Status: AC
  Administered 2017-06-21: 4 mg via INTRAVENOUS
  Filled 2017-06-21: qty 2

## 2017-06-21 MED ORDER — PNEUMOCOCCAL VAC POLYVALENT 25 MCG/0.5ML IJ INJ
0.5000 mL | INJECTION | INTRAMUSCULAR | Status: AC
Start: 2017-06-22 — End: 2017-06-22
  Administered 2017-06-22: 0.5 mL via INTRAMUSCULAR
  Filled 2017-06-21: qty 0.5

## 2017-06-21 MED ORDER — LATANOPROST 0.005 % OP SOLN
1.0000 [drp] | Freq: Every day | OPHTHALMIC | Status: DC
  Administered 2017-06-21 – 2017-06-22 (×2): 1 [drp] via OPHTHALMIC
  Filled 2017-06-21: qty 2.5

## 2017-06-21 MED ORDER — IOPAMIDOL (ISOVUE-300) INJECTION 61%
100.0000 mL | Freq: Once | INTRAVENOUS | Status: AC | PRN
  Administered 2017-06-21: 100 mL via INTRAVENOUS

## 2017-06-21 MED ORDER — POTASSIUM CHLORIDE IN NACL 20-0.9 MEQ/L-% IV SOLN
INTRAVENOUS | Status: DC
  Administered 2017-06-21 – 2017-06-22 (×2): via INTRAVENOUS
  Filled 2017-06-21 (×3): qty 1000

## 2017-06-21 MED ORDER — ACETAMINOPHEN 650 MG RE SUPP: 650.0000 mg | Freq: Four times a day (QID) | RECTAL | Status: DC | PRN

## 2017-06-21 MED ORDER — HYDROMORPHONE HCL 1 MG/ML IJ SOLN
1.0000 mg | INTRAMUSCULAR | Status: DC | PRN
  Administered 2017-06-21 – 2017-06-23 (×5): 1 mg via INTRAVENOUS
  Filled 2017-06-21 (×5): qty 1

## 2017-06-21 MED ORDER — FENTANYL CITRATE (PF) 100 MCG/2ML IJ SOLN
50.0000 ug | Freq: Once | INTRAMUSCULAR | Status: AC
  Administered 2017-06-21: 50 ug via INTRAVENOUS
  Filled 2017-06-21: qty 2

## 2017-06-21 MED ORDER — BISACODYL 10 MG RE SUPP
10.0000 mg | Freq: Every day | RECTAL | Status: DC | PRN
  Administered 2017-06-23: 10 mg via RECTAL
  Filled 2017-06-21: qty 1

## 2017-06-21 MED ORDER — PANTOPRAZOLE SODIUM 40 MG IV SOLR
40.0000 mg | Freq: Two times a day (BID) | INTRAVENOUS | Status: DC
  Administered 2017-06-21 – 2017-06-23 (×4): 40 mg via INTRAVENOUS
  Filled 2017-06-21 (×4): qty 40

## 2017-06-21 MED ORDER — IOPAMIDOL (ISOVUE-300) INJECTION 61%
30.0000 mL | Freq: Once | INTRAVENOUS | Status: AC
  Administered 2017-06-21: 30 mL via ORAL

## 2017-06-21 MED ORDER — MOMETASONE FURO-FORMOTEROL FUM 200-5 MCG/ACT IN AERO
2.0000 | INHALATION_SPRAY | Freq: Two times a day (BID) | RESPIRATORY_TRACT | Status: DC
  Filled 2017-06-21: qty 8.8

## 2017-06-21 MED ORDER — ACETAMINOPHEN 325 MG PO TABS
650.0000 mg | ORAL_TABLET | Freq: Four times a day (QID) | ORAL | Status: DC | PRN
  Administered 2017-06-22 – 2017-06-23 (×2): 650 mg via ORAL
  Filled 2017-06-21 (×2): qty 2

## 2017-06-21 NOTE — ED Provider Notes (Signed)
[facility] Emergency Department Provider Note  Time seen: 2:30 PM  I have reviewed the triage vital signs and the nursing notes.   HISTORY  Chief Complaint Abdominal Pain; Emesis; and Diarrhea    HPI Courtney Chandler is a 76 y.o. female with a past medical history of anxiety, arthritis, hypertension, presents to the emergency department for abdominal pain. According to the patient since yesterday she has been experiencing mostly upper abdominal pain with some radiation to her back. Denies any chest pain or trouble breathing. States she occasionally has pains that shoot down lower into her abdomen. Denies any dysuria, states she has been having diarrhea as well as nausea and vomiting. Denies any black or bloody stool or vomit. Denies any known fever. Describes her pain as significant 9/10 aching pain in her upper abdomen.Patient states a history of gastritis to which this feels similar however she has increased her Nexium, without relief which she states is atypical.  Past Medical History:  Diagnosis Date  . Allergy   . Anxiety    associated with dyphagia  . Arthritis    osteo of left hip  . Asthma    moderate persistant  . 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 Deviated septum    2002  . Hypertension   . 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 Post-polio syndrome   . Scarlet fever   . Sleep apnea    severe - CPAP  . Wears dentures    lower partial    Patient Active Problem List   Diagnosis Date Noted  . Acute esophagitis 10/09/2015  . Counseling regarding end of life decision making 10/03/2014  . DNR (do not resuscitate) 10/03/2014  . HYPERCHOLESTEROLEMIA 03/29/2008  . VAGINITIS, ATROPHIC 12/03/2007  . Osteopenia 12/03/2007  . 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  . Essential hypertension, benign 10/02/2007  .  EXTERNAL HEMORRHOIDS 10/02/2007  . ALLERGIC RHINITIS 10/02/2007  . Asthma, moderate persistent 10/02/2007  . HIATAL HERNIA 10/02/2007  . OSTEOARTHRITIS 10/02/2007  . SLEEP APNEA 10/02/2007    Past Surgical History:  Procedure Laterality Date  . APPENDECTOMY    . CATARACT EXTRACTION W/PHACO Left 02/07/2016   Procedure: CATARACT EXTRACTION PHACO AND INTRAOCULAR LENS PLACEMENT (IOC);  Surgeon: Lockie Mola, MD;  Location: [facility];  Service: Ophthalmology;  Laterality: Left;  CPAP  . CATARACT EXTRACTION W/PHACO Right 03/06/2016   Procedure: CATARACT EXTRACTION PHACO AND INTRAOCULAR LENS PLACEMENT (IOC);  Surgeon: Lockie Mola, MD;  Location: [facility];  Service: Ophthalmology;  Laterality: Right;  CPAP  . CHOLECYSTECTOMY    . COLONOSCOPY WITH ESOPHAGOGASTRODUODENOSCOPY (EGD)    . 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 NASAL SEPTUM SURGERY      Prior to Admission medications   Medication Sig Start Date End Date Taking? Authorizing Provider  acetaminophen (TYLENOL) 325 MG tablet Take 325 mg by mouth daily as needed.    [provider]  ADVAIR DISKUS 500-50 MCG/DOSE AEPB INHALE ONE DOSE BY MOUTH EVERY 12 HOURS 10/22/16   Bedsole, Amy E, MD  aspirin 325 MG EC tablet Take 650 mg by mouth at bedtime as needed for pain.    [provider]  Calcium Carbonate-Vitamin D (CALTRATE 600+D) 600-400 MG-UNIT per tablet Take 1 tablet by mouth daily.      [provider]  Dextromethorphan-Guaifenesin 20-400 MG TABS Take by mouth 4 (four) times daily as needed.      [provider]  Esomeprazole Magnesium (NEXIUM PO) Take 1 capsule by mouth daily.    [provider]  fish oil-omega-3 fatty acids 1000 MG capsule Take 1 g by mouth 2 (two) times daily.    [provider]  Glucosamine-Chondroitin-MSM 500-400-300 MG TABS Take 2 tablets by mouth daily.     [provider]  latanoprost (XALATAN) 0.005 % ophthalmic solution  Place 1 drop into the left eye at bedtime.  09/22/13   [provider]  Melatonin 10 MG TABS Take 1 tablet by mouth at bedtime.    [provider]  montelukast (SINGULAIR) 10 MG tablet TAKE ONE TABLET BY MOUTH AT BEDTIME 10/22/16   Bedsole, Amy E, MD  Multiple Vitamins-Minerals (MULTIVITAMIN WITH MINERALS) tablet Take 1 tablet by mouth daily.    [provider]  PROAIR HFA 108 (90 Base) MCG/ACT inhaler INHALE TWO PUFFS BY MOUTH 4 TIMES DAILY 01/17/17   Bedsole, Amy E, MD  senna (SENOKOT) 8.6 MG tablet Take 3 tablets by mouth daily.      [provider]    Allergies  Allergen Reactions  . Beta Adrenergic Blockers     Asthma exacerbation  . Codeine     REACTION: Nausea and vomiting  . Erythromycin     REACTION: Nausea and vomiting    Family History  Problem Relation Age of Onset  . Cancer Mother        lung  . Alcohol abuse Mother   . Diabetes Father   . Alcohol abuse Father   . Cancer Maternal Grandmother        colon  . Cancer Maternal Grandfather        colon  . Diabetes Paternal Grandmother     Social History Social History  Substance Use Topics  . Smoking status: Former Games developer  . Smokeless tobacco: Never Used     Comment: quit 1981  . Alcohol use Yes     Comment: very rare wine (2x/yr)    Review of Systems Constitutional: Negative for fever Cardiovascular: Negative for chest pain. Respiratory: Negative for shortness of breath. Gastrointestinal: Positive for upper abdominal pain, nausea vomiting and diarrhea Genitourinary: Negative for dysuria. Musculoskeletal: Some radiation of pain to the mid back Neurological: Negative for headache All other ROS negative  ____________________________________________   PHYSICAL EXAM:  VITAL SIGNS: ED Triage Vitals  Enc Vitals Group     BP 06/21/17 1258 (!) 142/111     Pulse Rate 06/21/17 1258 98     Resp 06/21/17 1258 18     Temp 06/21/17 1258 98.2 F (36.8 C)     Temp Source  06/21/17 1258 Oral     SpO2 06/21/17 1258 98 %     Weight 06/21/17 1259 126 lb (57.2 kg)     Height 06/21/17 1259 5' 0.5" (1.537 m)     Head Circumference --      Peak Flow --      Pain Score 06/21/17 1308 9     Pain Loc --      Pain Edu? --      Excl. in GC? --    Constitutional: Alert and oriented. Well appearing and in no distress. Eyes: Normal exam ENT   Head: Normocephalic and atraumatic.   Mouth/Throat: Mucous membranes are moist. Cardiovascular: Normal rate, regular rhythm. No murmur Respiratory: Normal respiratory effort without tachypnea nor retractions. Breath  sounds are clear  Gastrointestinal: Soft, moderate epigastric tenderness to palpation with mild early diffuse tenderness palpation. No rebound or guarding or distention. Musculoskeletal: Nontender with normal range of motion in all extremities.  Neurologic:  Normal speech and language. No gross focal neurologic deficits Skin:  Skin is warm, dry and intact.  Psychiatric: Mood and affect are normal.  ____________________________________________    EKG  EKG reviewed and interpreted by myself shows normal sinus rhythm at 98 bpm, narrow QRS, normal axis, normal intervals, nonspecific ST changes.  ____________________________________________    RADIOLOGY  ED pending  ____________________________________________   INITIAL IMPRESSION / ASSESSMENT AND PLAN / ED COURSE  Pertinent labs & imaging results that were available during my care of the patient were reviewed by me and considered in my medical decision making (see chart for details).  Patient presents to the emergency department for upper abdominal pain nausea vomiting and diarrhea. Patient has a moderate epigastric pain however she also has fairly diffuse mild tenderness throughout her abdomen. We will check labs and obtain a CT scan to further evaluate. Given the epigastric location of pain we will also obtain an EKG and troponin.  Patient's labs  have resulted showing an elevated lipase of 523 likely the cause of the patient's discomfort.  I discussed the patient with the hospitalist for admission to the hospital. Hospitalist would like to wait for CT to results. CT pending and signed out to oncoming physician.  ____________________________________________   FINAL CLINICAL IMPRESSION(S) / ED DIAGNOSES  Acute pancreatitis    Minna AntisPaduchowski, Almira Phet[place], MD 06/21/17 [phone]

## 2017-06-21 NOTE — H&P (Signed)
History and Physical    Courtney Chandler:[id number] DOB: 03-Apr-1941 DOA: 06/21/2017  Referring physician: Dr. Lenard Lance PCP: Excell Seltzer, MD  Specialists: none  Chief Complaint: abdominal pain with N/V/D  HPI: Courtney Chandler is a 76 y.o. female has a past medical history significant for OA, asthma, and HTN now with 1-2 day hx of worsening abdominal pain associated with N/V/D. In the ER, pt continues to have pain despite IV meds. Pain is epigastric but radiates to lower abdomen. Some improvement after BM. Lipase elevated in ER c/w pancreatitis. She is now admitted. Denies fever. No CP or SOB. Had GB removed several years ago. Denies ETOH intake or issue with lipids  Review of Systems: The patient denies anorexia, fever, weight loss,, vision loss, decreased hearing, hoarseness, chest pain, syncope, dyspnea on exertion, peripheral edema, balance deficits, hemoptysis, melena, hematochezia, severe indigestion/heartburn, hematuria, incontinence, genital sores, muscle weakness, suspicious skin lesions, transient blindness, difficulty walking, depression, unusual weight change, abnormal bleeding, enlarged lymph nodes, angioedema, and breast masses.   Past Medical History:  Diagnosis Date  . Allergy   . Anxiety    associated with dyphagia  . Arthritis    osteo of left hip  . Asthma    moderate persistant  . 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 Deviated septum    2002  . Hypertension   . 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 Post-polio syndrome   . Scarlet fever   . Sleep apnea    severe - CPAP  . Wears dentures    lower partial   Past Surgical History:  Procedure Laterality Date  . APPENDECTOMY    . CATARACT EXTRACTION W/PHACO Left 02/07/2016   Procedure: CATARACT EXTRACTION PHACO AND INTRAOCULAR LENS PLACEMENT (IOC);  Surgeon: Lockie Mola, MD;  Location: [facility];  Service: Ophthalmology;  Laterality: Left;  CPAP  . CATARACT EXTRACTION W/PHACO Right 03/06/2016   Procedure: CATARACT EXTRACTION PHACO AND INTRAOCULAR LENS PLACEMENT (IOC);  Surgeon: Lockie Mola, MD;  Location: [facility];  Service: Ophthalmology;  Laterality: Right;  CPAP  . CHOLECYSTECTOMY    . COLONOSCOPY WITH ESOPHAGOGASTRODUODENOSCOPY (EGD)    . 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 NASAL SEPTUM SURGERY     Social History:  reports that she has quit smoking. She has never used smokeless tobacco. She reports that she drinks alcohol. She reports that she does not use drugs.  Allergies  Allergen Reactions  . Beta Adrenergic Blockers     Asthma exacerbation  . Codeine     REACTION: Nausea and vomiting  . Erythromycin     REACTION: Nausea and vomiting    Family History  Problem Relation Age of Onset  . Cancer Mother        lung  . Alcohol abuse Mother   . Diabetes Father   . Alcohol abuse Father   . Cancer Maternal Grandmother        colon  . Cancer Maternal Grandfather        colon  . Diabetes Paternal Grandmother     Prior to Admission medications   Medication Sig Start Date End Date Taking? Authorizing Provider  acetaminophen (TYLENOL) 325 MG tablet Take 325 mg by mouth daily as needed.   Yes [provider]  ADVAIR DISKUS 500-50  MCG/DOSE AEPB INHALE ONE DOSE BY MOUTH EVERY 12 HOURS 10/22/16  Yes Bedsole, Amy E, MD  aspirin 325 MG EC tablet Take 325 mg by mouth at bedtime as needed for pain.    Yes [provider]  Calcium Carbonate-Vitamin D (CALTRATE 600+D) 600-400 MG-UNIT per tablet Take 1 tablet by mouth daily.     Yes [provider]  cetirizine (ZYRTEC) 10 MG tablet Take 10 mg by mouth daily.   Yes [provider]  Dextromethorphan-Guaifenesin 20-400 MG TABS Take by mouth 4 (four) times daily as needed.     Yes [provider]  Esomeprazole Magnesium (NEXIUM  PO) Take 1 capsule by mouth daily.   Yes [provider]  fish oil-omega-3 fatty acids 1000 MG capsule Take 1 g by mouth 2 (two) times daily.   Yes [provider]  Glucosamine-Chondroitin-MSM 500-400-300 MG TABS Take 1 tablet by mouth 2 (two) times daily.    Yes [provider]  latanoprost (XALATAN) 0.005 % ophthalmic solution Place 1 drop into the left eye at bedtime.  09/22/13  Yes [provider]  Melatonin 5 MG TABS Take 20-40 mg by mouth at bedtime.    Yes [provider]  montelukast (SINGULAIR) 10 MG tablet TAKE ONE TABLET BY MOUTH AT BEDTIME 10/22/16  Yes Bedsole, Amy E, MD  Multiple Vitamins-Minerals (MULTIVITAMIN WITH MINERALS) tablet Take 1 tablet by mouth daily.   Yes [provider]  PROAIR HFA 108 (90 Base) MCG/ACT inhaler INHALE TWO PUFFS BY MOUTH 4 TIMES DAILY 01/17/17  Yes Bedsole, Amy E, MD  senna (SENOKOT) 8.6 MG tablet Take 3 tablets by mouth 2 (two) times daily.    Yes [provider]   Physical Exam: Vitals:   06/21/17 1258 06/21/17 1259 06/21/17 1500 06/21/17 1501  BP: (!) 142/111  (!) 152/70   Pulse: 98   90  Resp: 18     Temp: 98.2 F (36.8 C)     TempSrc: Oral     SpO2: 98%   97%  Weight:  57.2 kg (126 lb)    Height:  5' 0.5" (1.537 m)       General:  No apparent distress, WDWN, [place]/AT  Eyes: PERRL, EOMI, no scleral icterus, conjunctiva clear  ENT: moist oropharynx without exudate, Tm's benign, dentition good  Neck: supple, no lymphadenopathy. No bruits or thyromegaly  Cardiovascular: regular rate without MRG; 2+ peripheral pulses, no JVD, no peripheral edema  Respiratory: CTA biL, good air movement without wheezing, rhonchi or crackled. Respiratory effort normal  Abdomen: soft, tender to palpation diffusely, positive bowel sounds, no guarding, no rebound  Skin: no rashes or lesions  Musculoskeletal: normal bulk and tone, no joint swelling  Psychiatric: normal mood and affect,  A&OX3  Neurologic: CN 2-12 grossly intact, Motor strength 5/5 in all 4 groups with symmetric DTR's and non-focal sensory exam  Labs on Admission:  Basic Metabolic Panel:  Recent Labs Lab 06/21/17 1307  NA 135  K 3.5  CL 100*  CO2 24  GLUCOSE 123*  BUN 11  CREATININE 0.81  CALCIUM 9.4   Liver Function Tests:  Recent Labs Lab 06/21/17 1307  AST 29  ALT 19  ALKPHOS 65  BILITOT 1.2  PROT 8.2*  ALBUMIN 4.1    Recent Labs Lab 06/21/17 1307  LIPASE 523*   No results for input(s): AMMONIA in the last 168 hours. CBC:  Recent Labs Lab 06/21/17 1307  WBC 15.3*  HGB 16.0  HCT 47.1*  MCV  95.3  PLT 307   Cardiac Enzymes:  Recent Labs Lab 06/21/17 1307  TROPONINI <0.03    BNP (last 3 results) No results for input(s): BNP in the last 8760 hours.  ProBNP (last 3 results) No results for input(s): PROBNP in the last 8760 hours.  CBG: No results for input(s): GLUCAP in the last 168 hours.  Radiological Exams on Admission: No results found.  EKG: Independently reviewed.  Assessment/Plan Principal Problem:   Acute pancreatitis Active Problems:   POST-POLIO SYNDROME   Diffuse abdominal pain   Intractable nausea and vomiting   Will admit to floor with IV fluids and prn IV pain meds. NPO. CT abd/pelvis ordered. Consult GI. Repeat labs in AM.  Diet: NPO Fluids: NS with K+@100  DVT Prophylaxis: SQ Heparin  Code Status: DNR  Family Communication: yes  Disposition Plan: home  Time spent: 55 min

## 2017-06-21 NOTE — ED Triage Notes (Signed)
Pt presents to ED via POV. Pt states hx of gastritis and esophagitis. Pt states was taking red yeast rice for high cholesterol and stopped taking it yesterday due to thinking her body was rejecting it. Pt states she ate tiramisu yesterday, began having vomiting, diaphoresis, abdominal pain, diarrhea. Pt reports generalized abdominal pain today. Denies vomiting today. Pt states taking double OTC Nexium dose in attempt to reduce pain. Today pt has had no vomiting or diarrhea, but continues to have generalized abdominal pain, denies any blood in vomit or diarrhea.

## 2017-06-22 LAB — CBC
HCT: 42.5 % (ref 35.0–47.0)
Hemoglobin: 14.5 g/dL (ref 12.0–16.0)
MCH: 32.8 pg (ref 26.0–34.0)
MCHC: 34.2 g/dL (ref 32.0–36.0)
MCV: 95.9 fL (ref 80.0–100.0)
Platelets: 266 10*3/uL (ref 150–440)
RBC: 4.43 MIL/uL (ref 3.80–5.20)
RDW: 13.1 % (ref 11.5–14.5)
WBC: 15.6 10*3/uL — ABNORMAL HIGH (ref 3.6–11.0)

## 2017-06-22 LAB — COMPREHENSIVE METABOLIC PANEL
ALT: 16 U/L (ref 14–54)
ANION GAP: 9 (ref 5–15)
AST: 20 U/L (ref 15–41)
Albumin: 3.2 g/dL — ABNORMAL LOW (ref 3.5–5.0)
Alkaline Phosphatase: 61 U/L (ref 38–126)
BUN: 11 mg/dL (ref 6–20)
CHLORIDE: 106 mmol/L (ref 101–111)
CO2: 20 mmol/L — AB (ref 22–32)
Calcium: 8.3 mg/dL — ABNORMAL LOW (ref 8.9–10.3)
Creatinine, Ser: 0.57 mg/dL (ref 0.44–1.00)
Glucose, Bld: 87 mg/dL (ref 65–99)
POTASSIUM: 3.5 mmol/L (ref 3.5–5.1)
SODIUM: 135 mmol/L (ref 135–145)
Total Bilirubin: 1.2 mg/dL (ref 0.3–1.2)
Total Protein: 6.5 g/dL (ref 6.5–8.1)

## 2017-06-22 LAB — LIPASE, BLOOD: Lipase: 132 U/L — ABNORMAL HIGH (ref 11–51)

## 2017-06-22 LAB — GLUCOSE, CAPILLARY: Glucose-Capillary: 75 mg/dL (ref 65–99)

## 2017-06-22 MED ORDER — FLUTICASONE-SALMETEROL 500-50 MCG/DOSE IN AEPB
1.0000 | INHALATION_SPRAY | Freq: Two times a day (BID) | RESPIRATORY_TRACT | Status: DC
  Administered 2017-06-22 – 2017-06-23 (×2): 1 via RESPIRATORY_TRACT
  Filled 2017-06-22: qty 60

## 2017-06-22 MED ORDER — OXYCODONE-ACETAMINOPHEN 5-325 MG PO TABS
1.0000 | ORAL_TABLET | Freq: Three times a day (TID) | ORAL | Status: DC | PRN
  Administered 2017-06-23: 1 via ORAL
  Filled 2017-06-22: qty 1

## 2017-06-22 MED ORDER — SODIUM CHLORIDE 0.9 % IV SOLN
INTRAVENOUS | Status: DC
  Administered 2017-06-22 – 2017-06-23 (×3): via INTRAVENOUS

## 2017-06-22 MED ORDER — PROMETHAZINE HCL 25 MG/ML IJ SOLN
12.5000 mg | Freq: Four times a day (QID) | INTRAMUSCULAR | Status: DC | PRN
  Administered 2017-06-22: 12.5 mg via INTRAVENOUS
  Filled 2017-06-22: qty 1

## 2017-06-22 MED ORDER — OXYCODONE-ACETAMINOPHEN 5-325 MG PO TABS: 1.0000 | ORAL_TABLET | Freq: Three times a day (TID) | ORAL | 0 refills | Status: DC | PRN

## 2017-06-22 NOTE — Discharge Summary (Signed)
[facility] -  at [facility]   PATIENT NAME: Courtney Chandler    MR#:  [id number]  DATE OF BIRTH:  Mar 12, 1941  DATE OF ADMISSION:  06/21/2017   ADMITTING PHYSICIAN: Marguarite Arbour, MD  DATE OF DISCHARGE: 06/22/2017 PRIMARY CARE PHYSICIAN: Excell Seltzer, MD   ADMISSION DIAGNOSIS:  Idiopathic acute pancreatitis, unspecified complication status [K85.00] DISCHARGE DIAGNOSIS:  Principal Problem:   Acute pancreatitis Active Problems:   POST-POLIO SYNDROME   Diffuse abdominal pain   Intractable nausea and vomiting  SECONDARY DIAGNOSIS:   Past Medical History:  Diagnosis Date  . Allergy   . Anxiety    associated with dyphagia  . Arthritis    osteo of left hip  . Asthma    moderate persistant  . 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 Deviated septum    2002  . Hypertension   . 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 Post-polio syndrome   . Scarlet fever   . Sleep apnea    severe - CPAP  . Wears dentures    lower partial   HOSPITAL COURSE:  Acute pancreatitis The patient has no abdominal pain, nausea or vomiting. She has been treated with IV fluids and prn IV pain meds. CT abd/pelvis showed pancreatitis. Lipase decreased to 132.  Follow-up GI Consult. The patient may be discharged to home if physician agrees.  Hypertension. Controlled. History of Asthma. Stable. DISCHARGE CONDITIONS:  Stable, discharge to home today. CONSULTS OBTAINED:  Treatment Team:  Tressia Danas, MD DRUG ALLERGIES:   Allergies  Allergen Reactions  . Beta Adrenergic Blockers     Asthma exacerbation  . Codeine     REACTION: Nausea and vomiting  . Erythromycin     REACTION: Nausea and vomiting   DISCHARGE MEDICATIONS:   Allergies as of 06/22/2017      Reactions   Beta Adrenergic Blockers    Asthma exacerbation   Codeine    REACTION: Nausea and vomiting   Erythromycin    REACTION: Nausea and vomiting      Medication List    TAKE these medications   acetaminophen 325 MG tablet Commonly known as:  TYLENOL Take 325 mg by mouth daily as needed.   ADVAIR DISKUS 500-50 MCG/DOSE Aepb Generic drug:  Fluticasone-Salmeterol INHALE ONE DOSE BY MOUTH EVERY 12 HOURS   aspirin 325 MG EC tablet Take 325 mg by mouth at bedtime as needed for pain.   CALTRATE 600+D 600-400 MG-UNIT tablet Generic drug:  Calcium Carbonate-Vitamin D Take 1 tablet by mouth daily.   cetirizine 10 MG tablet Commonly known as:  ZYRTEC Take 10 mg by mouth daily.   Dextromethorphan-Guaifenesin 20-400 MG Tabs Take by mouth 4 (four) times daily as needed.   fish oil-omega-3 fatty acids 1000 MG capsule Take 1 g by mouth 2 (two) times daily.   Glucosamine-Chondroitin-MSM 500-400-300 MG Tabs Take 1 tablet by mouth 2 (two) times daily.   latanoprost 0.005 % ophthalmic solution Commonly known as:  XALATAN Place 1 drop into the left eye at bedtime.   Melatonin 5 MG Tabs Take 20-40 mg by mouth at bedtime.   montelukast 10 MG tablet Commonly known as:  SINGULAIR TAKE ONE TABLET BY MOUTH AT BEDTIME   multivitamin with minerals tablet Take 1 tablet by mouth daily.   NEXIUM PO Take  1 capsule by mouth daily.   oxyCODONE-acetaminophen 5-325 MG tablet Commonly known as:  PERCOCET/ROXICET Take 1 tablet by mouth every 8 (eight) hours as needed for moderate pain.   PROAIR HFA 108 (90 Base) MCG/ACT inhaler Generic drug:  albuterol INHALE TWO PUFFS BY MOUTH 4 TIMES DAILY   senna 8.6 MG tablet Commonly known as:  SENOKOT Take 3 tablets by mouth 2 (two) times daily.        DISCHARGE INSTRUCTIONS:  See AVS.  If you experience worsening of your admission symptoms, develop shortness of breath, life threatening emergency, suicidal or homicidal thoughts you must seek medical attention immediately by calling 911 or calling  your MD immediately  if symptoms less severe.  You Must read complete instructions/literature along with all the possible adverse reactions/side effects for all the Medicines you take and that have been prescribed to you. Take any new Medicines after you have completely understood and accpet all the possible adverse reactions/side effects.   Please note  You were cared for by a hospitalist during your hospital stay. If you have any questions about your discharge medications or the care you received while you were in the hospital after you are discharged, you can call the unit and asked to speak with the hospitalist on call if the hospitalist that took care of you is not available. Once you are discharged, your primary care physician will handle any further medical issues. Please note that NO REFILLS for any discharge medications will be authorized once you are discharged, as it is imperative that you return to your primary care physician (or establish a relationship with a primary care physician if you do not have one) for your aftercare needs so that they can reassess your need for medications and monitor your lab values.    On the day of Discharge:  VITAL SIGNS:  Blood pressure (!) 111/49, pulse 86, temperature 98 F (36.7 C), temperature source Oral, resp. rate 18, height 5' (1.524 m), weight 130 lb 14.4 oz (59.4 kg), SpO2 94 %. PHYSICAL EXAMINATION:  GENERAL:  76 y.o.-year-old patient lying in the bed with no acute distress.  EYES: Pupils equal, round, reactive to light and accommodation. No scleral icterus. Extraocular muscles intact.  HEENT: Head atraumatic, normocephalic. Oropharynx and nasopharynx clear.  NECK:  Supple, no jugular venous distention. No thyroid enlargement, no tenderness.  LUNGS: Normal breath sounds bilaterally, no wheezing, rales,rhonchi or crepitation. No use of accessory muscles of respiration.  CARDIOVASCULAR: S1, S2 normal. No murmurs, rubs, or gallops.  ABDOMEN:  Soft, non-tender, non-distended. Bowel sounds present. No organomegaly or mass.  EXTREMITIES: No pedal edema, cyanosis, or clubbing.  NEUROLOGIC: Cranial nerves II through XII are intact. Muscle strength 5/5 in all extremities. Sensation intact. Gait not checked.  PSYCHIATRIC: The patient is alert and oriented x 3.  SKIN: No obvious rash, lesion, or ulcer.  DATA REVIEW:   CBC  Recent Labs Lab 06/22/17 0356  WBC 15.6*  HGB 14.5  HCT 42.5  PLT 266    Chemistries   Recent Labs Lab 06/22/17 0356  NA 135  K 3.5  CL 106  CO2 20*  GLUCOSE 87  BUN 11  CREATININE 0.57  CALCIUM 8.3*  AST 20  ALT 16  ALKPHOS 61  BILITOT 1.2     Microbiology Results  Results for orders placed or performed in visit on 01/18/15  Fecal occult blood, imunochemical     Status: Abnormal   Collection Time: 01/18/15  3:10 PM  Result  Value Ref Range Status   Fecal Occult Bld Positive (A) Negative Final    RADIOLOGY:  Ct Abdomen Pelvis W Contrast  Result Date: 06/21/2017 CLINICAL DATA:  With onset of vomiting, diaphoresis, abdominal pain and diarrhea yesterday after eating tiramisu, generalized abdominal pain today, history hypertension, asthma, former smoker EXAM: CT ABDOMEN AND PELVIS WITH CONTRAST TECHNIQUE: Multidetector CT imaging of the abdomen and pelvis was performed using the standard protocol following bolus administration of intravenous contrast. Sagittal and coronal MPR images reconstructed from axial data set. CONTRAST:  100mL ISOVUE-300 IOPAMIDOL (ISOVUE-300) INJECTION 61% IV. Dilute oral contrast. COMPARISON:  None FINDINGS: Lower chest: Minimal atelectasis at lung bases Hepatobiliary: Gallbladder surgically absent. Extrahepatic biliary dilatation, CBD 11 mm. Nonspecific 6 mm intermediate attenuation nodule RIGHT lobe liver image 18. No significant intrahepatic biliary dilatation or additional hepatic mass lesion. Pancreas: Peripancreatic edema particularly at the head and uncinate process  extending to the body. Edema extends into the root of the small bowel mesenteric, para-aortic, and adjacent to the duodenum. Findings favor focal pancreatitis. No discrete pancreatic mass or pseudocyst. Spleen: Normal appearance Adrenals/Urinary Tract: Adrenal glands, kidneys, ureters, and bladder normal appearance. Edema from the peripancreatic and very duodenal region extends to the RIGHT kidney but this does not appear to be the epicenter of the process. Stomach/Bowel: Minimal thickening of the duodenum felt to be secondary to pancreatitis rather than the primary cause of the peripancreatic and. Duodenal edema. Stomach appears mildly distended with a small moderate-sized hiatal hernia. Surgical absence of appendix. Scattered colonic diverticulosis greatest at sigmoid colon. Remaining bowel loops unremarkable. Vascular/Lymphatic: Atherosclerotic calcifications aorta and iliac arteries as well as coronary arteries. Aorta normal caliber. Portal vein, SMV and splenic vein appear patent. Reproductive: Unremarkable uterus and adnexa Other: No free air or free fluid.  No hernia. Musculoskeletal: Bones demineralized. IMPRESSION: Peripancreatic edema particularly at the pancreatic head and uncinate process favoring acute pancreatitis ; correlation with serum lipase recommended. Peripancreatic edema extends periduodenal region, into root of small bowel mesentery, para-aortic, and to the RIGHT along Gerota's fascia and into the RIGHT perinephric space. No discrete pancreatic mass or pseudocyst identified. Extrahepatic biliary dilatation, CBD 11 mm, recommend correlation with LFTs to exclude distal CBD obstruction. Hiatal hernia. Electronically Signed   By: Ulyses SouthwardMark  Boles M.D.   On: 06/21/2017 16:41     Management plans discussed with the patient, family and they are in agreement.  CODE STATUS: DNR   TOTAL TIME TAKING CARE OF THIS PATIENT: 32 minutes.    Shaune Pollackhen, Kolson Chovanec M.D on 06/22/2017 at 1:42 PM  Between 7am to 6pm -  Pager - [phone]  After 6pm go to [email or web address] - Social research officer, governmentpassword [email or web address]  [facility] [place] [facility]  Office  [phone]  CC: Primary care physician; Excell SeltzerBedsole, Amy E, MD   Note: This dictation was prepared with Dragon dictation along with smaller phrase technology. Any transcriptional errors that result from this process are unintentional.

## 2017-06-22 NOTE — Progress Notes (Signed)
[facility] - [place] at [facility]   PATIENT NAME: Carlisia Chandler    MR#:  [id number]  DATE OF BIRTH:  Aug 23, 1941  SUBJECTIVE:  CHIEF COMPLAINT:   Chief Complaint  Patient presents with  . Abdominal Pain  . Emesis  . Diarrhea   Abdominal sore, no nausea vomiting or diarrhea. REVIEW OF SYSTEMS:  Review of Systems  Constitutional: Negative for chills, diaphoresis, fever, malaise/fatigue and weight loss.  HENT: Negative for congestion and nosebleeds.   Eyes: Negative for blurred vision and double vision.  Respiratory: Negative for cough, hemoptysis, sputum production, shortness of breath and wheezing.   Cardiovascular: Negative for chest pain, palpitations, orthopnea and leg swelling.  Gastrointestinal: Positive for abdominal pain. Negative for blood in stool, constipation, diarrhea, melena, nausea and vomiting.  Genitourinary: Negative for dysuria, flank pain, frequency, hematuria and urgency.  Musculoskeletal: Negative for joint pain and myalgias.  Skin: Negative for itching and rash.  Neurological: Negative for dizziness, tremors, sensory change, speech change, focal weakness, seizures, loss of consciousness, weakness and headaches.  Endo/Heme/Allergies: Negative for polydipsia. Does not bruise/bleed easily.  Psychiatric/Behavioral: Negative for depression. The patient is not nervous/anxious.     DRUG ALLERGIES:   Allergies  Allergen Reactions  . Beta Adrenergic Blockers     Asthma exacerbation  . Codeine     REACTION: Nausea and vomiting  . Erythromycin     REACTION: Nausea and vomiting   VITALS:  Blood pressure (!) 111/49, pulse 86, temperature 98 F (36.7 C), temperature source Oral, resp. rate 18, height 5' (1.524 m), weight 130 lb 14.4 oz (59.4 kg), SpO2 94 %. PHYSICAL EXAMINATION:  Physical Exam  Constitutional: She is oriented to person, place, and time and well-developed, well-nourished, and in no distress.  HENT:  Head: Normocephalic.    Mouth/Throat: Oropharynx is clear and moist.  Eyes: Pupils are equal, round, and reactive to light. Conjunctivae and EOM are normal. No scleral icterus.  Neck: Normal range of motion. Neck supple. No JVD present. No tracheal deviation present.  Cardiovascular: Normal rate, regular rhythm and normal heart sounds.  Exam reveals no gallop.   No murmur heard. Pulmonary/Chest: Effort normal and breath sounds normal. No respiratory distress. She has no wheezes. She has no rales.  Abdominal: Soft. Bowel sounds are normal. She exhibits no distension. There is no tenderness. There is no rebound.  Musculoskeletal: Normal range of motion. She exhibits no edema or tenderness.  Neurological: She is alert and oriented to person, place, and time. No cranial nerve deficit.  Skin: No rash noted. No erythema.  Psychiatric: Affect normal.   LABORATORY PANEL:  Female CBC  Recent Labs Lab 06/22/17 0356  WBC 15.6*  HGB 14.5  HCT 42.5  PLT 266   ------------------------------------------------------------------------------------------------------------------ Chemistries   Recent Labs Lab 06/22/17 0356  NA 135  K 3.5  CL 106  CO2 20*  GLUCOSE 87  BUN 11  CREATININE 0.57  CALCIUM 8.3*  AST 20  ALT 16  ALKPHOS 61  BILITOT 1.2   RADIOLOGY:  Ct Abdomen Pelvis W Contrast  Result Date: 06/21/2017 CLINICAL DATA:  With onset of vomiting, diaphoresis, abdominal pain and diarrhea yesterday after eating tiramisu, generalized abdominal pain today, history hypertension, asthma, former smoker EXAM: CT ABDOMEN AND PELVIS WITH CONTRAST TECHNIQUE: Multidetector CT imaging of the abdomen and pelvis was performed using the standard protocol following bolus administration of intravenous contrast. Sagittal and coronal MPR images reconstructed from axial data set. CONTRAST:  ISOVUE-300 IOPAMIDOL (ISOVUE-300) INJECTION  61% IV. Dilute oral contrast. COMPARISON:  None FINDINGS: Lower chest: Minimal atelectasis at  lung bases Hepatobiliary: Gallbladder surgically absent. Extrahepatic biliary dilatation, CBD 11 mm. Nonspecific 6 mm intermediate attenuation nodule RIGHT lobe liver image 18. No significant intrahepatic biliary dilatation or additional hepatic mass lesion. Pancreas: Peripancreatic edema particularly at the head and uncinate process extending to the body. Edema extends into the root of the small bowel mesenteric, para-aortic, and adjacent to the duodenum. Findings favor focal pancreatitis. No discrete pancreatic mass or pseudocyst. Spleen: Normal appearance Adrenals/Urinary Tract: Adrenal glands, kidneys, ureters, and bladder normal appearance. Edema from the peripancreatic and very duodenal region extends to the RIGHT kidney but this does not appear to be the epicenter of the process. Stomach/Bowel: Minimal thickening of the duodenum felt to be secondary to pancreatitis rather than the primary cause of the peripancreatic and. Duodenal edema. Stomach appears mildly distended with a small moderate-sized hiatal hernia. Surgical absence of appendix. Scattered colonic diverticulosis greatest at sigmoid colon. Remaining bowel loops unremarkable. Vascular/Lymphatic: Atherosclerotic calcifications aorta and iliac arteries as well as coronary arteries. Aorta normal caliber. Portal vein, SMV and splenic vein appear patent. Reproductive: Unremarkable uterus and adnexa Other: No free air or free fluid.  No hernia. Musculoskeletal: Bones demineralized. IMPRESSION: Peripancreatic edema particularly at the pancreatic head and uncinate process favoring acute pancreatitis ; correlation with serum lipase recommended. Peripancreatic edema extends periduodenal region, into root of small bowel mesentery, para-aortic, and to the RIGHT along Gerota's fascia and into the RIGHT perinephric space. No discrete pancreatic mass or pseudocyst identified. Extrahepatic biliary dilatation, CBD 11 mm, recommend correlation with LFTs to exclude  distal CBD obstruction. Hiatal hernia. Electronically Signed   By: Ulyses SouthwardMark  Boles M.D.   On: 06/21/2017 16:41   ASSESSMENT AND PLAN:   Acute pancreatitis The patient has no abdominal pain, nausea or vomiting. She has been treated with IV fluids and prn IV pain meds. CT abd/pelvis showed pancreatitis. Lipase decreased to 132.  Per GI Consult, continue clear liquid diet, supportive care and possible discharge tomorrow.  Hypertension. Controlled. History of Asthma. Stable.  All the records are reviewed and case discussed with Care Management/Social Worker. Management plans discussed with the patient, family and they are in agreement.  CODE STATUS: DNR  TOTAL TIME TAKING CARE OF THIS PATIENT: 33 minutes.   More than 50% of the time was spent in counseling/coordination of care: YES  POSSIBLE D/C IN 1DAYS, DEPENDING ON CLINICAL CONDITION.   Shaune Pollackhen, Azlee Monforte M.D on 06/22/2017 at 2:05 PM  Between 7am to 6pm - Pager - [phone]  After 6pm go to [email or web address] - Social research officer, governmentpassword [email or web address]  [facility] [place] [facility]  Office  [phone]  CC: Primary care physician; Excell SeltzerBedsole, Amy E, MD  Note: This dictation was prepared with Dragon dictation along with smaller phrase technology. Any transcriptional errors that result from this process are unintentional.

## 2017-06-22 NOTE — Consult Note (Signed)
Reason for Consult:Pancreatitis Referring Physician: Dr. Vira Agar is an 76 y.o. female.  HPI: Seen in consultation at the request of Dr. Bridgett Larsson. The history is obtained through the patient, a retired Therapist, sports, and review of EPIC. She has OA, asthma, HTN, post-polio syndrome, and OSA on CPAP.   She was admitted through the emergency department with a 2 day history of acute onset of nausea, vomiting, abdominal pain, and diarrhea. There has been associated bloating such that her clothes don't fit. The abdominal pain is located diffusely in the lower abdomen and more focally between the xiphoid process and her umbilicus.   CT scan showed pancreatitis and her lipase was elevated. She was given IV fluids and placed on bowel rest.   She thought she was improving as she was tolerating ginger ale. When she tried to eat a salad at lunch, she developed recurrent nausea and abdominal pain.   She has no prior history of pancreatitis. She is a retired Agricultural consultant. She drinks three glasses of wine a year, if that. She is a strict vegetarian. She denies any street drugs. She does not a scorpion. She uses multiple supplements including glucosamine and senna. Over the last year she has also used cinamon and fish oil.  She reports having an EGD and colonoscopy at the [facility] 02/2015 for hemoccult positive stools and dysphagia. The colonoscopy showed hemorrhoids and diverticulosis. She remembers the EGD showing gastritis and esophagitis and feels that her gastritis acts up from time to time.  Reports also show an osteophyte.    There is no family history of pancreatitis or sarcoid. She has no personal history of diabetes although multiple family members including her parents and her son have diabetes.   Past Medical History:  Diagnosis Date  . Allergy   . Anxiety    associated with dyphagia  . Arthritis    osteo of left hip  . Asthma    moderate persistant  .  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 Deviated septum    2002  . Hypertension   . 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 Post-polio syndrome   . Scarlet fever   . Sleep apnea    severe - CPAP  . Wears dentures    lower partial    Past Surgical History:  Procedure Laterality Date  . APPENDECTOMY    . CATARACT EXTRACTION W/PHACO Left 02/07/2016   Procedure: CATARACT EXTRACTION PHACO AND INTRAOCULAR LENS PLACEMENT (IOC);  Surgeon: Leandrew Koyanagi, MD;  Location: [place];  Service: Ophthalmology;  Laterality: Left;  CPAP  . CATARACT EXTRACTION W/PHACO Right 03/06/2016   Procedure: CATARACT EXTRACTION PHACO AND INTRAOCULAR LENS PLACEMENT (IOC);  Surgeon: Leandrew Koyanagi, MD;  Location: [place];  Service: Ophthalmology;  Laterality: Right;  CPAP  . CHOLECYSTECTOMY    . COLONOSCOPY WITH ESOPHAGOGASTRODUODENOSCOPY (EGD)    . [place]   club foot  . NASAL SEPTUM SURGERY      Family History  Problem Relation Age of Onset  . Cancer Mother        lung  . Alcohol abuse Mother   . Diabetes Father   . Alcohol abuse Father   . Cancer Maternal Grandmother        colon  . Cancer Maternal  Grandfather        colon  . Diabetes Paternal Grandmother     Social History:  reports that she has quit smoking. She has never used smokeless tobacco. She reports that she drinks alcohol. She reports that she does not use drugs.  Allergies:  Allergies  Allergen Reactions  . Beta Adrenergic Blockers     Asthma exacerbation  . Codeine     REACTION: Nausea and vomiting  . Erythromycin     REACTION: Nausea and vomiting    Medications:  I have reviewed the patient's current medications. Prior to Admission:  Prescriptions Prior to Admission  Medication Sig Dispense Refill Last Dose  . acetaminophen (TYLENOL) 325 MG  tablet Take 325 mg by mouth daily as needed.   PRN at PRN  . ADVAIR DISKUS 500-50 MCG/DOSE AEPB INHALE ONE DOSE BY MOUTH EVERY 12 HOURS 60 each 11 06/21/2017 at 0800  . aspirin 325 MG EC tablet Take 325 mg by mouth at bedtime as needed for pain.    PRN at PRN  . Calcium Carbonate-Vitamin D (CALTRATE 600+D) 600-400 MG-UNIT per tablet Take 1 tablet by mouth daily.     06/20/2017 at 0800  . cetirizine (ZYRTEC) 10 MG tablet Take 10 mg by mouth daily.   06/20/2017 at 2000  . Dextromethorphan-Guaifenesin 20-400 MG TABS Take by mouth 4 (four) times daily as needed.     PRN at PRN  . Esomeprazole Magnesium (NEXIUM PO) Take 1 capsule by mouth daily.   06/20/2017 at 0800  . fish oil-omega-3 fatty acids 1000 MG capsule Take 1 g by mouth 2 (two) times daily.   Past Month at Unknown time  . Glucosamine-Chondroitin-MSM 500-400-300 MG TABS Take 1 tablet by mouth 2 (two) times daily.    06/20/2017 at 2000  . latanoprost (XALATAN) 0.005 % ophthalmic solution Place 1 drop into the left eye at bedtime.    06/20/2017 at 2000  . Melatonin 5 MG TABS Take 20-40 mg by mouth at bedtime.    06/20/2017 at 2000  . montelukast (SINGULAIR) 10 MG tablet TAKE ONE TABLET BY MOUTH AT BEDTIME 30 tablet 11 06/20/2017 at 2000  . Multiple Vitamins-Minerals (MULTIVITAMIN WITH MINERALS) tablet Take 1 tablet by mouth daily.   06/20/2017 at 0800  . PROAIR HFA 108 (90 Base) MCG/ACT inhaler INHALE TWO PUFFS BY MOUTH 4 TIMES DAILY 3 Inhaler 3 PRN at PRN  . senna (SENOKOT) 8.6 MG tablet Take 3 tablets by mouth 2 (two) times daily.    06/20/2017 at 0800   Scheduled: . heparin  5,000 Units Subcutaneous Q8H  . latanoprost  1 drop Left Eye QHS  . mometasone-formoterol  2 puff Inhalation BID  . pantoprazole (PROTONIX) IV  40 mg Intravenous Q12H   Continuous: . sodium chloride 150 mL/hr at 06/22/17 1400   NIH:[id number] **OR** acetaminophen, albuterol, bisacodyl, HYDROmorphone (DILAUDID) injection, ondansetron **OR** ondansetron (ZOFRAN) IV,  oxyCODONE-acetaminophen, promethazine  Results for orders placed or performed during the hospital encounter of 06/21/17 (from the past 48 hour(s))  Lipase, blood     Status: Abnormal   Collection Time: 06/21/17  1:07 PM  Result Value Ref Range   Lipase 523 (H) 11 - 51 U/L    Comment: RESULT CONFIRMED BY MANUAL DILUTION. TCH.  Comprehensive metabolic panel     Status: Abnormal   Collection Time: 06/21/17  1:07 PM  Result Value Ref Range   Sodium 135 135 - 145 mmol/L   Potassium 3.5 3.5 - 5.1 mmol/L   Chloride  100 (L) 101 - 111 mmol/L   CO2 24 22 - 32 mmol/L   Glucose, Bld 123 (H) 65 - 99 mg/dL   BUN 11 6 - 20 mg/dL   Creatinine, Ser 0.81 0.44 - 1.00 mg/dL   Calcium 9.4 8.9 - 10.3 mg/dL   Total Protein 8.2 (H) 6.5 - 8.1 g/dL   Albumin 4.1 3.5 - 5.0 g/dL   AST 29 15 - 41 U/L   ALT 19 14 - 54 U/L   Alkaline Phosphatase 65 38 - 126 U/L   Total Bilirubin 1.2 0.3 - 1.2 mg/dL   GFR calc non Af Amer >60 >60 mL/min   GFR calc Af Amer >60 >60 mL/min    Comment: (NOTE) The eGFR has been calculated using the CKD EPI equation. This calculation has not been validated in all clinical situations. eGFR's persistently <60 mL/min signify possible Chronic Kidney Disease.    Anion gap 11 5 - 15  CBC     Status: Abnormal   Collection Time: 06/21/17  1:07 PM  Result Value Ref Range   WBC 15.3 (H) 3.6 - 11.0 K/uL   RBC 4.95 3.80 - 5.20 MIL/uL   Hemoglobin 16.0 12.0 - 16.0 g/dL   HCT 47.1 (H) 35.0 - 47.0 %   MCV 95.3 80.0 - 100.0 fL   MCH 32.3 26.0 - 34.0 pg   MCHC 33.9 32.0 - 36.0 g/dL   RDW 13.3 11.5 - 14.5 %   Platelets 307 150 - 440 K/uL  Urinalysis, Complete w Microscopic     Status: Abnormal   Collection Time: 06/21/17  1:07 PM  Result Value Ref Range   Color, Urine YELLOW (A) YELLOW   APPearance CLEAR (A) CLEAR   Specific Gravity, Urine 1.017 1.005 - 1.030   pH 6.0 5.0 - 8.0   Glucose, UA NEGATIVE NEGATIVE mg/dL   Hgb urine dipstick NEGATIVE NEGATIVE   Bilirubin Urine NEGATIVE  NEGATIVE   Ketones, ur 20 (A) NEGATIVE mg/dL   Protein, ur 30 (A) NEGATIVE mg/dL   Nitrite NEGATIVE NEGATIVE   Leukocytes, UA NEGATIVE NEGATIVE   RBC / HPF 0-5 0 - 5 RBC/hpf   WBC, UA 0-5 0 - 5 WBC/hpf   Bacteria, UA NONE SEEN NONE SEEN   Squamous Epithelial / LPF 0-5 (A) NONE SEEN   Mucous PRESENT   Troponin I     Status: None   Collection Time: 06/21/17  1:07 PM  Result Value Ref Range   Troponin I <0.03 <0.03 ng/mL  Lipid panel     Status: Abnormal   Collection Time: 06/21/17  1:07 PM  Result Value Ref Range   Cholesterol 210 (H) 0 - 200 mg/dL   Triglycerides 77 <150 mg/dL   HDL 101 >40 mg/dL   Total CHOL/HDL Ratio 2.1 RATIO   VLDL 15 0 - 40 mg/dL   LDL Cholesterol 94 0 - 99 mg/dL    Comment:        Total Cholesterol/HDL:CHD Risk Coronary Heart Disease Risk Table                     Men   Women  1/2 Average Risk   3.4   3.3  Average Risk       5.0   4.4  2 X Average Risk   9.6   7.1  3 X Average Risk  23.4   11.0        Use the calculated Patient Ratio above and the CHD  Risk Table to determine the patient's CHD Risk.        ATP III CLASSIFICATION (LDL):  <100     mg/dL   Optimal  100-129  mg/dL   Near or Above                    Optimal  130-159  mg/dL   Borderline  160-189  mg/dL   High  >190     mg/dL   Very High   Lipase, blood     Status: Abnormal   Collection Time: 06/22/17  3:56 AM  Result Value Ref Range   Lipase 132 (H) 11 - 51 U/L  Comprehensive metabolic panel     Status: Abnormal   Collection Time: 06/22/17  3:56 AM  Result Value Ref Range   Sodium 135 135 - 145 mmol/L   Potassium 3.5 3.5 - 5.1 mmol/L   Chloride 106 101 - 111 mmol/L   CO2 20 (L) 22 - 32 mmol/L   Glucose, Bld 87 65 - 99 mg/dL   BUN 11 6 - 20 mg/dL   Creatinine, Ser 0.57 0.44 - 1.00 mg/dL   Calcium 8.3 (L) 8.9 - 10.3 mg/dL   Total Protein 6.5 6.5 - 8.1 g/dL   Albumin 3.2 (L) 3.5 - 5.0 g/dL   AST 20 15 - 41 U/L   ALT 16 14 - 54 U/L   Alkaline Phosphatase 61 38 - 126 U/L    Total Bilirubin 1.2 0.3 - 1.2 mg/dL   GFR calc non Af Amer >60 >60 mL/min   GFR calc Af Amer >60 >60 mL/min    Comment: (NOTE) The eGFR has been calculated using the CKD EPI equation. This calculation has not been validated in all clinical situations. eGFR's persistently <60 mL/min signify possible Chronic Kidney Disease.    Anion gap 9 5 - 15  CBC     Status: Abnormal   Collection Time: 06/22/17  3:56 AM  Result Value Ref Range   WBC 15.6 (H) 3.6 - 11.0 K/uL   RBC 4.43 3.80 - 5.20 MIL/uL   Hemoglobin 14.5 12.0 - 16.0 g/dL   HCT 42.5 35.0 - 47.0 %   MCV 95.9 80.0 - 100.0 fL   MCH 32.8 26.0 - 34.0 pg   MCHC 34.2 32.0 - 36.0 g/dL   RDW 13.1 11.5 - 14.5 %   Platelets 266 150 - 440 K/uL  Glucose, capillary     Status: None   Collection Time: 06/22/17  8:16 AM  Result Value Ref Range   Glucose-Capillary 75 65 - 99 mg/dL   Comment 1 Notify RN     Ct Abdomen Pelvis W Contrast  Result Date: 06/21/2017 CLINICAL DATA:  With onset of vomiting, diaphoresis, abdominal pain and diarrhea yesterday after eating tiramisu, generalized abdominal pain today, history hypertension, asthma, former smoker EXAM: CT ABDOMEN AND PELVIS WITH CONTRAST TECHNIQUE: Multidetector CT imaging of the abdomen and pelvis was performed using the standard protocol following bolus administration of intravenous contrast. Sagittal and coronal MPR images reconstructed from axial data set. CONTRAST:  198m ISOVUE-300 IOPAMIDOL (ISOVUE-300) INJECTION 61% IV. Dilute oral contrast. COMPARISON:  None FINDINGS: Lower chest: Minimal atelectasis at lung bases Hepatobiliary: Gallbladder surgically absent. Extrahepatic biliary dilatation, CBD 11 mm. Nonspecific 6 mm intermediate attenuation nodule RIGHT lobe liver image 18. No significant intrahepatic biliary dilatation or additional hepatic mass lesion. Pancreas: Peripancreatic edema particularly at the head and uncinate process extending to the body. Edema extends into the root  of the  small bowel mesenteric, para-aortic, and adjacent to the duodenum. Findings favor focal pancreatitis. No discrete pancreatic mass or pseudocyst. Spleen: Normal appearance Adrenals/Urinary Tract: Adrenal glands, kidneys, ureters, and bladder normal appearance. Edema from the peripancreatic and very duodenal region extends to the RIGHT kidney but this does not appear to be the epicenter of the process. Stomach/Bowel: Minimal thickening of the duodenum felt to be secondary to pancreatitis rather than the primary cause of the peripancreatic and. Duodenal edema. Stomach appears mildly distended with a small moderate-sized hiatal hernia. Surgical absence of appendix. Scattered colonic diverticulosis greatest at sigmoid colon. Remaining bowel loops unremarkable. Vascular/Lymphatic: Atherosclerotic calcifications aorta and iliac arteries as well as coronary arteries. Aorta normal caliber. Portal vein, SMV and splenic vein appear patent. Reproductive: Unremarkable uterus and adnexa Other: No free air or free fluid.  No hernia. Musculoskeletal: Bones demineralized. IMPRESSION: Peripancreatic edema particularly at the pancreatic head and uncinate process favoring acute pancreatitis ; correlation with serum lipase recommended. Peripancreatic edema extends periduodenal region, into root of small bowel mesentery, para-aortic, and to the RIGHT along Gerota's fascia and into the RIGHT perinephric space. No discrete pancreatic mass or pseudocyst identified. Extrahepatic biliary dilatation, CBD 11 mm, recommend correlation with LFTs to exclude distal CBD obstruction. Hiatal hernia. Electronically Signed   By: Lavonia Dana M.D.   On: 06/21/2017 16:41    Review of Systems  Constitutional: Negative for chills and fever.  HENT: Negative for hearing loss and tinnitus.   Eyes: Negative for blurred vision and double vision.  Respiratory: Negative for cough and hemoptysis.   Cardiovascular: Negative for chest pain and palpitations.   Gastrointestinal: Positive for abdominal pain, nausea and vomiting. Negative for heartburn.  Genitourinary: Negative for dysuria and urgency.  Musculoskeletal: Negative for myalgias and neck pain.  Skin: Negative for itching and rash.  Neurological: Negative for dizziness and headaches.  Endo/Heme/Allergies: Negative for polydipsia. Does not bruise/bleed easily.  Psychiatric/Behavioral: Negative for depression and suicidal ideas.   Blood pressure 137/64, pulse 86, temperature 97.8 F (36.6 C), temperature source Oral, resp. rate 20, height 5' (1.524 m), weight 130 lb 14.4 oz (59.4 kg), SpO2 96 %. Physical Exam  Constitutional: She is oriented to person, place, and time. She appears well-developed and well-nourished. No distress.  HENT:  Head: Normocephalic and atraumatic.  Mouth/Throat: No oropharyngeal exudate.  Eyes: Conjunctivae are normal. No scleral icterus.  Neck: Neck supple. No thyromegaly present.  Cardiovascular: Normal rate and regular rhythm.   Respiratory: Effort normal and breath sounds normal.  GI: Soft. Bowel sounds are normal. She exhibits no distension and no mass. There is tenderness (Epigastric tenderness with palpation). There is no rebound and no guarding.  Musculoskeletal: Normal range of motion. She exhibits no edema.  Neurological: She is alert and oriented to person, place, and time.  Skin: Skin is dry. No rash noted.  Psychiatric: She has a normal mood and affect. Thought content normal.    Assessment/Plan: Acute pancreatitis by history, labs, and CT scan    - CT scan shows no etiology    - calcium and triglycerides are normal    - no significant alcohol, normal liver enzymes Hypoalbuminemia Hypocalcemia Leukocytosis  Admitted with acute pancreatitis. Etiology is unclear. No evidence for biliary, gallstone or alcohol related pancreatitis. Continued symptoms this morning. Recommend ongoing bowel rest with aggressive IV fluids. There is no indication for  antibiotics or endoscopy at this time.  If this is truly idiopathic acute pancreatitis, may want to consider having her  seen in the Pancreas clinic at [facility] for further investigation.   Thank you for allowing me to participate in Mrs. Gotzen's care. Please call with any questions or concerns.   Thornton Park 06/22/2017, 6:38 PM

## 2017-06-23 DIAGNOSIS — K8500 Idiopathic acute pancreatitis without necrosis or infection: Principal | ICD-10-CM

## 2017-06-23 LAB — GLUCOSE, CAPILLARY: Glucose-Capillary: 88 mg/dL (ref 65–99)

## 2017-06-23 LAB — CBC
HCT: 37.8 % (ref 35.0–47.0)
HEMOGLOBIN: 12.9 g/dL (ref 12.0–16.0)
MCH: 33.1 pg (ref 26.0–34.0)
MCHC: 34.1 g/dL (ref 32.0–36.0)
MCV: 96.9 fL (ref 80.0–100.0)
Platelets: 237 10*3/uL (ref 150–440)
RBC: 3.9 MIL/uL (ref 3.80–5.20)
RDW: 13.1 % (ref 11.5–14.5)
WBC: 13.7 10*3/uL — ABNORMAL HIGH (ref 3.6–11.0)

## 2017-06-23 LAB — LIPASE, BLOOD: LIPASE: 32 U/L (ref 11–51)

## 2017-06-23 MED ORDER — MAGNESIUM CITRATE PO SOLN
1.0000 | Freq: Once | ORAL | Status: AC
  Administered 2017-06-23: 1 via ORAL
  Filled 2017-06-23: qty 296

## 2017-06-23 MED ORDER — BISACODYL 5 MG PO TBEC: 10.0000 mg | DELAYED_RELEASE_TABLET | Freq: Every day | ORAL | Status: DC | PRN

## 2017-06-23 MED ORDER — SENNA 8.6 MG PO TABS
1.0000 | ORAL_TABLET | Freq: Every day | ORAL | Status: DC
  Administered 2017-06-23: 8.6 mg via ORAL
  Filled 2017-06-23: qty 1

## 2017-06-23 NOTE — Discharge Instructions (Signed)
Low fat diet. Dr. Christain SacramentoBeaver suggests follow up the Pancreas clinic at [facility] for further investigation.

## 2017-06-23 NOTE — Care Management Important Message (Signed)
Important Message  Patient Details  Name: Elmon Elseaula J Hing MRN: [id number] Date of Birth: 07/31/1941   Medicare Important Message Given:  Yes    Chapman FitchBOWEN, Aadvika Konen T, RN 06/23/2017, 2:31 PM

## 2017-06-23 NOTE — Progress Notes (Signed)
Pt iv out, med from pharmacy given, paperwork signed, and taken out in wheelchair by cna to son car waiting downstairs. Pt left in stable condition.

## 2017-06-23 NOTE — Care Management (Signed)
No RNCM needs identified.  RNCM signing off. Please reconsult if indicated.

## 2017-06-23 NOTE — Discharge Summary (Signed)
[facility] [place] at [facility]   PATIENT NAME: Courtney Chandler    MR#:  [id number]  DATE OF BIRTH:  [birth year]  DATE OF ADMISSION:  06/21/2017   ADMITTING PHYSICIAN: Marguarite Arbour, MD  DATE OF DISCHARGE: 06/23/2017  PRIMARY CARE PHYSICIAN: Excell Seltzer, MD   ADMISSION DIAGNOSIS:  Idiopathic acute pancreatitis, unspecified complication status [K85.00] DISCHARGE DIAGNOSIS:  Principal Problem:   Acute pancreatitis Active Problems:   POST-POLIO SYNDROME   Diffuse abdominal pain   Intractable nausea and vomiting  SECONDARY DIAGNOSIS:   Past Medical History:  Diagnosis Date  . Allergy   . Anxiety    associated with dyphagia  . Arthritis    osteo of left hip  . Asthma    moderate persistant  . 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 Deviated septum    2002  . Hypertension   . 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 Post-polio syndrome   . Scarlet fever   . Sleep apnea    severe - CPAP  . Wears dentures    lower partial   HOSPITAL COURSE:  Acute pancreatitis The patient has mild abdominal pain and nausea, But no vomiting or diarrhea. She has been treated with IV fluids and prn IV pain meds. CT abd/pelvis showed pancreatitis. Lipase decreased to 32.   Hypertension. Controlled. History of Asthma. Stable.  DISCHARGE CONDITIONS:  Stable, discharge to home today CONSULTS OBTAINED:  Treatment Team:  Tressia Danas, MD Midge Minium, MD DRUG ALLERGIES:   Allergies  Allergen Reactions  . Beta Adrenergic Blockers     Asthma exacerbation  . Codeine     REACTION: Nausea and vomiting  . Erythromycin     REACTION: Nausea and vomiting   DISCHARGE MEDICATIONS:   Allergies as of 06/23/2017      Reactions   Beta Adrenergic Blockers    Asthma exacerbation   Codeine    REACTION: Nausea and vomiting   Erythromycin    REACTION: Nausea and vomiting      Medication List    TAKE these medications   acetaminophen 325 MG tablet Commonly known as:  TYLENOL Take 325 mg by mouth daily as needed.   ADVAIR DISKUS 500-50 MCG/DOSE Aepb Generic drug:  Fluticasone-Salmeterol INHALE ONE DOSE BY MOUTH EVERY 12 HOURS   aspirin 325 MG EC tablet Take 325 mg by mouth at bedtime as needed for pain.   CALTRATE 600+D 600-400 MG-UNIT tablet Generic drug:  Calcium Carbonate-Vitamin D Take 1 tablet by mouth daily.   cetirizine 10 MG tablet Commonly known as:  ZYRTEC Take 10 mg by mouth daily.   Dextromethorphan-Guaifenesin 20-400 MG Tabs Take by mouth 4 (four) times daily as needed.   fish oil-omega-3 fatty acids 1000 MG capsule Take 1 g by mouth 2 (two) times daily.   Glucosamine-Chondroitin-MSM 500-400-300 MG Tabs Take 1 tablet by mouth 2 (two) times daily.   latanoprost 0.005 % ophthalmic solution Commonly known as:  XALATAN Place 1 drop into the left eye at bedtime.   Melatonin 5 MG Tabs Take 20-40 mg by mouth at bedtime.   montelukast 10 MG tablet Commonly known as:  SINGULAIR TAKE ONE TABLET BY MOUTH AT BEDTIME   multivitamin with minerals tablet Take 1 tablet by mouth daily.   NEXIUM PO Take 1 capsule by mouth  daily.   oxyCODONE-acetaminophen 5-325 MG tablet Commonly known as:  PERCOCET/ROXICET Take 1 tablet by mouth every 8 (eight) hours as needed for moderate pain.   PROAIR HFA 108 (90 Base) MCG/ACT inhaler Generic drug:  albuterol INHALE TWO PUFFS BY MOUTH 4 TIMES DAILY   senna 8.6 MG tablet Commonly known as:  SENOKOT Take 3 tablets by mouth 2 (two) times daily.        DISCHARGE INSTRUCTIONS:  See AVS.  If you experience worsening of your admission symptoms, develop shortness of breath, life threatening emergency, suicidal or homicidal thoughts you must seek medical attention immediately by calling 911 or calling your MD immediately  if symptoms less  severe.  You Must read complete instructions/literature along with all the possible adverse reactions/side effects for all the Medicines you take and that have been prescribed to you. Take any new Medicines after you have completely understood and accpet all the possible adverse reactions/side effects.   Please note  You were cared for by a hospitalist during your hospital stay. If you have any questions about your discharge medications or the care you received while you were in the hospital after you are discharged, you can call the unit and asked to speak with the hospitalist on call if the hospitalist that took care of you is not available. Once you are discharged, your primary care physician will handle any further medical issues. Please note that NO REFILLS for any discharge medications will be authorized once you are discharged, as it is imperative that you return to your primary care physician (or establish a relationship with a primary care physician if you do not have one) for your aftercare needs so that they can reassess your need for medications and monitor your lab values.    On the day of Discharge:  VITAL SIGNS:  Blood pressure 140/60, pulse 85, temperature 98.2 F (36.8 C), temperature source Oral, resp. rate 20, height 5' (1.524 m), weight 137 lb (62.1 kg), SpO2 92 %. PHYSICAL EXAMINATION:  GENERAL:  76 y.o.-year-old patient lying in the bed with no acute distress.  EYES: Pupils equal, round, reactive to light and accommodation. No scleral icterus. Extraocular muscles intact.  HEENT: Head atraumatic, normocephalic. Oropharynx and nasopharynx clear.  NECK:  Supple, no jugular venous distention. No thyroid enlargement, no tenderness.  LUNGS: Normal breath sounds bilaterally, no wheezing, rales,rhonchi or crepitation. No use of accessory muscles of respiration.  CARDIOVASCULAR: S1, S2 normal. No murmurs, rubs, or gallops.  ABDOMEN: Soft, non-tender, non-distended. Bowel sounds  present. No organomegaly or mass.  EXTREMITIES: No pedal edema, cyanosis, or clubbing.  NEUROLOGIC: Cranial nerves II through XII are intact. Muscle strength 5/5 in all extremities. Sensation intact. Gait not checked.  PSYCHIATRIC: The patient is alert and oriented x 3.  SKIN: No obvious rash, lesion, or ulcer.  DATA REVIEW:   CBC  Recent Labs Lab 06/23/17 0442  WBC 13.7*  HGB 12.9  HCT 37.8  PLT 237    Chemistries   Recent Labs Lab 06/22/17 0356  NA 135  K 3.5  CL 106  CO2 20*  GLUCOSE 87  BUN 11  CREATININE 0.57  CALCIUM 8.3*  AST 20  ALT 16  ALKPHOS 61  BILITOT 1.2     Microbiology Results  Results for orders placed or performed in visit on 01/18/15  Fecal occult blood, imunochemical     Status: Abnormal   Collection Time: 01/18/15  3:10 PM  Result Value Ref Range Status   Fecal  Occult Bld Positive (A) Negative Final    RADIOLOGY:  No results found.   Management plans discussed with the patient, family and they are in agreement.  CODE STATUS: DNR   TOTAL TIME TAKING CARE OF THIS PATIENT: 32 minutes.    Shaune Pollack M.D on 06/23/2017 at 4:05 PM  Between 7am to 6pm - Pager - [phone]  After 6pm go to [email or web address] - Social research officer, government  [facility] [place] [facility]  Office  [phone]  CC: Primary care physician; Excell Seltzer, MD   Note: This dictation was prepared with Dragon dictation along with smaller phrase technology. Any transcriptional errors that result from this process are unintentional.

## 2017-06-23 NOTE — Progress Notes (Signed)
Midge Minium, MD [facility]   [address] [place], [place] [address] Phone: [phone] Fax : [phone]   Subjective: This patient was admitted with abdominal pain and vomiting and pancreas. The patient's lipase is now down to normal. The patient continues to have lower abdominal pain that she states has gotten better when she moves her bowels. There is no report of any fevers or chills. The patient also reports that she has chronic back pain which is also causing her some nausea. The patient's white cell count was 15.3 on admission and is now down to 13.7   Objective: Vital signs in last 24 hours: Vitals:   06/22/17 1952 06/23/17 0500 06/23/17 0514 06/23/17 1216  BP: 129/63  (!) 142/59 140/60  Pulse: 90  99 85  Resp: 20  20   Temp: 98.5 F (36.9 C)  97.9 F (36.6 C) 98.2 F (36.8 C)  TempSrc: Oral  Oral Oral  SpO2: 93%  95% 92%  Weight:  137 lb (62.1 kg)    Height:       Weight change: 11 lb (4.99 kg)  Intake/Output Summary (Last 24 hours) at 06/23/17 1228 Last data filed at 06/23/17 1045  Gross per 24 hour  Intake           3712.5 ml  Output              550 ml  Net           3162.5 ml     Exam: Heart:: Regular rate and rhythm, S1S2 present or without murmur or extra heart sounds Lungs: normal and clear to auscultation and percussion Abdomen: soft, nontender, normal bowel sounds   Lab Results: @LABTEST2 @ Micro Results: No results found for this or any previous visit (from the past 240 hour(s)). Studies/Results: Ct Abdomen Pelvis W Contrast  Result Date: 06/21/2017 CLINICAL DATA:  With onset of vomiting, diaphoresis, abdominal pain and diarrhea yesterday after eating tiramisu, generalized abdominal pain today, history hypertension, asthma, former smoker EXAM: CT ABDOMEN AND PELVIS WITH CONTRAST TECHNIQUE: Multidetector CT imaging of the abdomen and pelvis was performed using the standard protocol following bolus administration of intravenous contrast.  Sagittal and coronal MPR images reconstructed from axial data set. CONTRAST:  ISOVUE-300 IOPAMIDOL (ISOVUE-300) INJECTION 61% IV. Dilute oral contrast. COMPARISON:  None FINDINGS: Lower chest: Minimal atelectasis at lung bases Hepatobiliary: Gallbladder surgically absent. Extrahepatic biliary dilatation, CBD 11 mm. Nonspecific 6 mm intermediate attenuation nodule RIGHT lobe liver image 18. No significant intrahepatic biliary dilatation or additional hepatic mass lesion. Pancreas: Peripancreatic edema particularly at the head and uncinate process extending to the body. Edema extends into the root of the small bowel mesenteric, para-aortic, and adjacent to the duodenum. Findings favor focal pancreatitis. No discrete pancreatic mass or pseudocyst. Spleen: Normal appearance Adrenals/Urinary Tract: Adrenal glands, kidneys, ureters, and bladder normal appearance. Edema from the peripancreatic and very duodenal region extends to the RIGHT kidney but this does not appear to be the epicenter of the process. Stomach/Bowel: Minimal thickening of the duodenum felt to be secondary to pancreatitis rather than the primary cause of the peripancreatic and. Duodenal edema. Stomach appears mildly distended with a small moderate-sized hiatal hernia. Surgical absence of appendix. Scattered colonic diverticulosis greatest at sigmoid colon. Remaining bowel loops unremarkable. Vascular/Lymphatic: Atherosclerotic calcifications aorta and iliac arteries as well as coronary arteries. Aorta normal caliber. Portal vein, SMV and splenic vein appear patent. Reproductive: Unremarkable uterus and adnexa Other: No free air or free fluid.  No  hernia. Musculoskeletal: Bones demineralized. IMPRESSION: Peripancreatic edema particularly at the pancreatic head and uncinate process favoring acute pancreatitis ; correlation with serum lipase recommended. Peripancreatic edema extends periduodenal region, into root of small bowel mesentery, para-aortic,  and to the RIGHT along Gerota's fascia and into the RIGHT perinephric space. No discrete pancreatic mass or pseudocyst identified. Extrahepatic biliary dilatation, CBD 11 mm, recommend correlation with LFTs to exclude distal CBD obstruction. Hiatal hernia. Electronically Signed   By: Ulyses SouthwardMark  Boles M.D.   On: 06/21/2017 16:41   Medications: I have reviewed the patient's current medications. Scheduled Meds: . Fluticasone-Salmeterol  1 puff Inhalation BID  . heparin  5,000 Units Subcutaneous Q8H  . latanoprost  1 drop Left Eye QHS  . magnesium citrate  1 Bottle Oral Once  . pantoprazole (PROTONIX) IV  40 mg Intravenous Q12H  . senna  1 tablet Oral Daily   Continuous Infusions: PRN Meds:.acetaminophen **OR** acetaminophen, albuterol, bisacodyl, HYDROmorphone (DILAUDID) injection, ondansetron **OR** ondansetron (ZOFRAN) IV, oxyCODONE-acetaminophen, promethazine   Assessment: Principal Problem:   Acute pancreatitis Active Problems:   POST-POLIO SYNDROME   Diffuse abdominal pain   Intractable nausea and vomiting    Plan: This patient was admitted with what appears to mild pain has which has now resolved with normal lipase. The patient continues to have abdominal pain and states that she has felt better with moving her bowels. The patient will be given a bottle of magnesium citrate to be sipped slowly to avoid any further nausea and to see if her bowels helps her lower abdominal pain and bloating. The patient has been explained the plan and agrees with it.   LOS: 2 days   Midge MiniumDarren Kirin Brandenburger 06/23/2017, 12:28 PM

## 2017-06-25 ENCOUNTER — Encounter: Payer: Self-pay | Admitting: *Deleted

## 2017-06-25 ENCOUNTER — Telehealth: Payer: Self-pay | Admitting: *Deleted

## 2017-06-25 NOTE — Telephone Encounter (Signed)
Transition Care Management Follow-up Telephone Call   Date discharged? 06/23/17   How have you been since you were released from the hospital? "good, getting better"   Do you understand why you were in the hospital? yes   Do you understand the discharge instructions? yes   Where were you discharged to? Home   Items Reviewed:  Medications reviewed: yes  Allergies reviewed: yes  Dietary changes reviewed: yes  Referrals reviewed: yes   Functional Questionnaire:   Activities of Daily Living (ADLs):   She states they are independent in the following: ambulation, bathing and hygiene, feeding, continence, grooming, toileting and dressing States they require assistance with the following: None   Any transportation issues/concerns?: no   Any patient concerns? Yes, pt states that she would like some nausea medicine. States that she has felt the best she has felt this morning but she normally gets nauseous in the afternoon. She was not discharged with nausea medications.   Confirmed importance and date/time of follow-up visits scheduled yes  Provider Appointment booked with  Confirmed with patient if condition begins to worsen call PCP or go to the ER.  Patient was given the office number and encouraged to call back with question or concerns.  : yes

## 2017-06-26 ENCOUNTER — Telehealth: Payer: Self-pay

## 2017-06-26 MED ORDER — ONDANSETRON HCL 8 MG PO TABS: 8.0000 mg | ORAL_TABLET | Freq: Three times a day (TID) | ORAL | 1 refills | Status: DC | PRN

## 2017-06-26 NOTE — Telephone Encounter (Signed)
Ok  zofran 8 mg ODT, 1 po q 8 hours prn nausea, #20, 1 refill

## 2017-06-26 NOTE — Telephone Encounter (Signed)
Spoke with pt and she is aware of Dr. Cyndie Chimeopland's recommendation. Pt agreed to the medication being called in. Her rx was sent. She had no additional questions at this time.

## 2017-06-26 NOTE — Addendum Note (Signed)
Addended by: Garfield CorneaMABRY, JASMINE L on: 06/26/2017 04:18 PM   Modules accepted: Orders

## 2017-06-26 NOTE — Telephone Encounter (Signed)
error 

## 2017-06-26 NOTE — Telephone Encounter (Signed)
Patient recently discharged from hospital for acute pancreatitis and abdominal pain.  Per TCM report below, patient is improving but is having some increased nausea through the afternoon and was not given any nausea medication at discharge.  She is requesting that we call some in to her pharmacy.  Please advise.  Patient has an appointment tomorrow with Dr. Ermalene SearingBedsole.  Thanks.

## 2017-06-27 ENCOUNTER — Telehealth: Payer: Self-pay | Admitting: Family Medicine

## 2017-06-27 ENCOUNTER — Encounter: Payer: Self-pay | Admitting: Family Medicine

## 2017-06-27 ENCOUNTER — Ambulatory Visit (INDEPENDENT_AMBULATORY_CARE_PROVIDER_SITE_OTHER)
Admission: RE | Admit: 2017-06-27 | Discharge: 2017-06-27 | Disposition: A | Discharge status: Home / Self Care | Payer: Medicare Other | Source: Ambulatory Visit | Attending: Family Medicine | Admitting: Family Medicine

## 2017-06-27 ENCOUNTER — Ambulatory Visit (INDEPENDENT_AMBULATORY_CARE_PROVIDER_SITE_OTHER): Payer: Medicare Other | Admitting: Family Medicine

## 2017-06-27 ENCOUNTER — Other Ambulatory Visit: Payer: Self-pay | Admitting: Family Medicine

## 2017-06-27 VITALS — BP 142/80 | HR 76 | Temp 98.1°F | Ht 60.0 in | Wt 130.0 lb

## 2017-06-27 DIAGNOSIS — J90 Pleural effusion, not elsewhere classified: Secondary | ICD-10-CM | POA: Diagnosis not present

## 2017-06-27 DIAGNOSIS — R05 Cough: Secondary | ICD-10-CM | POA: Diagnosis not present

## 2017-06-27 DIAGNOSIS — R059 Cough, unspecified: Secondary | ICD-10-CM | POA: Insufficient documentation

## 2017-06-27 DIAGNOSIS — K8500 Idiopathic acute pancreatitis without necrosis or infection: Secondary | ICD-10-CM

## 2017-06-27 DIAGNOSIS — B370 Candidal stomatitis: Secondary | ICD-10-CM

## 2017-06-27 LAB — CBC WITH DIFFERENTIAL/PLATELET
BASOS ABS: 0.1 10*3/uL (ref 0.0–0.1)
Basophils Relative: 0.9 % (ref 0.0–3.0)
Eosinophils Absolute: 0.2 10*3/uL (ref 0.0–0.7)
Eosinophils Relative: 1.9 % (ref 0.0–5.0)
HCT: 38.9 % (ref 36.0–46.0)
HEMOGLOBIN: 13.2 g/dL (ref 12.0–15.0)
LYMPHS ABS: 1.6 10*3/uL (ref 0.7–4.0)
LYMPHS PCT: 15.3 % (ref 12.0–46.0)
MCHC: 33.9 g/dL (ref 30.0–36.0)
MCV: 96.9 fl (ref 78.0–100.0)
MONOS PCT: 11.6 % (ref 3.0–12.0)
Monocytes Absolute: 1.2 10*3/uL — ABNORMAL HIGH (ref 0.1–1.0)
NEUTROS PCT: 70.3 % (ref 43.0–77.0)
Neutro Abs: 7.2 10*3/uL (ref 1.4–7.7)
Platelets: 400 10*3/uL (ref 150.0–400.0)
RBC: 4.01 Mil/uL (ref 3.87–5.11)
RDW: 13 % (ref 11.5–15.5)
WBC: 10.2 10*3/uL (ref 4.0–10.5)

## 2017-06-27 LAB — LIPASE: Lipase: 19 U/L (ref 11.0–59.0)

## 2017-06-27 LAB — COMPREHENSIVE METABOLIC PANEL
ALK PHOS: 141 U/L — AB (ref 39–117)
ALT: 51 U/L — AB (ref 0–35)
AST: 67 U/L — ABNORMAL HIGH (ref 0–37)
Albumin: 3.3 g/dL — ABNORMAL LOW (ref 3.5–5.2)
BILIRUBIN TOTAL: 0.4 mg/dL (ref 0.2–1.2)
BUN: 7 mg/dL (ref 6–23)
CO2: 27 mEq/L (ref 19–32)
Calcium: 9.2 mg/dL (ref 8.4–10.5)
Chloride: 99 mEq/L (ref 96–112)
Creatinine, Ser: 0.72 mg/dL (ref 0.40–1.20)
GFR: 83.9 mL/min (ref >60.00)
GLUCOSE: 82 mg/dL (ref 70–99)
Potassium: 3 mEq/L — ABNORMAL LOW (ref 3.5–5.1)
Sodium: 135 mEq/L (ref 135–145)
TOTAL PROTEIN: 6.5 g/dL (ref 6.0–8.3)

## 2017-06-27 MED ORDER — NYSTATIN 100000 UNIT/ML MT SUSP: 5.0000 mL | Freq: Four times a day (QID) | OROMUCOSAL | 0 refills | Status: DC

## 2017-06-27 MED ORDER — POTASSIUM CHLORIDE CRYS ER 20 MEQ PO TBCR: 20.0000 meq | EXTENDED_RELEASE_TABLET | Freq: Every day | ORAL | 0 refills | Status: DC

## 2017-06-27 NOTE — Progress Notes (Signed)
Subjective:    Patient ID: Courtney Chandler, female    DOB: [birth year], 76 y.o.   MRN: [id number]  HPI   76 year old female pt presents for follow up hospitalization.  Admitted on 06/21/2017 for acute pancreatitis  Discharged on 8/13  She has been treated withIV fluids and prn pain meds. CT abd/pelvis showed IMPRESSION: Peripancreatic edema particularly at the pancreatic head and uncinate process favoring acute pancreatitis ; correlation with serum lipase recommended.  Peripancreatic edema extends periduodenal region, into root of small bowel mesentery, para-aortic, and to the RIGHT along Gerota's fascia and into the RIGHT perinephric space.  No discrete pancreatic mass or pseudocyst identified.  Extrahepatic biliary dilatation, CBD 11 mm, recommend correlation with LFTs to exclude distal CBD obstruction.  Initial lipase 523.Marland Kitchen decrease to 37 at discharge.  Wbc  15.3 on admission down to  13.7 on 8/13.  She does not drink and no gallstone seen,  She did recetntly increase red yeast rice ( ? Is cause pancreatitis)  Today she reports  She is feeling better from a GI point of view. She is not longer vomiting. Pain in  Upper abdomen almost resolved.  She has not been eating very much, but trying to keep up with fluids.  Yesterday she note cough productive of yellow sputum. She is short of breath, no wheeze. No ear pain, no ST No fever. She has started having some central back pain right chest wall pain. She has not been able to use CpAP machine.  She has also noted white plaque in mouth.. Feels dry irritate in throat  Blood pressure (!) 142/80, pulse 76, temperature 98.1 F (36.7 C), temperature source Oral, height 5' (1.524 m), weight 130 lb (59 kg), SpO2 96 %.  Review of Systems  Constitutional: Negative for fatigue and fever.  HENT: Negative for congestion.   Eyes: Negative for pain.  Respiratory: Positive for cough and shortness of breath.   Cardiovascular: Positive  for chest pain. Negative for palpitations and leg swelling.  Gastrointestinal: Negative for abdominal pain.  Genitourinary: Negative for dysuria and vaginal bleeding.  Musculoskeletal: Negative for back pain.  Neurological: Negative for syncope, light-headedness and headaches.  Psychiatric/Behavioral: Negative for dysphoric mood.       Objective:   Physical Exam  Constitutional: Vital signs are normal. She appears well-developed and well-nourished. She is cooperative.  Non-toxic appearance. She does not appear ill. No distress.  HENT:  Head: Normocephalic.  Right Ear: Hearing, tympanic membrane, external ear and ear canal normal.  Left Ear: Hearing, tympanic membrane, external ear and ear canal normal.  Nose: Nose normal.  Eyes: Pupils are equal, round, and reactive to light. Conjunctivae, EOM and lids are normal. Lids are everted and swept, no foreign bodies found.  Neck: Trachea normal and normal range of motion. Neck supple. Carotid bruit is not present. No thyroid mass and no thyromegaly present.  Cardiovascular: Normal rate, regular rhythm, S1 normal, S2 normal, normal heart sounds and intact distal pulses.  Exam reveals no gallop.   No murmur heard. Pulmonary/Chest: Effort normal. No respiratory distress. She has decreased breath sounds in the left upper field and the left middle field. She has no wheezes. She has no rhonchi. She has no rales.  Abdominal: Soft. Normal appearance and bowel sounds are normal. She exhibits no distension, no fluid wave, no abdominal bruit and no mass. There is no hepatosplenomegaly. There is no tenderness. There is no rebound, no guarding and no CVA tenderness. No hernia.  Lymphadenopathy:    She has no cervical adenopathy.    She has no axillary adenopathy.  Neurological: She is alert. She has normal strength. No cranial nerve deficit or sensory deficit.  Skin: Skin is warm, dry and intact. No rash noted.  Psychiatric: Her speech is normal and behavior  is normal. Judgment normal. Her mood appears not anxious. Cognition and memory are normal. She does not exhibit a depressed mood.          Assessment & Plan:

## 2017-06-27 NOTE — Patient Instructions (Addendum)
Swish and spit nystatin solution 4 times daily for thrush.  I will call you with  X-ray and lab results later today.

## 2017-06-27 NOTE — Telephone Encounter (Signed)
opened in error

## 2017-06-27 NOTE — Assessment & Plan Note (Signed)
Symptoms improving bu now with left flank pain and still sore on epigastrum on exam. Re-eval with labs for continued resolution.,  Unclear cause.. No gallstone, no ETOH.. Did recently increase red yeast rice (statins rarely can cause pancreatitis)

## 2017-06-27 NOTE — Assessment & Plan Note (Addendum)
CXR showed possible fluid in right lower lung field.. Will await labs and  Radiologist report.   May be in past due to recent pancreatitis.

## 2017-06-27 NOTE — Assessment & Plan Note (Signed)
Treat with nystatin solution.

## 2017-07-01 ENCOUNTER — Other Ambulatory Visit (INDEPENDENT_AMBULATORY_CARE_PROVIDER_SITE_OTHER): Payer: Medicare Other

## 2017-07-01 ENCOUNTER — Telehealth: Payer: Self-pay | Admitting: Family Medicine

## 2017-07-01 DIAGNOSIS — E7800 Pure hypercholesterolemia, unspecified: Secondary | ICD-10-CM

## 2017-07-01 DIAGNOSIS — M8589 Other specified disorders of bone density and structure, multiple sites: Secondary | ICD-10-CM

## 2017-07-01 DIAGNOSIS — I10 Essential (primary) hypertension: Secondary | ICD-10-CM

## 2017-07-01 LAB — LIPID PANEL
CHOL/HDL RATIO: 3
Cholesterol: 136 mg/dL (ref 0–200)
HDL: 49.5 mg/dL (ref >39.00)
LDL CALC: 50 mg/dL (ref 0–99)
NONHDL: 86.49
TRIGLYCERIDES: 184 mg/dL — AB (ref 0.0–149.0)
VLDL: 36.8 mg/dL (ref 0.0–40.0)

## 2017-07-01 LAB — COMPREHENSIVE METABOLIC PANEL
ALK PHOS: 102 U/L (ref 39–117)
ALT: 25 U/L (ref 0–35)
AST: 17 U/L (ref 0–37)
Albumin: 3.3 g/dL — ABNORMAL LOW (ref 3.5–5.2)
BILIRUBIN TOTAL: 0.4 mg/dL (ref 0.2–1.2)
BUN: 7 mg/dL (ref 6–23)
CALCIUM: 9.5 mg/dL (ref 8.4–10.5)
CO2: 29 meq/L (ref 19–32)
CREATININE: 0.76 mg/dL (ref 0.40–1.20)
Chloride: 103 mEq/L (ref 96–112)
GFR: 78.83 mL/min (ref >60.00)
Glucose, Bld: 92 mg/dL (ref 70–99)
Potassium: 3.8 mEq/L (ref 3.5–5.1)
Sodium: 137 mEq/L (ref 135–145)
Total Protein: 6.9 g/dL (ref 6.0–8.3)

## 2017-07-01 LAB — VITAMIN D 25 HYDROXY (VIT D DEFICIENCY, FRACTURES): VITD: 28.12 ng/mL — ABNORMAL LOW (ref 30.00–100.00)

## 2017-07-01 NOTE — Telephone Encounter (Signed)
-----   Message from Alvina Chou sent at 06/30/2017  2:51 PM EDT ----- Regarding: lab orders for Tuesday, 8.21.18 Patient is scheduled for CPX labs, please order future labs, Thanks , Camelia Eng

## 2017-08-13 DIAGNOSIS — H40001 Preglaucoma, unspecified, right eye: Secondary | ICD-10-CM | POA: Diagnosis not present

## 2017-09-26 ENCOUNTER — Ambulatory Visit (INDEPENDENT_AMBULATORY_CARE_PROVIDER_SITE_OTHER): Payer: Medicare Other

## 2017-09-26 DIAGNOSIS — Z23 Encounter for immunization: Secondary | ICD-10-CM

## 2017-10-09 ENCOUNTER — Telehealth: Payer: Self-pay | Admitting: Family Medicine

## 2017-10-09 DIAGNOSIS — M8589 Other specified disorders of bone density and structure, multiple sites: Secondary | ICD-10-CM

## 2017-10-09 DIAGNOSIS — E7800 Pure hypercholesterolemia, unspecified: Secondary | ICD-10-CM

## 2017-10-09 NOTE — Telephone Encounter (Signed)
-----   Message from Robert Bellowarlesia R Pinson, LPN sent at 16/10/960411/21/2018  5:07 PM EST ----- Regarding: Labs 11/30 Lab orders needed. Thank you.  Insurance:  Medicare  If none, please advise.

## 2017-10-10 ENCOUNTER — Ambulatory Visit (INDEPENDENT_AMBULATORY_CARE_PROVIDER_SITE_OTHER): Payer: Medicare Other

## 2017-10-10 VITALS — BP 122/76 | HR 76 | Temp 97.9°F | Ht 60.0 in | Wt 119.5 lb

## 2017-10-10 DIAGNOSIS — Z0000 Encounter for general adult medical examination without abnormal findings: Secondary | ICD-10-CM

## 2017-10-10 DIAGNOSIS — M8589 Other specified disorders of bone density and structure, multiple sites: Secondary | ICD-10-CM

## 2017-10-10 DIAGNOSIS — E7800 Pure hypercholesterolemia, unspecified: Secondary | ICD-10-CM | POA: Diagnosis not present

## 2017-10-10 DIAGNOSIS — Z23 Encounter for immunization: Secondary | ICD-10-CM

## 2017-10-10 LAB — LIPID PANEL
CHOL/HDL RATIO: 3
Cholesterol: 218 mg/dL — ABNORMAL HIGH (ref 0–200)
HDL: 74.3 mg/dL (ref >39.00)
LDL Cholesterol: 114 mg/dL — ABNORMAL HIGH (ref 0–99)
NONHDL: 143.36
Triglycerides: 146 mg/dL (ref 0.0–149.0)
VLDL: 29.2 mg/dL (ref 0.0–40.0)

## 2017-10-10 LAB — VITAMIN D 25 HYDROXY (VIT D DEFICIENCY, FRACTURES): VITD: 35.53 ng/mL (ref 30.00–100.00)

## 2017-10-10 LAB — COMPREHENSIVE METABOLIC PANEL
ALT: 15 U/L (ref 0–35)
AST: 17 U/L (ref 0–37)
Albumin: 4.2 g/dL (ref 3.5–5.2)
Alkaline Phosphatase: 50 U/L (ref 39–117)
BUN: 12 mg/dL (ref 6–23)
CO2: 28 meq/L (ref 19–32)
Calcium: 10.1 mg/dL (ref 8.4–10.5)
Chloride: 104 mEq/L (ref 96–112)
Creatinine, Ser: 0.77 mg/dL (ref 0.40–1.20)
GFR: 77.59 mL/min (ref >60.00)
GLUCOSE: 93 mg/dL (ref 70–99)
POTASSIUM: 3.9 meq/L (ref 3.5–5.1)
Sodium: 139 mEq/L (ref 135–145)
Total Bilirubin: 0.7 mg/dL (ref 0.2–1.2)
Total Protein: 7.3 g/dL (ref 6.0–8.3)

## 2017-10-10 NOTE — Patient Instructions (Signed)
[name] , Thank you for taking time to come for your Medicare Wellness Visit. I appreciate your ongoing commitment to your health goals. Please review the following plan we discussed and let me know if I can assist you in the future.   These are the goals we discussed: Goals    . Increase physical activity     Starting 10/10/2017, I will continue to walk at least 20 minutes daily.        This is a list of the screening recommended for you and due dates:  Health Maintenance  Topic Date Due  . Colon Cancer Screening  03/05/2025  . Tetanus Vaccine  10/11/2027  . Flu Shot  Completed  . DEXA scan (bone density measurement)  Completed  . Pneumonia vaccines  Completed   Preventive Care for Adults  A healthy lifestyle and preventive care can promote health and wellness. Preventive health guidelines for adults include the following key practices.  . A routine yearly physical is a good way to check with your health care provider about your health and preventive screening. It is a chance to share any concerns and updates on your health and to receive a thorough exam.  . Visit your dentist for a routine exam and preventive care every 6 months. Brush your teeth twice a day and floss once a day. Good oral hygiene prevents tooth decay and gum disease.  . The frequency of eye exams is based on your age, health, family medical history, use  of contact lenses, and other factors. Follow your health care provider's recommendations for frequency of eye exams.  . Eat a healthy diet. Foods like vegetables, fruits, whole grains, low-fat dairy products, and lean protein foods contain the nutrients you need without too many calories. Decrease your intake of foods high in solid fats, added sugars, and salt. Eat the right amount of calories for you. Get information about a proper diet from your health care provider, if necessary.  . Regular physical exercise is one of the most important things you can do  for your health. Most adults should get at least 150 minutes of moderate-intensity exercise (any activity that increases your heart rate and causes you to sweat) each week. In addition, most adults need muscle-strengthening exercises on 2 or more days a week.  Silver Sneakers may be a benefit available to you. To determine eligibility, you may visit the website: www.silversneakers.com or contact program at 314-114-78051-3103283060 Mon-Fri between 8AM-8PM.   . Maintain a healthy weight. The body mass index (BMI) is a screening tool to identify possible weight problems. It provides an estimate of body fat based on height and weight. Your health care provider can find your BMI and can help you achieve or maintain a healthy weight.   For adults 20 years and older: ? A BMI below 18.5 is considered underweight. ? A BMI of 18.5 to 24.9 is normal. ? A BMI of 25 to 29.9 is considered overweight. ? A BMI of 30 and above is considered obese.   . Maintain normal blood lipids and cholesterol levels by exercising and minimizing your intake of saturated fat. Eat a balanced diet with plenty of fruit and vegetables. Blood tests for lipids and cholesterol should begin at age 620 and be repeated every 5 years. If your lipid or cholesterol levels are high, you are over 50, or you are at high risk for heart disease, you may need your cholesterol levels checked more frequently. Ongoing high lipid and cholesterol  levels should be treated with medicines if diet and exercise are not working.  . If you smoke, find out from your health care provider how to quit. If you do not use tobacco, please do not start.  . If you choose to drink alcohol, please do not consume more than 2 drinks per day. One drink is considered to be 12 ounces (355 mL) of beer, 5 ounces (148 mL) of wine, or 1.5 ounces (44 mL) of liquor.  . If you are 49-22 years old, ask your health care provider if you should take aspirin to prevent strokes.  . Use sunscreen.  Apply sunscreen liberally and repeatedly throughout the day. You should seek shade when your shadow is shorter than you. Protect yourself by wearing long sleeves, pants, a wide-brimmed hat, and sunglasses year round, whenever you are outdoors.  . Once a month, do a whole body skin exam, using a mirror to look at the skin on your back. Tell your health care provider of new moles, moles that have irregular borders, moles that are larger than a pencil eraser, or moles that have changed in shape or color.

## 2017-10-10 NOTE — Progress Notes (Signed)
Subjective:   Courtney Chandler is a 76 y.o. female who presents for Medicare Annual (Subsequent) preventive examination.  Review of Systems:  N/A Cardiac Risk Factors include: advanced age (>4455men, 77>65 women);dyslipidemia;hypertension     Objective:     Vitals: BP 122/76 (BP Location: Right Arm, Patient Position: Sitting, Cuff Size: Normal)   Pulse 76   Temp 97.9 F (36.6 C) (Oral)   Ht 5' (1.524 m) Comment: no shoes  Wt 119 lb 8 oz (54.2 kg)   SpO2 98%   BMI 23.34 kg/m   Body mass index is 23.34 kg/m.  Advanced Directives 10/10/2017 06/21/2017 06/21/2017 03/06/2016 02/07/2016  Does Patient Have a Medical Advance Directive? Yes Yes Yes No Yes  Type of Advance Directive Out of facility DNR (pink MOST or yellow form);Living will Out of facility DNR (pink MOST or yellow form) Out of facility DNR (pink MOST or yellow form) - -  Does patient want to make changes to medical advance directive? Yes (MAU/Ambulatory/Procedural Areas - Information given) No - Patient declined - - -  Copy of Healthcare Power of Attorney in Chart? - - - - No - copy requested  Would patient like information on creating a medical advance directive? - - - No - patient declined information No - patient declined information  Pre-existing out of facility DNR order (yellow form or pink MOST form) - Yellow form placed in chart (order not valid for inpatient use);Pink MOST form placed in chart (order not valid for inpatient use) - - -    Tobacco Social History   Tobacco Use  Smoking Status Former Smoker  Smokeless Tobacco Never Used  Tobacco Comment   quit 1981     Counseling given: No Comment: quit 1981   Clinical Intake:  Pre-visit preparation completed: Yes  Pain : 0-10 Pain Score: 4  Pain Type: Chronic pain Pain Location: Arm Pain Orientation: Left Pain Onset: More than a month ago Pain Frequency: Constant     Nutritional Status: BMI of 19-24  Normal Nutritional Risks: None Diabetes:  No  Activities of Daily Living: Independent Ambulation: Independent with device- listed below Home Assistive Devices/Equipment: Eyeglasses, CPAP, Brace (specify type) Medication Administration: Independent Home Management: Independent  Barriers to Care Management & Learning: None  Do you feel unsafe in your current relationship?: No Do you feel physically threatened by others?: No Anyone hurting you at home, work, or school?: No Unable to ask?: No Information provided on Community resources: No  How often do you need to have someone help you when you read instructions, pamphlets, or other written materials from your doctor or pharmacy?: 1 - Never What is the last grade level you completed in school?: Associate degree  Interpreter Needed?: No  Comments: pt lives alone Information entered by :: LPinson, LPN  Past Medical History:  Diagnosis Date  . Allergy   . Anxiety    associated with dyphagia  . Arthritis    osteo of left hip  . Asthma    moderate persistant  . 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 Deviated septum    2002  . Hypertension   . 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 Post-polio syndrome   . Scarlet fever   . Sleep apnea    severe - CPAP  . Wears dentures  lower partial   Past Surgical History:  Procedure Laterality Date  . APPENDECTOMY    . CATARACT EXTRACTION W/PHACO Left 02/07/2016   Procedure: CATARACT EXTRACTION PHACO AND INTRAOCULAR LENS PLACEMENT (IOC);  Surgeon: Lockie Molahadwick Brasington, MD;  [facility];  Service: Ophthalmology;  Laterality: Left;  CPAP  . CATARACT EXTRACTION W/PHACO Right 03/06/2016   Procedure: CATARACT EXTRACTION PHACO AND INTRAOCULAR LENS PLACEMENT (IOC);  Surgeon: Lockie Molahadwick Brasington, MD;  [facility];  Service: Ophthalmology;  Laterality: Right;  CPAP   . CHOLECYSTECTOMY    . COLONOSCOPY WITH ESOPHAGOGASTRODUODENOSCOPY (EGD)    . 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 NASAL SEPTUM SURGERY     Family History  Problem Relation Age of Onset  . Cancer Mother        lung  . Alcohol abuse Mother   . Diabetes Father   . Alcohol abuse Father   . Cancer Maternal Grandmother        colon  . Cancer Maternal Grandfather        colon  . Diabetes Paternal Grandmother    Social History   Socioeconomic History  . Marital status: Single    Spouse name: None  . Number of children: None  . Years of education: None  . Highest education level: None  Social Needs  . Financial resource strain: None  . Food insecurity - worry: None  . Food insecurity - inability: None  . Transportation needs - medical: None  . Transportation needs - non-medical: None  Occupational History  . Occupation: disabled    Comment: retired [employer]  Tobacco Use  . Smoking status: Former Games developermoker  . Smokeless tobacco: Never Used  . Tobacco comment: quit 1981  Substance and Sexual Activity  . Alcohol use: Yes    Comment: very rare wine (2x/yr)  . Drug use: No  . Sexual activity: None  Other Topics Concern  . None  Social History Narrative   Exercise -Lifting weights with arms, swimming       Diet healthy      End of Life: has completed MOST.. Wants to be DNR. (reviewed 2014)   Living will in place.    Outpatient Encounter Medications as of 10/10/2017  Medication Sig  . ACETAMINOPHEN PO Take 500 mg by mouth as needed.  Marland Kitchen. ADVAIR DISKUS 500-50 MCG/DOSE AEPB INHALE ONE DOSE BY MOUTH EVERY 12 HOURS  . ASPIRIN PO Take 81 mg by mouth daily.  . Calcium Carbonate-Vitamin D (CALTRATE 600+D) 600-400 MG-UNIT per tablet Take 1 tablet by mouth daily.    . cetirizine (ZYRTEC) 10 MG tablet Take 10 mg by mouth daily.  Marland Kitchen. Dextromethorphan-Guaifenesin 20-400 MG TABS Take by mouth 4 (four) times daily as needed.    . Esomeprazole Magnesium (NEXIUM PO) Take 20 mg by mouth daily.   .  fish oil-omega-3 fatty acids 1000 MG capsule Take 1 g by mouth 2 (two) times daily.  . Glucosamine-Chondroitin-MSM 500-400-300 MG TABS Take 1 tablet by mouth 2 (two) times daily.   Marland Kitchen. latanoprost (XALATAN) 0.005 % ophthalmic solution Place 1 drop into the left eye at bedtime.   . Melatonin 5 MG TABS Take 20-40 mg by mouth at bedtime.   . montelukast (SINGULAIR) 10 MG tablet TAKE ONE TABLET BY MOUTH AT BEDTIME  . Multiple Vitamins-Minerals (MULTIVITAMIN WITH MINERALS) tablet Take 1 tablet by mouth daily.  Marland Kitchen. nystatin (MYCOSTATIN) 100000 UNIT/ML suspension Take 5 mLs (500,000 Units total) by mouth 4 (four) times daily.  .Marland Kitchen  ondansetron (ZOFRAN) 8 MG tablet Take 1 tablet (8 mg total) by mouth every 8 (eight) hours as needed for nausea or vomiting.  Marland Kitchen oxyCODONE-acetaminophen (PERCOCET/ROXICET) 5-325 MG tablet Take 1 tablet by mouth every 8 (eight) hours as needed for moderate pain.  . potassium chloride SA (K-DUR,KLOR-CON) 20 MEQ tablet Take 1 tablet (20 mEq total) by mouth daily.  Marland Kitchen PROAIR HFA 108 (90 Base) MCG/ACT inhaler INHALE TWO PUFFS BY MOUTH 4 TIMES DAILY (Patient taking differently: INHALE TWO PUFFS BY MOUTH  as needed  every 4-6 hours as needed for wheeze)  . senna (SENOKOT) 8.6 MG tablet Take 3 tablets by mouth 2 (two) times daily.   Bernadette Hoit Sodium 8.6-50 MG CAPS Take 2 capsules by mouth daily.  . [DISCONTINUED] acetaminophen (TYLENOL) 325 MG tablet Take 325 mg by mouth daily as needed.  . [DISCONTINUED] aspirin 325 MG EC tablet Take 325 mg by mouth at bedtime as needed for pain.    No facility-administered encounter medications on file as of 10/10/2017.     Activities of Daily Living In your present state of health, do you have any difficulty performing the following activities: 10/10/2017 06/21/2017  Hearing? N N  Vision? N N  Difficulty concentrating or making decisions? N N  Walking or climbing stairs? Y N  Comment intermittent concerns  -  Dressing or bathing? N N   Doing errands, shopping? N N  Preparing Food and eating ? N -  Using the Toilet? N -  In the past six months, have you accidently leaked urine? N -  Do you have problems with loss of bowel control? N -  Managing your Medications? N -  Managing your Finances? N -  Housekeeping or managing your Housekeeping? N -  Some recent data might be hidden    Patient Care Team: Excell Seltzer, MD as PCP - General Lockie Mola, MD as Referring Physician (Ophthalmology)    Assessment:     Hearing Screening   125Hz  250Hz  500Hz  1000Hz  2000Hz  3000Hz  4000Hz  6000Hz  8000Hz   Right ear:   40 40 40  40    Left ear:   40 40 40  40    Vision Screening Comments: Last vision exam on January 09, 2017 with Dr. Inez Pilgrim   Exercise Activities and Dietary recommendations Current Exercise Habits: Home exercise routine, Type of exercise: walking, Time (Minutes): 20, Frequency (Times/Week): 7, Weekly Exercise (Minutes/Week): 140, Intensity: Mild, Exercise limited by: None identified  Goals    . Increase physical activity     Starting 10/10/2017, I will continue to walk at least 20 minutes daily.       Fall Risk Fall Risk  10/10/2017 10/18/2016 10/10/2016 10/09/2015 10/03/2014  Falls in the past year? No No No No No  Comment - Emmi Telephone Survey: data to providers prior to load - - -   Depression Screen PHQ 2/9 Scores 10/10/2017 10/10/2016 10/09/2015 10/03/2014  PHQ - 2 Score 0 0 0 0  PHQ- 9 Score 0 - - -     Cognitive Function MMSE - Mini Mental State Exam 10/10/2017  Orientation to time 5  Orientation to Place 5  Registration 3  Attention/ Calculation 0  Recall 3  Language- name 2 objects 0  Language- repeat 1  Language- follow 3 step command 3  Language- read & follow direction 0  Write a sentence 0  Copy design 0  Total score 20     PLEASE NOTE: A Mini-Cog screen was completed. Maximum score  is 20. A value of 0 denotes this part of Folstein MMSE was not completed or the patient  failed this part of the Mini-Cog screening.   Mini-Cog Screening Orientation to Time - Max 5 pts Orientation to Place - Max 5 pts Registration - Max 3 pts Recall - Max 3 pts Language Repeat - Max 1 pts Language Follow 3 Step Command - Max 3 pts     Immunization History  Administered Date(s) Administered  . Influenza Split 09/27/2011, 08/04/2012  . Influenza Whole 10/02/2007, 08/16/2008, 07/30/2010  . Influenza, High Dose Seasonal PF 08/26/2013  . Influenza,inj,Quad PF,6+ Mos 10/03/2014, 10/09/2015, 10/10/2016, 09/26/2017  . Pneumococcal Conjugate-13 10/03/2014  . Pneumococcal Polysaccharide-23 03/31/2008, 06/22/2017  . Td 10/02/2007, 10/10/2017  . Zoster 08/14/2010   Screening Tests Health Maintenance  Topic Date Due  . COLONOSCOPY  03/05/2025  . TETANUS/TDAP  10/11/2027  . INFLUENZA VACCINE  Completed  . DEXA SCAN  Completed  . PNA vac Low Risk Adult  Completed     Plan:     I have personally reviewed, addressed, and noted the following in the patient's chart:  A. Medical and social history B. Use of alcohol, tobacco or illicit drugs  C. Current medications and supplements D. Functional ability and status E.  Nutritional status F.  Physical activity G. Advance directives H. List of other physicians I.  Hospitalizations, surgeries, and ER visits in previous 12 months J.  Vitals K. Screenings to include hearing, vision, cognitive, depression L. Referrals and appointments - none  In addition, I have reviewed and discussed with patient certain preventive protocols, quality metrics, and best practice recommendations. A written personalized care plan for preventive services as well as general preventive health recommendations were provided to patient.  See attached scanned questionnaire for additional information.   Signed,   Randa Evens, MHA, BS, LPN Health Coach

## 2017-10-10 NOTE — Progress Notes (Signed)
Pre visit review using our clinic review tool, if applicable. No additional management support is needed unless otherwise documented below in the visit note. 

## 2017-10-10 NOTE — Progress Notes (Signed)
PCP notes:   Health maintenance:  Tetanus - vaccine administered; ABN signed with pt accepting financial responsibility  Abnormal screenings:   None  Patient concerns:   None  Nurse concerns:  None  Next PCP appt:   10/14/17 @ 1000

## 2017-10-13 NOTE — Progress Notes (Signed)
I reviewed health advisor's note, was available for consultation, and agree with documentation and plan.  

## 2017-10-14 ENCOUNTER — Ambulatory Visit (INDEPENDENT_AMBULATORY_CARE_PROVIDER_SITE_OTHER): Payer: Medicare Other | Admitting: Family Medicine

## 2017-10-14 ENCOUNTER — Encounter: Payer: Self-pay | Admitting: Family Medicine

## 2017-10-14 VITALS — BP 140/80 | HR 76 | Temp 97.7°F | Ht 60.0 in | Wt 119.5 lb

## 2017-10-14 DIAGNOSIS — B91 Sequelae of poliomyelitis: Secondary | ICD-10-CM | POA: Diagnosis not present

## 2017-10-14 DIAGNOSIS — E7800 Pure hypercholesterolemia, unspecified: Secondary | ICD-10-CM | POA: Diagnosis not present

## 2017-10-14 DIAGNOSIS — Z8719 Personal history of other diseases of the digestive system: Secondary | ICD-10-CM | POA: Diagnosis not present

## 2017-10-14 DIAGNOSIS — Z0000 Encounter for general adult medical examination without abnormal findings: Secondary | ICD-10-CM | POA: Diagnosis not present

## 2017-10-14 DIAGNOSIS — M25512 Pain in left shoulder: Secondary | ICD-10-CM | POA: Diagnosis not present

## 2017-10-14 DIAGNOSIS — M8589 Other specified disorders of bone density and structure, multiple sites: Secondary | ICD-10-CM

## 2017-10-14 DIAGNOSIS — J4540 Moderate persistent asthma, uncomplicated: Secondary | ICD-10-CM | POA: Diagnosis not present

## 2017-10-14 DIAGNOSIS — M21371 Foot drop, right foot: Secondary | ICD-10-CM | POA: Insufficient documentation

## 2017-10-14 DIAGNOSIS — Z1231 Encounter for screening mammogram for malignant neoplasm of breast: Secondary | ICD-10-CM

## 2017-10-14 DIAGNOSIS — I10 Essential (primary) hypertension: Secondary | ICD-10-CM

## 2017-10-14 NOTE — Patient Instructions (Signed)
Please stop at the front desk to set up referral.  

## 2017-10-14 NOTE — Assessment & Plan Note (Signed)
Improving. Not interested in further eval at this time.

## 2017-10-14 NOTE — Assessment & Plan Note (Signed)
Stable control. 

## 2017-10-14 NOTE — Progress Notes (Signed)
Subjective:    Patient ID: Courtney Chandler, female    DOB: 06/21/1941, 76 y.o.   MRN: [id number]  HPI  The patient presents for complete physical and review of chronic health problems. He/She also has the following acute concerns today:  Left shoulder pain x several months, using tyelnol, mobility slightly decreased. No fall, no change in activity. No numbness or weakness, no neck pain.  The patient saw Lu Duffelarlesia Pinson, LPN for medicare wellness. Note reviewed in detail and important notes copied below.  Health maintenance: Tetanus - vaccine administered; ABN signed with pt accepting financial responsibility Abnormal screenings:  None  10/14/17 Today  Asthma, moderate: Stable control on  high dose advair, singulair  Esophagitis per GI. ENDO 2015: on nexium.  Hypertension:    Good control at home. BP Readings from Last 3 Encounters:  10/14/17 140/80  10/10/17 122/76  06/27/17 (!) 142/80  Using medication without problems or lightheadedness:  none Chest pain with exertion: none Edema: none Short of breath: none Average home BPs: good Other issues:  Elevated Cholesterol: LDL at goal < 130 on fish oil but has worsened in last 3 months given change in diet with pancreatitis ( was eating more fat). Has lost 15 lbs in last 3 months.  Red yeast rice causes constipation.  statin meds cause pancreatitis Lab Results  Component Value Date   CHOL 218 (H) 10/10/2017   HDL 74.30 10/10/2017   LDLCALC 114 (H) 10/10/2017   LDLDIRECT 168.0 09/24/2013   TRIG 146.0 10/10/2017   CHOLHDL 3 10/10/2017  Diet compliance: good Exercise: walking,  Other complaints:  Hiatal hernia, dysmotiity, chronic .Marland Kitchen. She reports choking and swallowing is much better. She feels it is better with improvement in polio. 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 She is feeling better with thinking and energy as well. She has had  strengthening in muscles and no longer needs her leg brace as much.  She has noted more foot drop and weakening in right lower leg.  She has been doing an exercise.  She does have atrophy in right leg.. AFO brace does not fit as well anymore. Social History /Family History/Past Medical History reviewed in detail and updated in EMR if needed. Vitals:   10/14/17 1009  BP: 140/80  Pulse: 76  Temp: 97.7 F (36.5 C)  SpO2: 96%   Wt Readings from Last 3 Encounters:  10/14/17 119 lb 8 oz (54.2 kg)  10/10/17 119 lb 8 oz (54.2 kg)  06/27/17 130 lb (59 kg)   Body mass index is 23.34 kg/m.  Review of Systems  Constitutional: Negative for fatigue and fever.  HENT: Negative for congestion.   Eyes: Negative for pain.  Respiratory: Negative for cough and shortness of breath.   Cardiovascular: Negative for chest pain, palpitations and leg swelling.  Gastrointestinal: Negative for abdominal pain.  Genitourinary: Negative for dysuria and vaginal bleeding.  Musculoskeletal: Negative for back pain.  Neurological: Negative for syncope, light-headedness and headaches.  Psychiatric/Behavioral: Negative for dysphoric mood.       Objective:   Physical Exam  Constitutional: Vital signs are normal. She appears well-developed and well-nourished. She is cooperative.  Non-toxic appearance. She does not appear ill. No distress.  HENT:  Head: Normocephalic.  Right Ear: Hearing, tympanic membrane, external ear and ear canal normal.  Left Ear: Hearing, tympanic membrane, external ear and ear canal normal.  Nose: Nose normal.  Eyes: Conjunctivae, EOM  and lids are normal. Pupils are equal, round, and reactive to light. Lids are everted and swept, no foreign bodies found.  Neck: Trachea normal and normal range of motion. Neck supple. Carotid bruit is not present. No thyroid mass and no thyromegaly present.  Cardiovascular: Normal rate, regular rhythm, S1 normal, S2 normal, normal heart sounds and intact  distal pulses. Exam reveals no gallop.  No murmur heard. Pulmonary/Chest: Effort normal and breath sounds normal. No respiratory distress. She has no wheezes. She has no rhonchi. She has no rales.  Abdominal: Soft. Normal appearance and bowel sounds are normal. She exhibits no distension, no fluid wave, no abdominal bruit and no mass. There is no hepatosplenomegaly. There is no tenderness. There is no rebound, no guarding and no CVA tenderness. No hernia.  Lymphadenopathy:    She has no cervical adenopathy.    She has no axillary adenopathy.  Neurological: She is alert. She has normal strength. She displays atrophy. No cranial nerve deficit or sensory deficit. She exhibits abnormal muscle tone.   Right foot drop, weakness and atrophy of right distal leg  Skin: Skin is warm, dry and intact. No rash noted.  Psychiatric: Her speech is normal and behavior is normal. Judgment normal. Her mood appears not anxious. Cognition and memory are normal. She does not exhibit a depressed mood.          Assessment & Plan:  The patient's preventative maintenance and recommended screening tests for an annual wellness exam were reviewed in full today. Brought up to date unless services declined.  Counselled on the importance of diet, exercise, and its role in overall health and mortality. The patient's FH and SH was reviewed, including their home life, tobacco status, and drug and alcohol status.   Vaccines:Uptodate PNA, prevnar,,shingles,  Flu.  Pap/DVE:  Not indicated Mammo: 12/2014 normal, repeat in 2 years (2018) Bone Density:2013 osteopenia. Vit D Recheck in 5 years. Colon: Colonoscopy: 02/2015 hemorrhoids are cause of positive IFOB, repeat colonoscopy  not indicated. Smoking Status: none ETOH/ drug use: none She has advance directives

## 2017-10-14 NOTE — Assessment & Plan Note (Signed)
Get back on track with diet.. Will follow over time.

## 2017-10-14 NOTE — Assessment & Plan Note (Addendum)
Needs new AFO brace.. Will look into referral.

## 2017-10-14 NOTE — Assessment & Plan Note (Signed)
Well controlled. Continue current medication.  

## 2017-10-14 NOTE — Assessment & Plan Note (Signed)
Resolved

## 2017-10-17 DIAGNOSIS — Z780 Asymptomatic menopausal state: Secondary | ICD-10-CM | POA: Diagnosis not present

## 2017-10-17 DIAGNOSIS — Z1231 Encounter for screening mammogram for malignant neoplasm of breast: Secondary | ICD-10-CM | POA: Diagnosis not present

## 2017-10-17 DIAGNOSIS — M8588 Other specified disorders of bone density and structure, other site: Secondary | ICD-10-CM | POA: Diagnosis not present

## 2017-10-17 DIAGNOSIS — M810 Age-related osteoporosis without current pathological fracture: Secondary | ICD-10-CM | POA: Diagnosis not present

## 2017-10-17 DIAGNOSIS — M85852 Other specified disorders of bone density and structure, left thigh: Secondary | ICD-10-CM | POA: Diagnosis not present

## 2017-10-17 LAB — HM DEXA SCAN: HM Dexa Scan: NORMAL

## 2017-10-18 ENCOUNTER — Other Ambulatory Visit: Payer: Self-pay | Admitting: Family Medicine

## 2017-10-28 ENCOUNTER — Telehealth: Payer: Self-pay | Admitting: *Deleted

## 2017-10-28 NOTE — Telephone Encounter (Signed)
Copied from CRM [id number]. Topic: Inquiry >> Oct 28, 2017 12:19 PM Landry MellowFoltz, Melissa J wrote: Reason for CRM: pt called she needs to get a new AFO, because her old ones doesn't fit.  For the right leg.  Cb # is [phone]

## 2017-10-28 NOTE — Telephone Encounter (Signed)
Left message for Mrs. Courtney Chandler that her bone density showed some areas increased (Lumbar spine) and some areas decreased (left proximal femur) since recent and baseline measurements.  Advised to continue with Calcium, Vitamin D and exercise as able per Dr. Ermalene SearingBedsole.

## 2017-10-28 NOTE — Telephone Encounter (Signed)
We discussed this at her last OV... What do I need to do to get AFO fitting set up?  Is referral as entered correct? Or other.  I had written her an rx at last appt but this obviously was not correct.

## 2017-10-29 NOTE — Telephone Encounter (Signed)
Spoke with Ms. Esther HardyGotzens and advised order for AFO brace was faxed to [facility] in [place] and she would just need to call them to set up a time to be fitted.  She states she wants to go to [facility] in [place].  Order for right AFO Brace faxed to [facility] at [phone] as requested by Ms. Tellefsen.

## 2018-02-09 DIAGNOSIS — H40001 Preglaucoma, unspecified, right eye: Secondary | ICD-10-CM | POA: Diagnosis not present

## 2018-02-17 DIAGNOSIS — H401121 Primary open-angle glaucoma, left eye, mild stage: Secondary | ICD-10-CM | POA: Diagnosis not present

## 2018-06-12 ENCOUNTER — Telehealth: Payer: Self-pay | Admitting: *Deleted

## 2018-06-12 DIAGNOSIS — G4733 Obstructive sleep apnea (adult) (pediatric): Secondary | ICD-10-CM

## 2018-06-12 NOTE — Telephone Encounter (Addendum)
Spoke with Mrs. Courtney Chandler.  She states she needs orders faxed to [facility] for CPAP supplies:  Head Gear Mask PAP Nasal Comfort Gel Blue Small Tubing S9 Climate Control 6 feet Reservoir  DME order entered in Epic for CPAP supplies and [facility] [phone].

## 2018-06-12 NOTE — Telephone Encounter (Signed)
There was also an order form in Dr. Daphine DeutscherBedsole's in box to be signed for CPAP supplies.  Form signatured stamped and [facility] at [phone].

## 2018-06-12 NOTE — Telephone Encounter (Signed)
Copied from CRM [phone]. Topic: General - Other >> Jun 12, 2018 10:43 AM Tamela OddiHarris, Brenda J wrote: Reason for CRM: Patient called to get the status of a request order for CP Supplies from [facility].  Their fax # is [phone].  Patient's CB# [phone].

## 2018-08-17 DIAGNOSIS — H401121 Primary open-angle glaucoma, left eye, mild stage: Secondary | ICD-10-CM | POA: Diagnosis not present

## 2018-09-24 ENCOUNTER — Ambulatory Visit (INDEPENDENT_AMBULATORY_CARE_PROVIDER_SITE_OTHER): Payer: Medicare Other | Admitting: Family Medicine

## 2018-09-24 DIAGNOSIS — Z23 Encounter for immunization: Secondary | ICD-10-CM

## 2018-09-28 ENCOUNTER — Other Ambulatory Visit: Payer: Self-pay

## 2018-10-12 ENCOUNTER — Other Ambulatory Visit: Payer: Medicare Other

## 2018-10-16 ENCOUNTER — Ambulatory Visit: Payer: Medicare Other | Admitting: Family Medicine

## 2018-10-19 ENCOUNTER — Telehealth: Payer: Self-pay | Admitting: Family Medicine

## 2018-10-19 NOTE — Telephone Encounter (Signed)
Pt called back and spoke with destiny.  Pt refused to schedule medicare wellness appointment

## 2018-10-19 NOTE — Telephone Encounter (Signed)
Tried calling pt.  If pt calls back please see if pt can come in prior to her appointment on 12/13 to see lisa p for medicare wellness and fasting labs

## 2018-10-19 NOTE — Telephone Encounter (Signed)
Noted  

## 2018-10-23 ENCOUNTER — Ambulatory Visit (INDEPENDENT_AMBULATORY_CARE_PROVIDER_SITE_OTHER): Payer: Medicare Other | Admitting: Family Medicine

## 2018-10-23 ENCOUNTER — Encounter: Payer: Self-pay | Admitting: *Deleted

## 2018-10-23 ENCOUNTER — Encounter: Payer: Self-pay | Admitting: Family Medicine

## 2018-10-23 VITALS — BP 146/64 | HR 80 | Temp 97.5°F | Ht 60.0 in | Wt 117.8 lb

## 2018-10-23 DIAGNOSIS — I10 Essential (primary) hypertension: Secondary | ICD-10-CM | POA: Diagnosis not present

## 2018-10-23 DIAGNOSIS — J4540 Moderate persistent asthma, uncomplicated: Secondary | ICD-10-CM

## 2018-10-23 DIAGNOSIS — B91 Sequelae of poliomyelitis: Secondary | ICD-10-CM | POA: Diagnosis not present

## 2018-10-23 DIAGNOSIS — E559 Vitamin D deficiency, unspecified: Secondary | ICD-10-CM | POA: Diagnosis not present

## 2018-10-23 DIAGNOSIS — E7800 Pure hypercholesterolemia, unspecified: Secondary | ICD-10-CM | POA: Diagnosis not present

## 2018-10-23 LAB — COMPREHENSIVE METABOLIC PANEL
ALBUMIN: 4.3 g/dL (ref 3.5–5.2)
ALK PHOS: 55 U/L (ref 39–117)
ALT: 15 U/L (ref 0–35)
AST: 18 U/L (ref 0–37)
BUN: 12 mg/dL (ref 6–23)
CALCIUM: 9.9 mg/dL (ref 8.4–10.5)
CO2: 25 meq/L (ref 19–32)
Chloride: 105 mEq/L (ref 96–112)
Creatinine, Ser: 0.74 mg/dL (ref 0.40–1.20)
GFR: 81 mL/min (ref >60.00)
Glucose, Bld: 89 mg/dL (ref 70–99)
Potassium: 3.3 mEq/L — ABNORMAL LOW (ref 3.5–5.1)
SODIUM: 140 meq/L (ref 135–145)
TOTAL PROTEIN: 7.5 g/dL (ref 6.0–8.3)
Total Bilirubin: 0.6 mg/dL (ref 0.2–1.2)

## 2018-10-23 LAB — LIPID PANEL
CHOLESTEROL: 212 mg/dL — AB (ref 0–200)
HDL: 76.8 mg/dL (ref >39.00)
LDL Cholesterol: 103 mg/dL — ABNORMAL HIGH (ref 0–99)
NonHDL: 134.75
TRIGLYCERIDES: 157 mg/dL — AB (ref 0.0–149.0)
Total CHOL/HDL Ratio: 3
VLDL: 31.4 mg/dL (ref 0.0–40.0)

## 2018-10-23 LAB — VITAMIN D 25 HYDROXY (VIT D DEFICIENCY, FRACTURES): VITD: 36.63 ng/mL (ref 30.00–100.00)

## 2018-10-23 NOTE — Progress Notes (Signed)
Subjective:    Patient ID: Courtney Chandler, female    DOB: 1941/07/03, 77 y.o.   MRN: [id number]  HPI 77 year old present for an annual  Follow up.   Given  issues with billing last year she does not want a medicare wellness.   Asthma, moderate:Stable control on high dose advair, singulair  She has been exposed to smoke from her apartment neighbors.. so it is worse lately.  Hypertension:    Elevated in office but pt upset with billing. Well controlled at home. BP Readings from Last 3 Encounters:  10/23/18 (!) 146/64  10/14/17 140/80  10/10/17 122/76  Using medication without problems or lightheadedness:  Chest pain with exertion: none Edema:none Short of breath: none Average home BPs: 111/68 Other issues:  Elevated Cholesterol:  Due for re-eval. Lab Results  Component Value Date   CHOL 218 (H) 10/10/2017   HDL 74.30 10/10/2017   LDLCALC 114 (H) 10/10/2017   LDLDIRECT 168.0 09/24/2013   TRIG 146.0 10/10/2017   CHOLHDL 3 10/10/2017   Using medications without problems: Muscle aches:  Diet compliance: good Exercise: walking some, hving some trouble getting up the stairs. Other complaints:  Hiatal hernia, dysmotiity, chronic .Marland Kitchen She reports choking and swallowing is much better.  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 has improved with CBD oil. She has had strengthening in muscles and no longer needs her leg brace as much. Has to use when walking long distances.  She had noted more foot drop and weakening in right lower leg.   She does have atrophy in right leg.Marland Kitchen  HAs new AFO brace fitting well.  Social History /Family History/Past Medical History reviewed in detail and updated in EMR if needed.      Office Visit from 10/23/2018 in [facility] Total Score  0       Social History /Family History/Past Medical History reviewed in detail and updated in EMR if needed. Blood pressure (!) 146/64,  pulse 80, temperature (!) 97.5 F (36.4 C), temperature source Oral, height 5' (1.524 m), weight 117 lb 12 oz (53.4 kg), SpO2 98 %.   Body mass index is 23 kg/m.  Review of Systems     Objective:   Physical Exam Constitutional:      General: She is not in acute distress.    Appearance: Normal appearance. She is well-developed. She is not ill-appearing or toxic-appearing.  HENT:     Head: Normocephalic.     Right Ear: Hearing, tympanic membrane, ear canal and external ear normal.     Left Ear: Hearing, tympanic membrane, ear canal and external ear normal.     Nose: Nose normal.  Eyes:     General: Lids are normal. Lids are everted, no foreign bodies appreciated.     Conjunctiva/sclera: Conjunctivae normal.     Pupils: Pupils are equal, round, and reactive to light.  Neck:     Musculoskeletal: Normal range of motion and neck supple.     Thyroid: No thyroid mass or thyromegaly.     Vascular: No carotid bruit.     Trachea: Trachea normal.  Cardiovascular:     Rate and Rhythm: Normal rate and regular rhythm.     Heart sounds: Normal heart sounds, S1 normal and S2 normal. No murmur. No gallop.   Pulmonary:     Effort: Pulmonary effort is normal. No respiratory distress.     Breath sounds: Normal breath sounds. No wheezing,  rhonchi or rales.  Abdominal:     General: Bowel sounds are normal. There is no distension or abdominal bruit.     Palpations: Abdomen is soft. There is no fluid wave or mass.     Tenderness: There is no abdominal tenderness. There is no guarding or rebound.     Hernia: No hernia is present.  Lymphadenopathy:     Cervical: No cervical adenopathy.  Skin:    General: Skin is warm and dry.     Findings: No rash.  Neurological:     Mental Status: She is alert.     Cranial Nerves: No cranial nerve deficit.     Sensory: No sensory deficit.     Motor: Atrophy and abnormal muscle tone present.     Comments:  Right foot drop, weakness and atrophy of right distal  leg  Psychiatric:        Mood and Affect: Mood is not anxious or depressed.        Speech: Speech normal.        Behavior: Behavior normal. Behavior is cooperative.        Judgment: Judgment normal.           Assessment & Plan:  The patient's preventative maintenance and recommended screening tests for an annual wellness exam were reviewed in full today. Brought up to date unless services declined.  Counselled on the importance of diet, exercise, and its role in overall health and mortality. The patient's FH and SH was reviewed, including their home life, tobacco status, and drug and alcohol status.   Vaccines:Uptodate PNA, prevnar, given  Flu in 09/2018.. considering shingrix but has had zostavax. Pap/DVE:Not indicated Mammo: 2018 nml repeat q2 years Bone Density:2018 osteopenia. Vit D Recheck in 5 years. Colon:Colonoscopy: 02/2015 hemorrhoids are cause of positive IFOB, repeat colonoscopy not indicated. Smoking Status:none ETOH/ drug ZOX:WRUEuse:none She has advance directives

## 2018-10-23 NOTE — Patient Instructions (Signed)
Work on exercise as tolerate, keep up the healthy eating.  Please stop at the lab to have labs drawn.

## 2018-10-26 ENCOUNTER — Other Ambulatory Visit: Payer: Self-pay | Admitting: Family Medicine

## 2018-10-29 ENCOUNTER — Other Ambulatory Visit: Payer: Self-pay | Admitting: Family Medicine

## 2018-11-16 ENCOUNTER — Other Ambulatory Visit: Payer: Self-pay | Admitting: Family Medicine

## 2018-11-16 MED ORDER — PROAIR HFA 108 (90 BASE) MCG/ACT IN AERS: 2.0000 | INHALATION_SPRAY | Freq: Four times a day (QID) | RESPIRATORY_TRACT | 11 refills | Status: DC | PRN

## 2018-11-16 NOTE — Addendum Note (Signed)
Addended by: Damita Lack on: 11/16/2018 11:44 AM   Modules accepted: Orders

## 2018-11-16 NOTE — Telephone Encounter (Signed)
Received fax from [facility] asking to resend in Rx for ProAir.  Insurance preference.  Rx sent as requested.

## 2019-01-08 ENCOUNTER — Telehealth: Payer: Self-pay

## 2019-01-08 NOTE — Telephone Encounter (Signed)
Pt wants to know if could get generic Advair 2 1/2 wks early; pt already has refills available; pt will ck with [facility] [address] to see if advair refill will go thru otherwise pt would have to pay out of pocket. Pt is concerned due to the problems in [place] causing shortage of some meds. Pt will ck with [facility]; nothing further needed.

## 2019-04-29 ENCOUNTER — Telehealth: Payer: Self-pay

## 2019-04-29 NOTE — Telephone Encounter (Signed)
Okay to write note as requested.. forward to me for review.

## 2019-04-29 NOTE — Telephone Encounter (Signed)
I do not see an office visit that states patient uses her CPAP machine every night.  The only thing I see mentioned is from 09/2016 CPE that states patient uses CPAP for sleep apnea.  I also do no see any sleep study results which [facility] will also need.  They will also require CPAP setting which is not documented in chart.

## 2019-04-29 NOTE — Telephone Encounter (Signed)
Noted  

## 2019-04-29 NOTE — Telephone Encounter (Addendum)
Pt called to say her CPAP machine has a leak. It is not working well and is 77 years old. The supply company needs an OV note that states she uses her CPAP machine every night. When it is running, she fully benefits from it. Current machine has stopped working. Pressures need to be pulled from old machine.  Pt is pretty sure she discussed her Sleep Apnea in a recent OV.   Fax order and note to [facility] ([facility]) [phone].

## 2019-04-29 NOTE — Telephone Encounter (Signed)
Pt left v/m that pt had Cpap machine checked and is working now and request order for cpap from Dr Diona Browner cancelled. FYI to [facility] CMA & Dr Diona Browner.

## 2019-08-05 ENCOUNTER — Ambulatory Visit (INDEPENDENT_AMBULATORY_CARE_PROVIDER_SITE_OTHER): Payer: Medicare Other

## 2019-08-05 DIAGNOSIS — Z23 Encounter for immunization: Secondary | ICD-10-CM | POA: Diagnosis not present

## 2019-10-06 ENCOUNTER — Other Ambulatory Visit: Payer: Self-pay

## 2019-10-18 ENCOUNTER — Other Ambulatory Visit: Payer: Self-pay | Admitting: Family Medicine

## 2019-10-19 ENCOUNTER — Telehealth: Payer: Self-pay | Admitting: Family Medicine

## 2019-10-19 DIAGNOSIS — E559 Vitamin D deficiency, unspecified: Secondary | ICD-10-CM

## 2019-10-19 DIAGNOSIS — E7800 Pure hypercholesterolemia, unspecified: Secondary | ICD-10-CM

## 2019-10-19 DIAGNOSIS — M8589 Other specified disorders of bone density and structure, multiple sites: Secondary | ICD-10-CM

## 2019-10-19 NOTE — Telephone Encounter (Signed)
-----   Message from Ellamae Sia sent at 10/14/2019 12:10 PM EST ----- Regarding: lab orders for Thursday, 12.10.20 Patient is scheduled for CPX labs, please order future labs, Thanks , Karna Christmas

## 2019-10-21 ENCOUNTER — Other Ambulatory Visit (INDEPENDENT_AMBULATORY_CARE_PROVIDER_SITE_OTHER): Payer: Medicare Other

## 2019-10-21 ENCOUNTER — Other Ambulatory Visit: Payer: Self-pay

## 2019-10-21 DIAGNOSIS — E559 Vitamin D deficiency, unspecified: Secondary | ICD-10-CM | POA: Diagnosis not present

## 2019-10-21 DIAGNOSIS — E7800 Pure hypercholesterolemia, unspecified: Secondary | ICD-10-CM

## 2019-10-21 LAB — LIPID PANEL
Cholesterol: 234 mg/dL — ABNORMAL HIGH (ref 0–200)
HDL: 76.7 mg/dL (ref >39.00)
NonHDL: 157.49
Total CHOL/HDL Ratio: 3
Triglycerides: 224 mg/dL — ABNORMAL HIGH (ref 0.0–149.0)
VLDL: 44.8 mg/dL — ABNORMAL HIGH (ref 0.0–40.0)

## 2019-10-21 LAB — COMPREHENSIVE METABOLIC PANEL
ALT: 11 U/L (ref 0–35)
AST: 17 U/L (ref 0–37)
Albumin: 4.1 g/dL (ref 3.5–5.2)
Alkaline Phosphatase: 53 U/L (ref 39–117)
BUN: 10 mg/dL (ref 6–23)
CO2: 27 mEq/L (ref 19–32)
Calcium: 9.8 mg/dL (ref 8.4–10.5)
Chloride: 106 mEq/L (ref 96–112)
Creatinine, Ser: 0.83 mg/dL (ref 0.40–1.20)
GFR: 66.58 mL/min (ref >60.00)
Glucose, Bld: 81 mg/dL (ref 70–99)
Potassium: 3.8 mEq/L (ref 3.5–5.1)
Sodium: 142 mEq/L (ref 135–145)
Total Bilirubin: 0.6 mg/dL (ref 0.2–1.2)
Total Protein: 7.4 g/dL (ref 6.0–8.3)

## 2019-10-21 LAB — LDL CHOLESTEROL, DIRECT: Direct LDL: 99 mg/dL

## 2019-10-21 LAB — VITAMIN D 25 HYDROXY (VIT D DEFICIENCY, FRACTURES): VITD: 37.68 ng/mL (ref 30.00–100.00)

## 2019-10-21 NOTE — Progress Notes (Signed)
No critical labs need to be addressed urgently. We will discuss labs in detail at upcoming office visit.   

## 2019-10-26 ENCOUNTER — Other Ambulatory Visit: Payer: Self-pay

## 2019-10-26 ENCOUNTER — Encounter: Payer: Self-pay | Admitting: Family Medicine

## 2019-10-26 ENCOUNTER — Ambulatory Visit (INDEPENDENT_AMBULATORY_CARE_PROVIDER_SITE_OTHER): Payer: Medicare Other | Admitting: Family Medicine

## 2019-10-26 VITALS — BP 150/70 | HR 94 | Temp 98.0°F | Ht 60.25 in | Wt 120.2 lb

## 2019-10-26 DIAGNOSIS — Z0000 Encounter for general adult medical examination without abnormal findings: Secondary | ICD-10-CM

## 2019-10-26 DIAGNOSIS — J4540 Moderate persistent asthma, uncomplicated: Secondary | ICD-10-CM | POA: Diagnosis not present

## 2019-10-26 DIAGNOSIS — G4733 Obstructive sleep apnea (adult) (pediatric): Secondary | ICD-10-CM

## 2019-10-26 DIAGNOSIS — E7800 Pure hypercholesterolemia, unspecified: Secondary | ICD-10-CM | POA: Diagnosis not present

## 2019-10-26 DIAGNOSIS — I10 Essential (primary) hypertension: Secondary | ICD-10-CM | POA: Diagnosis not present

## 2019-10-26 DIAGNOSIS — B91 Sequelae of poliomyelitis: Secondary | ICD-10-CM

## 2019-10-26 NOTE — Patient Instructions (Addendum)
Call to schedule mammogram when able. Keep working on healthy eating and regular cardiovascular exercise including leg strengthening exercises!  Consider shingles vaccine: [facility]   Preventive Care 77 Years and Older, Female Preventive care refers to lifestyle choices and visits with your health care provider that can promote health and wellness. This includes:  A yearly physical exam. This is also called an annual well check.  Regular dental and eye exams.  Immunizations.  Screening for certain conditions.  Healthy lifestyle choices, such as diet and exercise. What can I expect for my preventive care visit? Physical exam Your health care provider will check:  Height and weight. These may be used to calculate body mass index (BMI), which is a measurement that tells if you are at a healthy weight.  Heart rate and blood pressure.  Your skin for abnormal spots. Counseling Your health care provider may ask you questions about:  Alcohol, tobacco, and drug use.  Emotional well-being.  Home and relationship well-being.  Sexual activity.  Eating habits.  History of falls.  Memory and ability to understand (cognition).  Work and work Statistician.  Pregnancy and menstrual history. What immunizations do I need?  Influenza (flu) vaccine  This is recommended every year. Tetanus, diphtheria, and pertussis (Tdap) vaccine  You may need a Td booster every 10 years. Varicella (chickenpox) vaccine  You may need this vaccine if you have not already been vaccinated. Zoster (shingles) vaccine  You may need this after age 77. Pneumococcal conjugate (PCV13) vaccine  One dose is recommended after age 77. Pneumococcal polysaccharide (PPSV23) vaccine  One dose is recommended after age 77. Measles, mumps, and rubella (MMR) vaccine  You may need at least one dose of MMR if you were born in 1957 or later. You may also need a second dose. Meningococcal conjugate (MenACWY)  vaccine  You may need this if you have certain conditions. Hepatitis A vaccine  You may need this if you have certain conditions or if you travel or work in places where you may be exposed to hepatitis A. Hepatitis B vaccine  You may need this if you have certain conditions or if you travel or work in places where you may be exposed to hepatitis B. Haemophilus influenzae type b (Hib) vaccine  You may need this if you have certain conditions. You may receive vaccines as individual doses or as more than one vaccine together in one shot (combination vaccines). Talk with your health care provider about the risks and benefits of combination vaccines. What tests do I need? Blood tests  Lipid and cholesterol levels. These may be checked every 5 years, or more frequently depending on your overall health.  Hepatitis C test.  Hepatitis B test. Screening  Lung cancer screening. You may have this screening every year starting at age 77 if you have a 30-pack-year history of smoking and currently smoke or have quit within the past 15 years.  Colorectal cancer screening. All adults should have this screening starting at age 77 and continuing until age 15. Your health care provider may recommend screening at age 90 if you are at increased risk. You will have tests every 1-10 years, depending on your results and the type of screening test.  Diabetes screening. This is done by checking your blood sugar (glucose) after you have not eaten for a while (fasting). You may have this done every 1-3 years.  Mammogram. This may be done every 1-2 years. Talk with your health care provider about how often  you should have regular mammograms.  BRCA-related cancer screening. This may be done if you have a family history of breast, ovarian, tubal, or peritoneal cancers. Other tests  Sexually transmitted disease (STD) testing.  Bone density scan. This is done to screen for osteoporosis. You may have this done  starting at age 20. Follow these instructions at home: Eating and drinking  Eat a diet that includes fresh fruits and vegetables, whole grains, lean protein, and low-fat dairy products. Limit your intake of foods with high amounts of sugar, saturated fats, and salt.  Take vitamin and mineral supplements as recommended by your health care provider.  Do not drink alcohol if your health care provider tells you not to drink.  If you drink alcohol: ? Limit how much you have to 0-1 drink a day. ? Be aware of how much alcohol is in your drink. In the [place] one drink equals one 12 oz bottle of beer (355 mL), one 5 oz glass of wine (148 mL), or one 1 oz glass of hard liquor (44 mL). Lifestyle  Take daily care of your teeth and gums.  Stay active. Exercise for at least 30 minutes on 5 or more days each week.  Do not use any products that contain nicotine or tobacco, such as cigarettes, e-cigarettes, and chewing tobacco. If you need help quitting, ask your health care provider.  If you are sexually active, practice safe sex. Use a condom or other form of protection in order to prevent STIs (sexually transmitted infections).  Talk with your health care provider about taking a low-dose aspirin or statin. What's next?  Go to your health care provider once a year for a well check visit.  Ask your health care provider how often you should have your eyes and teeth checked.  Stay up to date on all vaccines. This information is not intended to replace advice given to you by your health care provider. Make sure you discuss any questions you have with your health care provider. Document Released: 11/24/2015 Document Revised: 10/22/2018 Document Reviewed: 10/22/2018 Elsevier Patient Education  2020 [employer].

## 2019-10-26 NOTE — Assessment & Plan Note (Signed)
ON CPAP

## 2019-10-26 NOTE — Assessment & Plan Note (Signed)
Medication not indicated. Work on low [employer].

## 2019-10-26 NOTE — Assessment & Plan Note (Signed)
Stable control. 

## 2019-10-26 NOTE — Assessment & Plan Note (Signed)
Encouraged strengthening exercises as able. Uses brace.

## 2019-10-26 NOTE — Progress Notes (Signed)
Chief Complaint  Patient presents with  . Medicare Wellness    History of Present Illness: HPI  The patient presents for annual medicare wellness, complete physical and review of chronic health problems. He/She also has the following acute concerns today:  I have personally reviewed the Medicare Annual Wellness questionnaire and have noted 1. The patient's medical and social history 2. Their use of alcohol, tobacco or illicit drugs 3. Their current medications and supplements 4. The patient's functional ability including ADL's, fall risks, home safety risks and hearing or visual             impairment. 5. Diet and physical activities 6. Evidence for depression or mood disorders 7.         Updated provider list Cognitive evaluation was performed and recorded on pt medicare questionnaire form. The patients weight, height, BMI and visual acuity have been recorded in the chart  I have made referrals, counseling and provided education to the patient based review of the above and I have provided the pt with a written personalized care plan for preventive services.   Documentation of this information was scanned into the electronic record under the media tab.   Advance directives and end of life planning reviewed in detail with patient and documented in EMR. Patient given handout on advance care directives if needed. HCPOA and living will updated if needed.    Office Visit from 10/26/2019 in [facility] [place]  PHQ-2 Total Score  0      Fall Risk  10/26/2019 10/23/2018 09/28/2018 10/10/2017 10/18/2016  Falls in the past year? 0 0 0 No No  Comment - - Emmi Telephone Survey: data to providers prior to load - Emmi Telephone Survey: data to providers prior to load     Hearing Screening   Method: Audiometry   125Hz  250Hz  500Hz  1000Hz  2000Hz  3000Hz  4000Hz  6000Hz  8000Hz   Right ear:   20 20 20  20     Left ear:   20 20 20  20     Vision Screening Comments: Wears Glasses-Eye  Exam scheduled with Dr. 11/15/2019  Hypertension:   Well controlled at home per pt on no medication.  She feel anxious being out today given COVID BP Readings from Last 3 Encounters:  10/26/19 (!) 150/70  10/23/18 (!) 146/64  10/14/17 140/80  Using medication without problems or lightheadedness: none Chest pain with exertion:none Edema:none Short of breath:none Average home BPs:  120s/70s Other issues:  Asthma, moderate:Stable control on high dose advair, singulair.  Elevated Cholesterol:  Lab Results  Component Value Date   CHOL 234 (H) 10/21/2019   HDL 76.70 10/21/2019   LDLCALC 103 (H) 10/23/2018   LDLDIRECT 99.0 10/21/2019   TRIG 224.0 (H) 10/21/2019   CHOLHDL 3 10/21/2019  Using medications without problems: Muscle aches:  Diet compliance: good, but eating more cookies Exercise: walking  5 days a week Other complaints:  Hiatal hernia, dysmotiity, chronic .Inez Pilgrim She reports choking and swallowing is much better.  Chronic mucus production .. hoarse frequently. Using guafenesin.  No flares.   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 has improved with CBD oil. Also helps her insomnia. She had noted more foot drop and weakening in right lower leg. She does have atrophy in right leg.01/13/2020  Has new AFO brace fitting well.  Vit in nml range  No longer on supplement  This visit occurred during the SARS-CoV-2 public health emergency.  Safety protocols were in place, including screening questions prior to the  visit, additional usage of staff PPE, and extensive cleaning of exam room while observing appropriate contact time as indicated for disinfecting solutions.   COVID 19 screen:  No recent travel or known exposure to COVID19 The patient denies respiratory symptoms of COVID 19 at this time. The importance of social distancing was discussed today.     Review of Systems  Constitutional: Negative for chills and fever.  HENT: Negative for congestion and ear  pain.   Eyes: Negative for pain and redness.  Respiratory: Negative for cough and shortness of breath.   Cardiovascular: Negative for chest pain, palpitations and leg swelling.  Gastrointestinal: Negative for abdominal pain, blood in stool, constipation, diarrhea, nausea and vomiting.  Genitourinary: Negative for dysuria.  Musculoskeletal: Negative for falls and myalgias.  Skin: Negative for rash.  Neurological: Negative for dizziness.  Psychiatric/Behavioral: Negative for depression. The patient is not nervous/anxious.       Past Medical History:  Diagnosis Date  . Allergy   . Anxiety    associated with dyphagia  . Arthritis    osteo of left hip  . Asthma    moderate persistant  . 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 Deviated septum    2002  . Hypertension   . 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 Post-polio syndrome   . Scarlet fever   . Sleep apnea    severe - CPAP  . Wears dentures    lower partial    reports that she has quit smoking. She has never used smokeless tobacco. She reports current alcohol use. She reports that she does not use drugs.   Current Outpatient Medications:  .  Calcium Carbonate-Vitamin D (CALTRATE 600+D) 600-400 MG-UNIT per tablet, Take 1 tablet by mouth daily.  , Disp: , Rfl:  .  cetirizine (ZYRTEC) 10 MG tablet, Take 10 mg by mouth daily., Disp: , Rfl:  .  Dextromethorphan-Guaifenesin 20-400 MG TABS, Take by mouth 4 (four) times daily as needed.  , Disp: , Rfl:  .  DOCOSAHEXAENOIC ACID PO, Take by mouth., Disp: , Rfl:  .  Esomeprazole Magnesium (NEXIUM PO), Take 20 mg by mouth daily. , Disp: , Rfl:  .  Fluticasone-Salmeterol (ADVAIR) 500-50 MCG/DOSE AEPB, INHALE 1 DOSE BY MOUTH EVERY 12 HOURS, Disp: 60 each, Rfl: 11 .  Glucosamine-Chondroitin-MSM 500-400-300 MG TABS, Take 1 tablet by mouth 2 (two) times  daily. , Disp: , Rfl:  .  latanoprost (XALATAN) 0.005 % ophthalmic solution, Place 1 drop into the left eye at bedtime. , Disp: , Rfl:  .  Melatonin 5 MG TABS, Take 20-40 mg by mouth at bedtime. , Disp: , Rfl:  .  montelukast (SINGULAIR) 10 MG tablet, TAKE 1 TABLET BY MOUTH AT BEDTIME, Disp: 90 tablet, Rfl: 0 .  Multiple Vitamins-Minerals (MULTIVITAMIN WITH MINERALS) tablet, Take 1 tablet by mouth daily., Disp: , Rfl:  .  OVER THE COUNTER MEDICATION, CBD as needed for pain, Disp: , Rfl:  .  PROAIR HFA 108 (90 Base) MCG/ACT inhaler, Inhale 2 puffs into the lungs every 6 (six) hours as needed for wheezing or shortness of breath., Disp: 18 g, Rfl: 11 .  senna (SENOKOT) 8.6 MG tablet, Take 3 tablets by mouth 2 (two) times daily. , Disp: , Rfl:  .  Sennosides-Docusate Sodium 8.6-50 MG CAPS, Take 2 capsules by  mouth daily., Disp: , Rfl:    Observations/Objective: Blood pressure (!) 150/70, pulse 94, temperature 98 F (36.7 C), temperature source Temporal, height 5' 0.25" (1.53 m), weight 120 lb 4 oz (54.5 kg), SpO2 97 %.  Physical Exam Constitutional:      General: She is not in acute distress.    Appearance: Normal appearance. She is well-developed. She is not ill-appearing or toxic-appearing.  HENT:     Head: Normocephalic.     Right Ear: Hearing, tympanic membrane, ear canal and external ear normal. Tympanic membrane is not erythematous, retracted or bulging.     Left Ear: Hearing, tympanic membrane, ear canal and external ear normal. Tympanic membrane is not erythematous, retracted or bulging.     Nose: Nose normal. No mucosal edema or rhinorrhea.     Right Sinus: No maxillary sinus tenderness or frontal sinus tenderness.     Left Sinus: No maxillary sinus tenderness or frontal sinus tenderness.     Mouth/Throat:     Pharynx: Uvula midline.  Eyes:     General: Lids are normal. Lids are everted, no foreign bodies appreciated.     Conjunctiva/sclera: Conjunctivae normal.     Pupils: Pupils  are equal, round, and reactive to light.  Neck:     Thyroid: No thyroid mass or thyromegaly.     Vascular: No carotid bruit.     Trachea: Trachea normal.  Cardiovascular:     Rate and Rhythm: Normal rate and regular rhythm.     Pulses: Normal pulses.     Heart sounds: Normal heart sounds, S1 normal and S2 normal. No murmur. No friction rub. No gallop.   Pulmonary:     Effort: Pulmonary effort is normal. No tachypnea or respiratory distress.     Breath sounds: Normal breath sounds. No decreased breath sounds, wheezing, rhonchi or rales.  Abdominal:     General: Bowel sounds are normal. There is no distension or abdominal bruit.     Palpations: Abdomen is soft. There is no fluid wave or mass.     Tenderness: There is no abdominal tenderness. There is no guarding or rebound.     Hernia: No hernia is present.  Musculoskeletal:     Cervical back: Normal range of motion and neck supple.  Lymphadenopathy:     Cervical: No cervical adenopathy.  Skin:    General: Skin is warm and dry.     Findings: No rash.  Neurological:     Mental Status: She is alert.     Cranial Nerves: No cranial nerve deficit.     Sensory: No sensory deficit.     Motor: Atrophy and abnormal muscle tone present.     Comments:  Right foot drop, weakness and atrophy of right distal leg  Psychiatric:        Mood and Affect: Mood is not anxious or depressed.        Speech: Speech normal.        Behavior: Behavior normal. Behavior is cooperative.        Thought Content: Thought content normal.        Judgment: Judgment normal.      Assessment and Plan   The patient's preventative maintenance and recommended screening tests for an annual wellness exam were reviewed in full today. Brought up to date unless services declined.  Counselled on the importance of diet, exercise, and its role in overall health and mortality. The patient's FH and SH was reviewed, including their home life, tobacco status,  and drug and  alcohol status.   Vaccines:Uptodate PNA, prevnar, given Flu in 07/2019.. considering shingrix but has had zostavax. Pap/DVE:Not indicated Mammo: 2018 nml repeat q2 years Bone Density:2018osteopenia. Vit D Recheck in 5 years. Colon:Colonoscopy: 02/2015 hemorrhoids are cause of positive IFOB, repeat colonoscopy not indicated. Smoking Status:none ETOH/ drug ZOX:WRUEuse:none She has advance directives.  Asthma, moderate persistent .Stable control.  Essential hypertension, benign Well controlled at home.  HYPERCHOLESTEROLEMIA Medication not indicated. Work on low [employer]fat/chol diet.  POST-POLIO SYNDROME Encouraged strengthening exercises as able. Uses brace.  OSA (obstructive sleep apnea) ON CPAP    Kerby NoraAmy Bedsole, MD

## 2019-10-26 NOTE — Assessment & Plan Note (Signed)
Well controlled at home.

## 2019-10-27 ENCOUNTER — Other Ambulatory Visit: Payer: Self-pay | Admitting: Family Medicine

## 2019-11-25 DIAGNOSIS — H04123 Dry eye syndrome of bilateral lacrimal glands: Secondary | ICD-10-CM | POA: Diagnosis not present

## 2020-01-17 ENCOUNTER — Other Ambulatory Visit: Payer: Self-pay | Admitting: Family Medicine

## 2020-03-16 ENCOUNTER — Other Ambulatory Visit: Payer: Self-pay | Admitting: Family Medicine

## 2020-05-24 ENCOUNTER — Telehealth: Payer: Self-pay | Admitting: Family Medicine

## 2020-05-24 DIAGNOSIS — G4733 Obstructive sleep apnea (adult) (pediatric): Secondary | ICD-10-CM

## 2020-05-24 NOTE — Telephone Encounter (Signed)
Patient called in stating she is needing to have an order for a replacement for her CPAP. Patient stated she contacted [facility] and was instructed to call office. Screen on CPAP is reading that its motor has exceeded its lifetime. Please advise.

## 2020-05-24 NOTE — Telephone Encounter (Signed)
DME order placed in Epic for CPAP machine.  [facility] notified through Epic about order.

## 2020-05-26 DIAGNOSIS — H401121 Primary open-angle glaucoma, left eye, mild stage: Secondary | ICD-10-CM | POA: Diagnosis not present

## 2020-05-30 ENCOUNTER — Telehealth: Payer: Self-pay | Admitting: *Deleted

## 2020-05-30 NOTE — Telephone Encounter (Signed)
Patient called stating that [facility] needs a copy of her last sleep study that was done. Patient stated that they need the final results faxed to them.  Patient stated that she gave Dr. Ermalene Searing a copy of the report for her records. Patient stated that she is in need of some new equipment. Fax # [phone]

## 2020-05-30 NOTE — Telephone Encounter (Signed)
Please forward records as requested

## 2020-05-31 NOTE — Telephone Encounter (Signed)
Sleep study faxed to [facility] as requested.  I did advise Courtney Chandler that since her sleep study is greater than 79 years old, her insurance my require a new sleep study before covering new CPAP equipment.

## 2020-06-08 ENCOUNTER — Telehealth: Payer: Self-pay | Admitting: Family Medicine

## 2020-06-08 NOTE — Telephone Encounter (Signed)
Ms. Vivar dropped off complete sleep study.  Faxed to [facility] at [phone].  Sleep study also sent to be scanned into patient's chart.

## 2020-06-08 NOTE — Telephone Encounter (Signed)
Pt calling back [facility] needs more than final report of sleep study; [facility] needs complete paperwork study for both nights of sleep study. If [facility] does not have complete study pt has a copy of this info but cannot fax herself. Pt request cb when done.

## 2020-06-08 NOTE — Telephone Encounter (Signed)
Patient came in with records from [facility]. I made copies and placed them on the cart.

## 2020-06-08 NOTE — Telephone Encounter (Signed)
Spoke with [name] and advised that I have faxed everything we have on file in her chart.  She has a copy of her complete sleep study she will bring to office for me to copy and fax to [facility].

## 2020-06-08 NOTE — Telephone Encounter (Signed)
Noted  

## 2020-06-26 ENCOUNTER — Ambulatory Visit (INDEPENDENT_AMBULATORY_CARE_PROVIDER_SITE_OTHER): Payer: Medicare Other | Admitting: Family Medicine

## 2020-06-26 ENCOUNTER — Other Ambulatory Visit: Payer: Self-pay

## 2020-06-26 ENCOUNTER — Encounter: Payer: Self-pay | Admitting: Family Medicine

## 2020-06-26 VITALS — BP 130/74 | HR 98 | Temp 96.5°F | Ht 60.25 in | Wt 117.5 lb

## 2020-06-26 DIAGNOSIS — B370 Candidal stomatitis: Secondary | ICD-10-CM

## 2020-06-26 MED ORDER — NYSTATIN 100000 UNIT/ML MT SUSP: 5.0000 mL | Freq: Four times a day (QID) | OROMUCOSAL | 0 refills | Status: DC

## 2020-06-26 NOTE — Progress Notes (Signed)
   Subjective:    Patient ID: Courtney Chandler, female    DOB: [birth year], 79 y.o.   MRN: [id number]  HPI Chief Complaint  Patient presents with  . Thrush  Symptoms started 4 days ago. Has had before. Knows that it is a possible side effect of her Advair discus. Always rinses mouth after using. Gums feel inflamed. Has some white patches, bad taste in her mouth. No new oral products. Has had thrush in past, not recently. No recent antibiotics.   Has been having difficulty with her apartment, people are smoking marijuana above and behind her. She is supposed to be living in a non smoking apartment. Her son is helping her look for somewhere to move to. She has been sleeping in a mask to limit her exposure to second hand smoke.    Review of Systems Per HPI    Objective:   Physical Exam Vitals reviewed.  Constitutional:      General: She is not in acute distress.    Appearance: Normal appearance. She is normal weight. She is not ill-appearing, toxic-appearing or diaphoretic.  HENT:     Head: Normocephalic and atraumatic.     Mouth/Throat:     Mouth: Mucous membranes are moist.     Comments: Small amount white film on tongue.  Cardiovascular:     Rate and Rhythm: Normal rate.  Pulmonary:     Effort: Pulmonary effort is normal.  Musculoskeletal:     Cervical back: Normal range of motion and neck supple.  Skin:    General: Skin is warm and dry.  Neurological:     Mental Status: She is alert and oriented to person, place, and time.  Psychiatric:        Mood and Affect: Mood normal.        Behavior: Behavior normal.        Thought Content: Thought content normal.        Judgment: Judgment normal.      BP 130/74   Pulse 98   Temp (!) 96.5 F (35.8 C) (Temporal)   Ht 5' 0.25" (1.53 m)   Wt 117 lb 8 oz (53.3 kg)   SpO2 97%   BMI 22.76 kg/m  Wt Readings from Last 3 Encounters:  06/26/20 117 lb 8 oz (53.3 kg)  10/26/19 120 lb 4 oz (54.5 kg)  10/23/18 117 lb 12 oz (53.4 kg)         Assessment & Plan:  1. Oral thrush - Provided written and verbal information regarding diagnosis and treatment. - follow up if no improvement with tretament - nystatin (MYCOSTATIN) 100000 UNIT/ML suspension; Take 5 mLs (500,000 Units total) by mouth 4 (four) times daily.  Dispense: 60 mL; Refill: 0  This visit occurred during the SARS-CoV-2 public health emergency.  Safety protocols were in place, including screening questions prior to the visit, additional usage of staff PPE, and extensive cleaning of exam room while observing appropriate contact time as indicated for disinfecting solutions.    Olean Ree, FNP-BC  [facility], [facility]  06/26/2020 10:12 AM

## 2020-06-26 NOTE — Patient Instructions (Signed)
Oral Thrush, Adult  Oral thrush is an infection in your mouth and throat. It causes white patches on your tongue and in your mouth. Follow these instructions at home: Helping with soreness   To lessen your pain: ? Drink cold liquids, like water and iced tea. ? Eat frozen ice pops or frozen juices. ? Eat foods that are easy to swallow, like gelatin and ice cream. ? Drink from a straw if the patches in your mouth are painful. General instructions  Take or use over-the-counter and prescription medicines only as told by your doctor. Medicine for oral thrush may be something to swallow, or it may be something to put on the infected area.  Eat plain yogurt that has live cultures in it. Read the label to make sure.  If you wear dentures: ? Take out your dentures before you go to bed. ? Brush them well. ? Soak them in a denture cleaner.  Rinse your mouth with warm salt-water many times a day. To make the salt-water mixture, completely dissolve 1/2-1 teaspoon of salt in 1 cup of warm water. Contact a doctor if:  Your problems are getting worse.  Your problems do not get better in less than 7 days with treatment.  Your infection is spreading. This may show as white patches on the skin outside of your mouth.  You are nursing your baby and you have redness and pain in the nipples. This information is not intended to replace advice given to you by your health care provider. Make sure you discuss any questions you have with your health care provider. Document Revised: 01/30/2018 Document Reviewed: 07/22/2016 Elsevier Patient Education  2020 Elsevier Inc.  

## 2020-06-29 ENCOUNTER — Other Ambulatory Visit: Payer: Self-pay | Admitting: Family Medicine

## 2020-06-29 ENCOUNTER — Telehealth: Payer: Self-pay

## 2020-06-29 DIAGNOSIS — B370 Candidal stomatitis: Secondary | ICD-10-CM

## 2020-06-29 MED ORDER — NYSTATIN 100000 UNIT/ML MT SUSP: 5.0000 mL | Freq: Four times a day (QID) | OROMUCOSAL | 0 refills | Status: DC

## 2020-06-29 NOTE — Telephone Encounter (Signed)
Pt left a message on triage VM stating she has completed her Nystatin course for Thrush and still has white patches and funny taste. Asking if she can get more medication. Please advise at [phone].

## 2020-06-29 NOTE — Telephone Encounter (Signed)
Left message for Courtney Chandler with below information from [place].

## 2020-06-29 NOTE — Telephone Encounter (Signed)
Please call patient and tell her that I have sent in a larger container of the nystatin so she can take longer. If not resolved in 2 weeks, please follow up.

## 2020-07-13 ENCOUNTER — Other Ambulatory Visit: Payer: Self-pay

## 2020-07-13 ENCOUNTER — Telehealth: Payer: Self-pay

## 2020-07-13 ENCOUNTER — Encounter: Payer: Self-pay | Admitting: Family Medicine

## 2020-07-13 ENCOUNTER — Ambulatory Visit (INDEPENDENT_AMBULATORY_CARE_PROVIDER_SITE_OTHER): Payer: Medicare Other | Admitting: Family Medicine

## 2020-07-13 VITALS — BP 126/70 | HR 70 | Temp 97.5°F | Ht 60.25 in | Wt 117.5 lb

## 2020-07-13 DIAGNOSIS — B370 Candidal stomatitis: Secondary | ICD-10-CM | POA: Diagnosis not present

## 2020-07-13 DIAGNOSIS — G4733 Obstructive sleep apnea (adult) (pediatric): Secondary | ICD-10-CM

## 2020-07-13 MED ORDER — FLUCONAZOLE 150 MG PO TABS: ORAL_TABLET | ORAL | 0 refills | Status: DC

## 2020-07-13 NOTE — Progress Notes (Addendum)
Chief Complaint  Patient presents with  . CPAP    Here for face-to-face for CPAP.    History of Present Illness: HPI    79 year old female patient with history for sleep apnea presents for face to face evaluation for renewal of need for CPAP.   She was diagnosed with sleep apnea in 2002 with a sleep study in [facility].. (scanned in chart and reviewed) Started CPAP.Marland Kitchen noted dramatic improvement in daytime fatigue, improved rest and sleep.  Shortness of breath was improved.  Had another home sleep study in 2009 positive for sleep apnea.  No change in weight or neck proportions.  She has been without since  Spring 2021... her CPAP no longer functions. Patient was using and benefiting from machine before it broke  Without the CPAP she has frequent nighttime waking, daytime fatigue. She wishes to get CPAP through  [facility] at [phone], [phone]  BP Readings from Last 3 Encounters:  07/13/20 126/70  06/26/20 130/74  10/26/19 (!) 150/70   She continued to have oral thrush despite oral nystatin swish and spit since 06/26/2020.. white plaque presents and mouth irritation.  She is on inhaled steroid.   This visit occurred during the SARS-CoV-2 public health emergency.  Safety protocols were in place, including screening questions prior to the visit, additional usage of staff PPE, and extensive cleaning of exam room while observing appropriate contact time as indicated for disinfecting solutions.   COVID 19 screen:  No recent travel or known exposure to COVID19 The patient denies respiratory symptoms of COVID 19 at this time. The importance of social distancing was discussed today.     Review of Systems  Constitutional: Negative for chills and fever.  HENT: Negative for congestion and ear pain.   Eyes: Negative for pain and redness.  Respiratory: Negative for cough and shortness of breath.   Cardiovascular: Negative for chest pain,  palpitations and leg swelling.  Gastrointestinal: Negative for abdominal pain, blood in stool, constipation, diarrhea, nausea and vomiting.  Genitourinary: Negative for dysuria.  Musculoskeletal: Negative for falls and myalgias.  Skin: Negative for rash.  Neurological: Negative for dizziness.  Psychiatric/Behavioral: Negative for depression. The patient is not nervous/anxious.       Past Medical History:  Diagnosis Date  . Allergy   . Anxiety    associated with dyphagia  . Arthritis    osteo of left hip  . Asthma    moderate persistant  . 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 Deviated septum    2002  . Hypertension   . 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 Post-polio syndrome   . Scarlet fever   . Sleep apnea    severe - CPAP  . Wears dentures    lower partial    reports that she has quit smoking. She has never used smokeless tobacco. She reports current alcohol use. She reports that she does not use drugs.   Current Outpatient Medications:  .  Calcium Carbonate-Vitamin D (CALTRATE 600+D) 600-400 MG-UNIT per tablet, Take 1 tablet by mouth daily.  , Disp: , Rfl:  .  cetirizine (ZYRTEC) 10 MG tablet, Take 10 mg by mouth daily., Disp: , Rfl:  .  Dextromethorphan-Guaifenesin 20-400 MG TABS, Take by mouth 4 (four) times daily as needed.  , Disp: , Rfl:  .  DOCOSAHEXAENOIC ACID PO, Take by mouth., Disp: , Rfl:  .  Esomeprazole Magnesium (NEXIUM PO), Take 20 mg by mouth daily. , Disp: , Rfl:  .  Fluticasone-Salmeterol (ADVAIR) 500-50 MCG/DOSE AEPB, INHALE 1 DOSE BY MOUTH EVERY 12 HOURS, Disp: 180 each, Rfl: 3 .  Glucosamine-Chondroitin-MSM 500-400-300 MG TABS, Take 1 tablet by mouth 2 (two) times daily. , Disp: , Rfl:  .  latanoprost (XALATAN) 0.005 % ophthalmic solution, Place 1 drop into the left eye at bedtime. , Disp: , Rfl:  .  Melatonin  5 MG TABS, Take 20-40 mg by mouth at bedtime. , Disp: , Rfl:  .  montelukast (SINGULAIR) 10 MG tablet, TAKE 1 TABLET BY MOUTH AT BEDTIME, Disp: 90 tablet, Rfl: 3 .  Multiple Vitamins-Minerals (MULTIVITAMIN WITH MINERALS) tablet, Take 1 tablet by mouth daily., Disp: , Rfl:  .  nystatin (MYCOSTATIN) 100000 UNIT/ML suspension, Take 5 mLs (500,000 Units total) by mouth 4 (four) times daily., Disp: 473 mL, Rfl: 0 .  OVER THE COUNTER MEDICATION, CBD as needed for pain, Disp: , Rfl:  .  PROAIR HFA 108 (90 Base) MCG/ACT inhaler, INHALE 2 PUFFS BY MOUTH EVERY 6 HOURS AS NEEDED FOR WHEEZING OR  SHORTNESS  OF  BREATH, Disp: 18 g, Rfl: 5 .  senna (SENOKOT) 8.6 MG tablet, Take 3 tablets by mouth 2 (two) times daily. , Disp: , Rfl:  .  Sennosides-Docusate Sodium 8.6-50 MG CAPS, Take 2 capsules by mouth daily., Disp: , Rfl:    Observations/Objective: Blood pressure 126/70, pulse 70, temperature (!) 97.5 F (36.4 C), temperature source Temporal, height 5' 0.25" (1.53 m), weight 117 lb 8 oz (53.3 kg), SpO2 96 %.  Physical Exam Constitutional:      General: She is not in acute distress.    Appearance: Normal appearance. She is well-developed. She is not ill-appearing or toxic-appearing.  HENT:     Head: Normocephalic.     Comments: Small  narrow oropharynx.    Right Ear: Hearing, tympanic membrane, ear canal and external ear normal. Tympanic membrane is not erythematous, retracted or bulging.     Left Ear: Hearing, tympanic membrane, ear canal and external ear normal. Tympanic membrane is not erythematous, retracted or bulging.     Nose: No mucosal edema or rhinorrhea.     Right Sinus: No maxillary sinus tenderness or frontal sinus tenderness.     Left Sinus: No maxillary sinus tenderness or frontal sinus tenderness.     Mouth/Throat:     Pharynx: Uvula midline.  Eyes:     General: Lids are normal. Lids are everted, no foreign bodies appreciated.     Conjunctiva/sclera: Conjunctivae normal.     Pupils:  Pupils are equal, round, and reactive to light.  Neck:     Thyroid: No thyroid mass or thyromegaly.     Vascular: No carotid bruit.     Trachea: Trachea normal.  Cardiovascular:     Rate and Rhythm: Normal rate and regular rhythm.     Pulses: Normal pulses.     Heart sounds: Normal heart sounds, S1 normal and S2 normal. No murmur heard.  No friction rub. No gallop.   Pulmonary:     Effort: Pulmonary effort is normal. No tachypnea or respiratory distress.     Breath sounds: Normal breath sounds. No decreased breath sounds, wheezing, rhonchi or rales.  Abdominal:     General: Bowel sounds are normal.     Palpations: Abdomen is soft.     Tenderness: There is no  abdominal tenderness.  Musculoskeletal:     Cervical back: Normal range of motion and neck supple.  Skin:    General: Skin is warm and dry.     Findings: No rash.  Neurological:     Mental Status: She is alert.  Psychiatric:        Mood and Affect: Mood is not anxious or depressed.        Speech: Speech normal.        Behavior: Behavior normal. Behavior is cooperative.        Thought Content: Thought content normal.        Judgment: Judgment normal.      Assessment and Plan Oral thrush Treat with fluconazole x 1 , can repeat prn.  Flush mouth after ADVAIR use.   Obstructive sleep apnea syndrome  Continued need for CPAP based on symptoms and past sleep studies.  . Patient was using and benefiting from machine before it broke.       Kerby Nora, MD

## 2020-07-13 NOTE — Telephone Encounter (Signed)
Per Dr. Ermalene Searing, faxed today's OV notes to [facility], per pt request, to [phone] and [phone].  FYI to [employer].

## 2020-07-13 NOTE — Assessment & Plan Note (Signed)
Treat with fluconazole x 1 , can repeat prn.  Flush mouth after ADVAIR use.

## 2020-07-13 NOTE — Assessment & Plan Note (Signed)
Continued need for CPAP based on symptoms and past sleep studies.  Marland Kitchen

## 2020-07-19 ENCOUNTER — Telehealth: Payer: Self-pay | Admitting: *Deleted

## 2020-07-19 MED ORDER — FLUCONAZOLE 150 MG PO TABS: 150.0000 mg | ORAL_TABLET | Freq: Every day | ORAL | 0 refills | Status: DC

## 2020-07-19 NOTE — Telephone Encounter (Signed)
Patient called stating that the thrush has not cleared up. Patient stated that she has taken two pills of the medication that was prescribed for the thrush. Patient stated yesterday her mouth was lighter, but today it looks worse. Patient wants to know what the next step would be? Pharmacy [facility]

## 2020-07-19 NOTE — Telephone Encounter (Signed)
[place] notified as instructed by telephone.  Patient states understanding.

## 2020-07-19 NOTE — Telephone Encounter (Signed)
I will send in a longer course of fluconazole. If not better with this.Marland Kitchen it may not be thrush.

## 2020-07-25 NOTE — Addendum Note (Signed)
Addended by: Damita Lack on: 07/25/2020 04:25 PM   Modules accepted: Orders

## 2020-07-27 NOTE — Telephone Encounter (Signed)
Appointment scheduled for 07/28/2020 at 10:40 am.

## 2020-07-27 NOTE — Telephone Encounter (Signed)
Pt left v/m that she has finished her medication and there is no change in her tongue. Pt request cb with next step. Pt last seen 07/13/20.

## 2020-07-27 NOTE — Telephone Encounter (Signed)
Have pt make a follow up appt for re-eval of mouth and likely labs.

## 2020-07-28 ENCOUNTER — Ambulatory Visit (INDEPENDENT_AMBULATORY_CARE_PROVIDER_SITE_OTHER): Payer: Medicare Other | Admitting: Family Medicine

## 2020-07-28 ENCOUNTER — Other Ambulatory Visit: Payer: Self-pay

## 2020-07-28 VITALS — BP 122/66 | HR 94 | Temp 97.3°F | Ht 60.25 in | Wt 115.5 lb

## 2020-07-28 DIAGNOSIS — K136 Irritative hyperplasia of oral mucosa: Secondary | ICD-10-CM | POA: Diagnosis not present

## 2020-07-28 DIAGNOSIS — Z23 Encounter for immunization: Secondary | ICD-10-CM | POA: Diagnosis not present

## 2020-07-28 NOTE — Progress Notes (Signed)
Chief Complaint  Patient presents with  . Follow-up    thrush    History of Present Illness: HPI  79 year old female presents with continued oral irritation and white tounge. Ongoing since 05/13/2020.   Not improved with oral nystatin swish and spit... not improved with 150 mg  Fluconazole x 7 days.  She reports  white plaque in middle of tounge when awaking in mornings.  Feeling linin ofg roof of mouth is raw. Gums  Feel sore.  No plaque  On cheeks.  No burning of tounge.   Using sublingual CBD.Marland Kitchen no change in tounge after taking.  On Advair. Rinsing out mouth afterwards.  No fever, no touble swallowing. No burning in throat.   Mouth feels dry.   This has started after stopped using CPAP.   This visit occurred during the SARS-CoV-2 public health emergency.  Safety protocols were in place, including screening questions prior to the visit, additional usage of staff PPE, and extensive cleaning of exam room while observing appropriate contact time as indicated for disinfecting solutions.   COVID 19 screen:  No recent travel or known exposure to COVID19 The patient denies respiratory symptoms of COVID 19 at this time. The importance of social distancing was discussed today.     Review of Systems  Constitutional: Negative for chills and fever.  HENT: Negative for congestion and ear pain.   Eyes: Negative for pain and redness.  Respiratory: Negative for cough and shortness of breath.   Cardiovascular: Negative for chest pain, palpitations and leg swelling.  Gastrointestinal: Negative for abdominal pain, blood in stool, constipation, diarrhea, nausea and vomiting.  Genitourinary: Negative for dysuria.  Musculoskeletal: Negative for falls and myalgias.  Skin: Negative for rash.  Neurological: Negative for dizziness.  Psychiatric/Behavioral: Negative for depression. The patient is not nervous/anxious.       Past Medical History:  Diagnosis Date  . Allergy   . Anxiety     associated with dyphagia  . Arthritis    osteo of left hip  . Asthma    moderate persistant  . 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 Deviated septum    2002  . Hypertension   . 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 Post-polio syndrome   . Scarlet fever   . Sleep apnea    severe - CPAP  . Wears dentures    lower partial    reports that she has quit smoking. She has never used smokeless tobacco. She reports current alcohol use. She reports that she does not use drugs.   Current Outpatient Medications:  .  Calcium Carbonate-Vitamin D (CALTRATE 600+D) 600-400 MG-UNIT per tablet, Take 1 tablet by mouth daily.  , Disp: , Rfl:  .  cetirizine (ZYRTEC) 10 MG tablet, Take 10 mg by mouth daily., Disp: , Rfl:  .  Dextromethorphan-Guaifenesin 20-400 MG TABS, Take by mouth 4 (four) times daily as needed.  , Disp: , Rfl:  .  DOCOSAHEXAENOIC ACID PO, Take by mouth., Disp: , Rfl:  .  Esomeprazole Magnesium (NEXIUM PO), Take 20 mg by mouth daily. , Disp: , Rfl:  .  fluconazole (DIFLUCAN) 150 MG tablet, Take 1 tablet (150 mg total) by mouth daily., Disp: 7 tablet, Rfl: 0 .  Fluticasone-Salmeterol (ADVAIR) 500-50 MCG/DOSE AEPB, INHALE 1 DOSE BY MOUTH EVERY 12 HOURS, Disp:  180 each, Rfl: 3 .  Glucosamine-Chondroitin-MSM 500-400-300 MG TABS, Take 1 tablet by mouth 2 (two) times daily. , Disp: , Rfl:  .  latanoprost (XALATAN) 0.005 % ophthalmic solution, Place 1 drop into the left eye at bedtime. , Disp: , Rfl:  .  Melatonin 5 MG TABS, Take 20-40 mg by mouth at bedtime. , Disp: , Rfl:  .  montelukast (SINGULAIR) 10 MG tablet, TAKE 1 TABLET BY MOUTH AT BEDTIME, Disp: 90 tablet, Rfl: 3 .  Multiple Vitamins-Minerals (MULTIVITAMIN WITH MINERALS) tablet, Take 1 tablet by mouth daily., Disp: , Rfl:  .  OVER THE COUNTER MEDICATION, CBD- sublingual for sleep every night and  topically as needed for pain, Disp: , Rfl:  .  PROAIR HFA 108 (90 Base) MCG/ACT inhaler, INHALE 2 PUFFS BY MOUTH EVERY 6 HOURS AS NEEDED FOR WHEEZING OR  SHORTNESS  OF  BREATH, Disp: 18 g, Rfl: 5 .  Sennosides-Docusate Sodium 8.6-50 MG CAPS, Take 3 capsules by mouth daily. , Disp: , Rfl:    Observations/Objective: Blood pressure 122/66, pulse 94, temperature (!) 97.3 F (36.3 C), temperature source Temporal, height 5' 0.25" (1.53 m), weight 115 lb 8 oz (52.4 kg), SpO2 98 %.  Physical Exam Constitutional:      General: She is not in acute distress.    Appearance: Normal appearance. She is well-developed. She is not ill-appearing or toxic-appearing.  HENT:     Head: Normocephalic.     Salivary Glands: Right salivary gland is not diffusely enlarged or tender. Left salivary gland is not diffusely enlarged or tender.     Comments: Minimal white plaque at base of tounge ad midline.. no oral erythema or other plaque    Right Ear: Hearing, tympanic membrane, ear canal and external ear normal. Tympanic membrane is not erythematous, retracted or bulging.     Left Ear: Hearing, tympanic membrane, ear canal and external ear normal. Tympanic membrane is not erythematous, retracted or bulging.     Nose: No mucosal edema or rhinorrhea.     Right Sinus: No maxillary sinus tenderness or frontal sinus tenderness.     Left Sinus: No maxillary sinus tenderness or frontal sinus tenderness.     Mouth/Throat:     Mouth: Mucous membranes are dry.     Dentition: Normal dentition. Does not have dentures. No dental tenderness, gingival swelling, dental caries, dental abscesses or gum lesions.     Pharynx: Uvula midline.     Tonsils: No tonsillar exudate or tonsillar abscesses.  Eyes:     General: Lids are normal. Lids are everted, no foreign bodies appreciated.     Conjunctiva/sclera: Conjunctivae normal.     Pupils: Pupils are equal, round, and reactive to light.  Neck:     Thyroid: No thyroid mass or  thyromegaly.     Vascular: No carotid bruit.     Trachea: Trachea normal.  Cardiovascular:     Rate and Rhythm: Normal rate and regular rhythm.     Pulses: Normal pulses.     Heart sounds: Normal heart sounds, S1 normal and S2 normal. No murmur heard.  No friction rub. No gallop.   Pulmonary:     Effort: Pulmonary effort is normal. No tachypnea or respiratory distress.     Breath sounds: Normal breath sounds. No decreased breath sounds, wheezing, rhonchi or rales.  Abdominal:     General: Bowel sounds are normal.     Palpations: Abdomen is soft.     Tenderness: There is no abdominal tenderness.  Musculoskeletal:     Cervical back: Normal range of motion and neck supple.  Lymphadenopathy:     Cervical: No cervical adenopathy.  Skin:    General: Skin is warm and dry.     Findings: No rash.  Neurological:     Mental Status: She is alert.  Psychiatric:        Mood and Affect: Mood is not anxious or depressed.        Speech: Speech normal.        Behavior: Behavior normal. Behavior is cooperative.        Thought Content: Thought content normal.        Judgment: Judgment normal.      Assessment and Plan   Irritation of oral cavity Minimal oral cahnges. Pt at higher risk for thrush given Advair use.. ? Other cause of white plaque such as no longer on CPAP or dry mouth?  Versus resistant candida Will send fungal culture.    Kerby Nora, MD

## 2020-07-28 NOTE — Assessment & Plan Note (Signed)
Minimal oral cahnges. Pt at higher risk for thrush given Advair use.. ? Other cause of white plaque such as no longer on CPAP or dry mouth?  Versus resistant candida Will send fungal culture.

## 2020-08-06 LAB — CULTURE, YEAST, WITH IDENTIFICATION
Micro Number:: 10969859
Specimen Quality:: ADEQUATE

## 2020-08-18 DIAGNOSIS — Z23 Encounter for immunization: Secondary | ICD-10-CM | POA: Diagnosis not present

## 2020-08-28 ENCOUNTER — Telehealth: Payer: Self-pay | Admitting: *Deleted

## 2020-08-28 NOTE — Telephone Encounter (Signed)
Patient states that she received her new CPAP machine today.

## 2020-09-15 ENCOUNTER — Telehealth: Payer: Self-pay

## 2020-09-15 NOTE — Telephone Encounter (Signed)
Pt left v/m that she has a cpap thru [facility]; pt spoke with [facility] today and pts cpap has blue tooth so cpap can be monitored by [facility]. Pt slept in her studio and did not use cpap for one night while pt had company staying with her. Today pt was told by [facility] that if pt changes her routine or does anything different pt has to let [facility] know. Pt feels like her privacy is being violated. Pt is not sure she can continue with [facility] under these circumstances. Pt is sure [facility] will be contacting Dr Ermalene Searing. FYI to Dr Ermalene Searing.

## 2020-09-15 NOTE — Telephone Encounter (Signed)
Noted  

## 2020-09-28 DIAGNOSIS — Z1231 Encounter for screening mammogram for malignant neoplasm of breast: Secondary | ICD-10-CM | POA: Diagnosis not present

## 2020-09-28 LAB — HM MAMMOGRAPHY

## 2020-10-02 ENCOUNTER — Encounter: Payer: Self-pay | Admitting: Family Medicine

## 2020-10-13 ENCOUNTER — Telehealth: Payer: Self-pay | Admitting: *Deleted

## 2020-10-13 DIAGNOSIS — G4730 Sleep apnea, unspecified: Secondary | ICD-10-CM

## 2020-10-13 NOTE — Telephone Encounter (Signed)
[facility] notified as instructed by telephone.  She is agreeable to pulmonology referral.  Prefers [place].

## 2020-10-13 NOTE — Telephone Encounter (Signed)
Received fax from [facility] stating that Courtney Chandler CAI is high and the pressure that she is et on is not controlling her CAI.  Previous 10 days of usage data was provided from Courtney Chandler's CPAP machine for Dr. Ermalene Searing to review.  [facility] is asking if we would like to make any changes or if you have any questions.  Per Dr. Ermalene Searing, she is not trained to adjust CPAP and that patient should consult pulmonology.  Left message for Courtney Chandler to return my call.  I also faxed this report with Dr. Daphine Deutscher comments back to [facility].

## 2020-10-13 NOTE — Telephone Encounter (Signed)
central apnea index (CAI): number of central apneic episodes per hour of sleep.  I can refer her to pulmonary... [place] or [place] if she feels she is not feeling adequately rested.

## 2020-10-13 NOTE — Telephone Encounter (Signed)
Courtney Chandler notified as instructed by telephone.  She is asking what CAI stood for.  I do not know so I told her I would send message to Dr. Ermalene Searing to advise.  Patient also states she does not have a pulmonologist.

## 2020-10-16 ENCOUNTER — Telehealth: Payer: Self-pay | Admitting: Family Medicine

## 2020-10-16 DIAGNOSIS — E7800 Pure hypercholesterolemia, unspecified: Secondary | ICD-10-CM

## 2020-10-16 DIAGNOSIS — E559 Vitamin D deficiency, unspecified: Secondary | ICD-10-CM

## 2020-10-16 NOTE — Telephone Encounter (Signed)
-----   Message from Aquilla Solian, RT sent at 10/11/2020  1:57 PM EST ----- Regarding: Lab Orders for Wednesday 12.15.2021 Please place lab orders for Wednesday 12.15.2021, office visit for physical on Tuesday 12.21.2021 Thank you, Jones Bales RT(R)

## 2020-10-25 ENCOUNTER — Other Ambulatory Visit (INDEPENDENT_AMBULATORY_CARE_PROVIDER_SITE_OTHER): Payer: Medicare Other

## 2020-10-25 ENCOUNTER — Other Ambulatory Visit: Payer: Self-pay

## 2020-10-25 DIAGNOSIS — E7800 Pure hypercholesterolemia, unspecified: Secondary | ICD-10-CM

## 2020-10-25 DIAGNOSIS — E559 Vitamin D deficiency, unspecified: Secondary | ICD-10-CM | POA: Diagnosis not present

## 2020-10-25 LAB — COMPREHENSIVE METABOLIC PANEL
ALT: 12 U/L (ref 0–35)
AST: 16 U/L (ref 0–37)
Albumin: 4.2 g/dL (ref 3.5–5.2)
Alkaline Phosphatase: 56 U/L (ref 39–117)
BUN: 13 mg/dL (ref 6–23)
CO2: 29 mEq/L (ref 19–32)
Calcium: 9.7 mg/dL (ref 8.4–10.5)
Chloride: 105 mEq/L (ref 96–112)
Creatinine, Ser: 0.86 mg/dL (ref 0.40–1.20)
GFR: 64.69 mL/min (ref >60.00)
Glucose, Bld: 76 mg/dL (ref 70–99)
Potassium: 3.9 mEq/L (ref 3.5–5.1)
Sodium: 142 mEq/L (ref 135–145)
Total Bilirubin: 0.6 mg/dL (ref 0.2–1.2)
Total Protein: 7 g/dL (ref 6.0–8.3)

## 2020-10-25 LAB — LIPID PANEL
Cholesterol: 222 mg/dL — ABNORMAL HIGH (ref 0–200)
HDL: 92.7 mg/dL (ref >39.00)
LDL Cholesterol: 106 mg/dL — ABNORMAL HIGH (ref 0–99)
NonHDL: 129.42
Total CHOL/HDL Ratio: 2
Triglycerides: 115 mg/dL (ref 0.0–149.0)
VLDL: 23 mg/dL (ref 0.0–40.0)

## 2020-10-25 LAB — VITAMIN D 25 HYDROXY (VIT D DEFICIENCY, FRACTURES): VITD: 48.58 ng/mL (ref 30.00–100.00)

## 2020-10-25 NOTE — Progress Notes (Signed)
No critical labs need to be addressed urgently. We will discuss labs in detail at upcoming office visit.   

## 2020-10-26 ENCOUNTER — Other Ambulatory Visit: Payer: Self-pay | Admitting: Family Medicine

## 2020-10-31 ENCOUNTER — Encounter: Payer: Self-pay | Admitting: Family Medicine

## 2020-10-31 ENCOUNTER — Ambulatory Visit (INDEPENDENT_AMBULATORY_CARE_PROVIDER_SITE_OTHER): Payer: Medicare Other | Admitting: Family Medicine

## 2020-10-31 ENCOUNTER — Other Ambulatory Visit: Payer: Self-pay

## 2020-10-31 VITALS — BP 140/80 | HR 100 | Temp 97.7°F | Ht 60.0 in | Wt 118.4 lb

## 2020-10-31 DIAGNOSIS — E7800 Pure hypercholesterolemia, unspecified: Secondary | ICD-10-CM

## 2020-10-31 DIAGNOSIS — Z0000 Encounter for general adult medical examination without abnormal findings: Secondary | ICD-10-CM | POA: Diagnosis not present

## 2020-10-31 DIAGNOSIS — G4733 Obstructive sleep apnea (adult) (pediatric): Secondary | ICD-10-CM

## 2020-10-31 DIAGNOSIS — J4540 Moderate persistent asthma, uncomplicated: Secondary | ICD-10-CM

## 2020-10-31 DIAGNOSIS — I10 Essential (primary) hypertension: Secondary | ICD-10-CM | POA: Diagnosis not present

## 2020-10-31 DIAGNOSIS — Z5309 Procedure and treatment not carried out because of other contraindication: Secondary | ICD-10-CM

## 2020-10-31 NOTE — Patient Instructions (Addendum)
Check BP home and make sure its at goal < 140/90-.  Can consider zetia for cholesterol treatment.  Keep working on heart healthy diet.

## 2020-10-31 NOTE — Progress Notes (Signed)
Patient ID: ALIYHA FORNES, female    DOB: 11-03-1941, 79 y.o.   MRN: [id number]  This visit was conducted in person. Blood pressure 140/80, pulse 100, temperature 97.7 F (36.5 C), temperature source Temporal, height 5' (1.524 m), weight 118 lb 6.4 oz (53.7 kg), SpO2 97 %.  CC:  AMW Subjective:   HPI: Courtney Chandler is a 79 y.o. female presenting on 10/31/2020 for Annual Exam    The patient presents for annual medicare wellness and review of chronic health problems. He/She also has the following acute concerns today: none  I have personally reviewed the Medicare Annual Wellness questionnaire and have noted 1. The patient's medical and social history 2. Their use of alcohol, tobacco or illicit drugs 3. Their current medications and supplements 4. The patient's functional ability including ADL's, fall risks, home safety risks and hearing or visual             impairment. 5. Diet and physical activities 6. Evidence for depression or mood disorders 7.         Updated provider list Cognitive evaluation was performed and recorded on pt medicare questionnaire form. The patients weight, height, BMI and visual acuity have been recorded in the chart  I have made referrals, counseling and provided education to the patient based review of the above and I have provided the pt with a written personalized care plan for preventive services.   Documentation of this information was scanned into the electronic record under the media tab.   Advance directives and end of life planning reviewed in detail with patient and documented in EMR. Patient given handout on advance care directives if needed. HCPOA and living will updated if needed.   Hearing Screening   125Hz  250Hz  500Hz  1000Hz  2000Hz  3000Hz  4000Hz  6000Hz  8000Hz   Right ear:   20 20 20  20     Left ear:   20 20 20  20     Vision Screening Comments: Done on 05/26/2020    Fall Risk  10/31/2020 10/26/2019 10/06/2019 10/23/2018 09/28/2018   Falls in the past year? 0 0 0 0 0  Comment - - Emmi Telephone Survey: data to providers prior to load - Emmi Telephone Survey: data to providers prior to load  Risk for fall due to : No Fall Risks - - - -    PHQ9 SCORE ONLY 10/31/2020 10/26/2019 10/23/2018  PHQ-9 Total Score 0 0 0      Elevated Cholesterol: HDL high at 92, LDL not at goal < 100, trigs now in nml range.  SE to statin.. cause pancreatitis. Lab Results  Component Value Date   CHOL 222 (H) 10/25/2020   HDL 92.70 10/25/2020   LDLCALC 106 (H) 10/25/2020   LDLDIRECT 99.0 10/21/2019   TRIG 115.0 10/25/2020   CHOLHDL 2 10/25/2020  Using medications without problems: Muscle aches:  Diet compliance: good Exercise: walking Other complaints: The 10-year ASCVD risk score 09/30/2018 DC 10-08-1981., et al., 2013) is: 27%   Values used to calculate the score:     Age: 17 years     Sex: Female     Is Non-Hispanic African American: No     Diabetic: No     Tobacco smoker: No     Systolic Blood Pressure: 140 mmHg     Is BP treated: No     HDL Cholesterol: 92.7 mg/dL     Total Cholesterol: 222 mg/dL   OSA: has CPAP set up now. has improved energy, less brain  fog.  [facility] states CAI  High.. pt referred to pulmonary for titration.  Hypertension:   Borderline control in office today on no med. BP Readings from Last 3 Encounters:  10/31/20 140/80  07/28/20 122/66  07/13/20 126/70  Using medication without problems or lightheadedness: none Chest pain with exertion:none Edema: none Short of breath: none Average home BPs: not checking lately. Other issues:  Asthma, moderate persistent: Stable control on advair 500/50 1 dose BID, using albuterol prn.  Relevant past medical, surgical, family and social history reviewed and updated as indicated. Interim medical history since our last visit reviewed. Allergies and medications reviewed and updated. Outpatient Medications Prior to Visit  Medication Sig Dispense Refill  . Calcium  Carbonate-Vitamin D 600-400 MG-UNIT tablet Take 1 tablet by mouth daily.    . cetirizine (ZYRTEC) 10 MG tablet Take 10 mg by mouth daily.    Marland Kitchen Dextromethorphan-Guaifenesin 20-400 MG TABS Take by mouth 4 (four) times daily as needed.    . DOCOSAHEXAENOIC ACID PO Take by mouth.    . Esomeprazole Magnesium (NEXIUM PO) Take 20 mg by mouth daily.     . fluconazole (DIFLUCAN) 150 MG tablet Take 1 tablet (150 mg total) by mouth daily. 7 tablet 0  . Fluticasone-Salmeterol (ADVAIR) 500-50 MCG/DOSE AEPB INHALE 1 DOSE BY MOUTH EVERY 12 HOURS 180 each 0  . Glucosamine-Chondroitin-MSM 500-400-300 MG TABS Take 1 tablet by mouth 2 (two) times daily.    Marland Kitchen latanoprost (XALATAN) 0.005 % ophthalmic solution Place 1 drop into the left eye at bedtime.     . Melatonin 5 MG TABS Take 20-40 mg by mouth at bedtime.     . montelukast (SINGULAIR) 10 MG tablet TAKE 1 TABLET BY MOUTH AT BEDTIME 90 tablet 3  . Multiple Vitamins-Minerals (MULTIVITAMIN WITH MINERALS) tablet Take 1 tablet by mouth daily.    Marland Kitchen OVER THE COUNTER MEDICATION CBD- sublingual for sleep every night and topically as needed for pain    . PROAIR HFA 108 (90 Base) MCG/ACT inhaler INHALE 2 PUFFS BY MOUTH EVERY 6 HOURS AS NEEDED FOR WHEEZING OR  SHORTNESS  OF  BREATH 18 g 5  . Sennosides-Docusate Sodium 8.6-50 MG CAPS Take 3 capsules by mouth daily.      No facility-administered medications prior to visit.     Per HPI unless specifically indicated in ROS section below Review of Systems  Constitutional: Negative for fatigue, fever and unexpected weight change.  HENT: Negative for congestion, ear pain, sinus pressure, sneezing, sore throat and trouble swallowing.   Eyes: Negative for pain and itching.  Respiratory: Negative for cough, shortness of breath and wheezing.   Cardiovascular: Negative for chest pain, palpitations and leg swelling.  Gastrointestinal: Negative for abdominal pain, blood in stool, constipation, diarrhea and nausea.  Genitourinary:  Negative for difficulty urinating, dysuria, hematuria, menstrual problem and vaginal discharge.  Skin: Negative for rash.  Neurological: Negative for syncope, weakness, light-headedness, numbness and headaches.  Psychiatric/Behavioral: Negative for confusion and dysphoric mood. The patient is not nervous/anxious.    Objective:  There were no vitals taken for this visit.  Wt Readings from Last 3 Encounters:  07/28/20 115 lb 8 oz (52.4 kg)  07/13/20 117 lb 8 oz (53.3 kg)  06/26/20 117 lb 8 oz (53.3 kg)      Physical Exam Constitutional:      General: She is not in acute distress.Vital signs are normal.     Appearance: Normal appearance. She is well-developed and well-nourished. She is not ill-appearing  or toxic-appearing.  HENT:     Head: Normocephalic.     Right Ear: Hearing, tympanic membrane, ear canal and external ear normal.     Left Ear: Hearing, tympanic membrane, ear canal and external ear normal.     Nose: Nose normal.  Eyes:     General: Lids are normal. Lids are everted, no foreign bodies appreciated.     Extraocular Movements: EOM normal.     Conjunctiva/sclera: Conjunctivae normal.     Pupils: Pupils are equal, round, and reactive to light.  Neck:     Thyroid: No thyroid mass or thyromegaly.     Vascular: No carotid bruit.     Trachea: Trachea normal.  Cardiovascular:     Rate and Rhythm: Normal rate and regular rhythm.     Pulses: Intact distal pulses.     Heart sounds: Normal heart sounds, S1 normal and S2 normal. No murmur heard. No gallop.   Pulmonary:     Effort: Pulmonary effort is normal. No respiratory distress.     Breath sounds: Normal breath sounds. No wheezing, rhonchi or rales.  Abdominal:     General: Bowel sounds are normal. There is no distension or abdominal bruit.     Palpations: Abdomen is soft. There is no fluid wave, hepatosplenomegaly or mass.     Tenderness: There is no abdominal tenderness. There is no CVA tenderness, guarding or rebound.      Hernia: No hernia is present.  Musculoskeletal:     Cervical back: Normal range of motion and neck supple.  Lymphadenopathy:     Cervical: No cervical adenopathy.     Upper Body:  No axillary adenopathy present. Skin:    General: Skin is warm, dry and intact.     Findings: No rash.  Neurological:     Mental Status: She is alert.     Cranial Nerves: No cranial nerve deficit.     Sensory: No sensory deficit.     Deep Tendon Reflexes: Strength normal.  Psychiatric:        Mood and Affect: Mood is not anxious or depressed.        Speech: Speech normal.        Behavior: Behavior normal. Behavior is cooperative.        Cognition and Memory: Cognition and memory normal.        Judgment: Judgment normal.       Results for orders placed or performed in visit on 10/25/20  VITAMIN D 25 Hydroxy (Vit-D Deficiency, Fractures)  Result Value Ref Range   VITD 48.58 30.00 - 100.00 ng/mL  Comprehensive metabolic panel  Result Value Ref Range   Sodium 142 135 - 145 mEq/L   Potassium 3.9 3.5 - 5.1 mEq/L   Chloride 105 96 - 112 mEq/L   CO2 29 19 - 32 mEq/L   Glucose, Bld 76 70 - 99 mg/dL   BUN 13 6 - 23 mg/dL   Creatinine, Ser 4.400.86 0.40 - 1.20 mg/dL   Total Bilirubin 0.6 0.2 - 1.2 mg/dL   Alkaline Phosphatase 56 39 - 117 U/L   AST 16 0 - 37 U/L   ALT 12 0 - 35 U/L   Total Protein 7.0 6.0 - 8.3 g/dL   Albumin 4.2 3.5 - 5.2 g/dL   GFR 10.2764.69 >25.36>60.00 mL/min   Calcium 9.7 8.4 - 10.5 mg/dL  Lipid panel  Result Value Ref Range   Cholesterol 222 (H) 0 - 200 mg/dL   Triglycerides 644.0115.0 0.0 -  149.0 mg/dL   HDL 71.06 >26.94 mg/dL   VLDL 85.4 0.0 - 62.7 mg/dL   LDL Cholesterol 035 (H) 0 - 99 mg/dL   Total CHOL/HDL Ratio 2    NonHDL 129.42     This visit occurred during the SARS-CoV-2 public health emergency.  Safety protocols were in place, including screening questions prior to the visit, additional usage of staff PPE, and extensive cleaning of exam room while observing appropriate contact  time as indicated for disinfecting solutions.   COVID 19 screen:  No recent travel or known exposure to COVID19 The patient denies respiratory symptoms of COVID 19 at this time. The importance of social distancing was discussed today.   Assessment and Plan   The patient's preventative maintenance and recommended screening tests for an annual wellness exam were reviewed in full today. Brought up to date unless services declined.  Counselled on the importance of diet, exercise, and its role in overall health and mortality. The patient's FH and SH was reviewed, including their home life, tobacco status, and drug and alcohol status.   Vaccines:Uptodate PNA, COVID x 3  prevnar,givenFluin 07/2020.. considering shingrix but has had zostavax. Pap/DVE:Not indicated Mammo:09/2020 nml repeat q2 years Bone Density:2018osteopenia. Vit D Recheck in 5 years. Colon:Colonoscopy: 02/2015 hemorrhoids are cause of positive IFOB, repeat colonoscopy not indicated. Smoking Status:none ETOH/ drug KKX:FGHW She has advance directives.  hep C: due  Problem List Items Addressed This Visit    Asthma, moderate persistent     Stable control on advair 500/50 1 dose BID, using albuterol prn.       Essential hypertension, benign    Borderline control on no med. Will follow at home. Work on lifestyle changes.      HYPERCHOLESTEROLEMIA    Statin contraindicated given pancreatitis in past.  Encouraged exercise, weight loss, healthy eating habits. Consider zetia.      Obstructive sleep apnea syndrome     CPAP, referred to pulmonary for titration.      Statins contraindicated    Other Visit Diagnoses    Medicare annual wellness visit, subsequent    -  Primary       Kerby Nora, MD

## 2020-10-31 NOTE — Assessment & Plan Note (Signed)
Borderline control on no med. Will follow at home. Work on lifestyle changes.

## 2020-10-31 NOTE — Assessment & Plan Note (Signed)
Statin contraindicated given pancreatitis in past.  Encouraged exercise, weight loss, healthy eating habits. Consider zetia.

## 2020-11-14 ENCOUNTER — Encounter: Payer: Self-pay | Admitting: Primary Care

## 2020-11-14 ENCOUNTER — Ambulatory Visit: Payer: Medicare Other | Admitting: Family Medicine

## 2020-11-15 ENCOUNTER — Other Ambulatory Visit: Payer: Self-pay

## 2020-11-15 ENCOUNTER — Encounter: Payer: Self-pay | Admitting: Primary Care

## 2020-11-15 ENCOUNTER — Ambulatory Visit (INDEPENDENT_AMBULATORY_CARE_PROVIDER_SITE_OTHER): Payer: Medicare Other | Admitting: Primary Care

## 2020-11-15 DIAGNOSIS — G4733 Obstructive sleep apnea (adult) (pediatric): Secondary | ICD-10-CM

## 2020-11-15 NOTE — Addendum Note (Signed)
Addended by: Lajoyce Lauber A on: 11/15/2020 02:38 PM   Modules accepted: Orders

## 2020-11-15 NOTE — Progress Notes (Signed)
Reviewed and agree with assessment/plan.   Mare Ludtke, MD [facility] 11/15/2020, 1:25 PM Pager:  [phone]  

## 2020-11-15 NOTE — Assessment & Plan Note (Addendum)
-   Dx severe OSA in 2006, sleep titration study at outside facility showed optimal CPAP pressure was 8cm h20.  - She is compliant with CPAP and reports benefit from use  - Current auto CPAP pressure 16-20cm h20; Residual AHI 8.4 (5 central; 1.8 obstructive) - Higher pressure setting may be contributing to central events as well as melatonin/CBD she takes to help her sleep. Recommend decreasingCPAP pressure to 10-20cm h20. May consider repeating CPAP titration study if no improvement.  - FU in 3 months with Dr. Craige Cotta or APP

## 2020-11-15 NOTE — Progress Notes (Addendum)
@Patient  ID: Courtney Chandler, female    DOB: [birth year], 80 y.o.   MRN: [id number]  Chief Complaint  Patient presents with  . sleep consult    Per Dr. Esmond Camper cpap avg 6-7hr nightly- feels pressure and mask is okay. CZY:SAYTK    Referring provider: Jinny Sanders, MD  HPI: 80 year old female, former smoker. PMH significant for HTN, allergic rhinitis, asthma, sleep apnea, osteopenia. Hx severe OSA with hypoxemia. She had sleep titration study in 2006 with [facility], optimal CPAP pressure was between 6-8cm h20.   11/15/2020 Patient patient presents today for sleep consult, referred by PCP d/t increased central apnea index. She has been on CPAP for several years, this is her third machine. She is 100% compliant with use > 4 hours. She sleeps well, no significant daytime fatigue. She reports improved energy and less brain fog with use. Current pressure setting is 16-20cm h20. She recently changed from nasal to full face mask. Her DME company is [employer]. She is maintained on Advair 500/50 1 puff twice daily for moderate persistent asthma. She rarely requires her rescue inhaler  Sleep questionnaire Symptoms: Restless sleep, choking/stops breathing at night Previous sleep study: 2006 [facility]  Average bedtime: 9-10 pm Time it takes to fall asleep: varies Nocturnal awakenings: 1 Time out of bed in morning: 7-8 am Epworth score: 0/24  Airview download 10/16/20-11/15/19 Usage 30/30 days; 100% > 4 hours  Average usage 6 hours 54 mins Pressure 16-20cm h20 (18.5 cm h20- 95%) Airleaks 27.6 L/min AHI 8.4 (5.0 central; 1.8 obstructive)   Allergies  Allergen Reactions  . Statins      pancreatitis  . Beta Adrenergic Blockers     Asthma exacerbation  . Codeine     REACTION: Nausea and vomiting  . Erythromycin     REACTION: Nausea and vomiting    Immunization History  Administered Date(s) Administered  . Fluad Quad(high Dose 65+) 08/05/2019, 07/28/2020  . Influenza  Split 09/27/2011, 08/04/2012  . Influenza Whole 10/02/2007, 08/16/2008, 07/30/2010  . Influenza, High Dose Seasonal PF 08/26/2013, 07/13/2020  . Influenza,inj,Quad PF,6+ Mos 10/03/2014, 10/09/2015, 10/10/2016, 09/26/2017, 09/24/2018  . PFIZER SARS-COV-2 Vaccination 12/02/2019, 12/23/2019, 08/18/2020  . Pneumococcal Conjugate-13 10/03/2014  . Pneumococcal Polysaccharide-23 03/31/2008, 06/22/2017  . Td 10/02/2007, 10/10/2017  . Zoster 08/14/2010    Past Medical History:  Diagnosis Date  . Allergy   . Anxiety    associated with dyphagia  . Arthritis    osteo of left hip  . Asthma    moderate persistant  . 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 Deviated septum    2002  . Hypertension   . 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 Post-polio syndrome   . Scarlet fever   . Sleep apnea    severe - CPAP  . Wears dentures    lower partial    Tobacco History: Social History   Tobacco Use  Smoking Status Former Smoker  . Packs/day: 0.50  . Years: 8.00  . Pack years: 4.00  . Types: Cigarettes  . Quit date: 21  . Years since quitting: 34.0  Smokeless Tobacco Never Used  Tobacco Comment   quit 1981   Counseling given: Not Answered Comment: quit 1981   Outpatient Medications Prior to Visit  Medication Sig Dispense Refill  . Calcium Carbonate-Vitamin D  600-400 MG-UNIT tablet Take 1 tablet by mouth daily.    . cetirizine (ZYRTEC) 10 MG tablet Take 10 mg by mouth daily.    Marland Kitchen Dextromethorphan-Guaifenesin 20-400 MG TABS Take by mouth 4 (four) times daily as needed.    . DOCOSAHEXAENOIC ACID PO Take by mouth.    . Esomeprazole Magnesium (NEXIUM PO) Take 20 mg by mouth daily.     . fluconazole (DIFLUCAN) 150 MG tablet Take 1 tablet (150 mg total) by mouth daily. 7 tablet 0  . Fluticasone-Salmeterol (ADVAIR) 500-50 MCG/DOSE AEPB INHALE 1 DOSE  BY MOUTH EVERY 12 HOURS 180 each 0  . Glucosamine-Chondroitin-MSM 500-400-300 MG TABS Take 1 tablet by mouth 2 (two) times daily.    Marland Kitchen latanoprost (XALATAN) 0.005 % ophthalmic solution Place 1 drop into the left eye at bedtime.     . Melatonin 5 MG TABS Take 20-40 mg by mouth at bedtime.     . montelukast (SINGULAIR) 10 MG tablet TAKE 1 TABLET BY MOUTH AT BEDTIME 90 tablet 3  . Multiple Vitamins-Minerals (MULTIVITAMIN WITH MINERALS) tablet Take 1 tablet by mouth daily.    Marland Kitchen OVER THE COUNTER MEDICATION CBD- sublingual for sleep every night and topically as needed for pain    . PROAIR HFA 108 (90 Base) MCG/ACT inhaler INHALE 2 PUFFS BY MOUTH EVERY 6 HOURS AS NEEDED FOR WHEEZING OR  SHORTNESS  OF  BREATH 18 g 5  . Sennosides-Docusate Sodium 8.6-50 MG CAPS Take 3 capsules by mouth daily.      No facility-administered medications prior to visit.    Review of Systems  Review of Systems  Constitutional: Negative.  Negative for fatigue.  Respiratory: Negative.   Psychiatric/Behavioral: Negative for sleep disturbance.   Physical Exam  BP 128/74 (BP Location: Left Arm, Cuff Size: Normal)   Pulse 93   Temp (!) 97.3 F (36.3 C) (Temporal)   Ht 5' (1.524 m)   Wt 118 lb (53.5 kg)   SpO2 97%   BMI 23.05 kg/m  Physical Exam Constitutional:      Appearance: Normal appearance.  HENT:     Head: Normocephalic and atraumatic.     Mouth/Throat:     Mouth: Mucous membranes are moist.     Pharynx: Oropharynx is clear.  Cardiovascular:     Rate and Rhythm: Normal rate and regular rhythm.  Pulmonary:     Effort: Pulmonary effort is normal.     Breath sounds: Normal breath sounds.  Skin:    General: Skin is warm and dry.  Neurological:     General: No focal deficit present.     Mental Status: She is alert and oriented to person, place, and time. Mental status is at baseline.  Psychiatric:        Mood and Affect: Mood normal.        Behavior: Behavior normal.        Thought Content: Thought  content normal.        Judgment: Judgment normal.      Lab Results:  CBC    Component Value Date/Time   WBC 10.2 06/27/2017 1458   RBC 4.01 06/27/2017 1458   HGB 13.2 06/27/2017 1458   HCT 38.9 06/27/2017 1458   PLT 400.0 06/27/2017 1458   MCV 96.9 06/27/2017 1458   MCH 33.1 06/23/2017 0442   MCHC 33.9 06/27/2017 1458   RDW 13.0 06/27/2017 1458   LYMPHSABS 1.6 06/27/2017 1458   MONOABS 1.2 (H) 06/27/2017 1458   EOSABS 0.2 06/27/2017 1458  BASOSABS 0.1 06/27/2017 1458    BMET    Component Value Date/Time   NA 142 10/25/2020 0805   K 3.9 10/25/2020 0805   CL 105 10/25/2020 0805   CO2 29 10/25/2020 0805   GLUCOSE 76 10/25/2020 0805   BUN 13 10/25/2020 0805   CREATININE 0.86 10/25/2020 0805   CALCIUM 9.7 10/25/2020 0805   GFRNONAA >60 06/22/2017 0356   GFRAA >60 06/22/2017 0356    BNP No results found for: BNP  ProBNP No results found for: PROBNP  Imaging: No results found.   Assessment & Plan:   Obstructive sleep apnea syndrome - Dx severe OSA in 2006, sleep titration study at outside facility showed optimal CPAP pressure was 8cm h20.  - She is compliant with CPAP and reports benefit from use  - Current auto CPAP pressure 16-20cm h20; Residual AHI 8.4 (5 central; 1.8 obstructive) - Higher pressure setting may be contributing to central events as well as melatonin/CBD she takes to help her sleep. Recommend decreasingCPAP pressure to 10-20cm h20. May consider repeating CPAP titration study if no improvement.  - FU in 3 months with Dr. Craige Cotta or APP   Glenford Bayley, NP 11/15/2020

## 2020-11-15 NOTE — Patient Instructions (Signed)
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 Avoid excessive alcohol or additional sedating medication prior to bedtime as these can worsen underlying sleep apnea  Follow-up:  - 3 months with Dr. Halford Chessman or APP / may need to consider CPAP titrate study at follow-up if still having issues with events    Sleep Apnea Sleep apnea affects breathing during sleep. It causes breathing to stop for a short time or to become shallow. It can also increase the risk of:  Heart attack.  Stroke.  Being very overweight (obese).  Diabetes.  Heart failure.  Irregular heartbeat. The goal of treatment is to help you breathe normally again. What are the causes? There are three kinds of sleep apnea:  Obstructive sleep apnea. This is caused by a blocked or collapsed airway.  Central sleep apnea. This happens when the brain does not send the right signals to the muscles that control breathing.  Mixed sleep apnea. This is a combination of obstructive and central sleep apnea. The most common cause of this condition is a collapsed or blocked airway. This can happen if:  Your throat muscles are too relaxed.  Your tongue and tonsils are too large.  You are overweight.  Your airway is too small. What increases the risk?  Being overweight.  Smoking.  Having a small airway.  Being older.  Being female.  Drinking alcohol.  Taking medicines to calm yourself (sedatives or tranquilizers).  Having family members with the condition. What are the signs or symptoms?  Trouble staying asleep.  Being sleepy or tired during the day.  Getting angry a lot.  Loud snoring.  Headaches in the morning.  Not being able to focus your mind (concentrate).  Forgetting things.  Less interest in sex.  Mood swings.  Personality changes.  Feelings of sadness  (depression).  Waking up a lot during the night to pee (urinate).  Dry mouth.  Sore throat. How is this diagnosed?  Your medical history.  A physical exam.  A test that is done when you are sleeping (sleep study). The test is most often done in a sleep lab but may also be done at home. How is this treated?   Sleeping on your side.  Using a medicine to get rid of mucus in your nose (decongestant).  Avoiding the use of alcohol, medicines to help you relax, or certain pain medicines (narcotics).  Losing weight, if needed.  Changing your diet.  Not smoking.  Using a machine to open your airway while you sleep, such as: ? An oral appliance. This is a mouthpiece that shifts your lower jaw forward. ? A CPAP device. This device blows air through a mask when you breathe out (exhale). ? An EPAP device. This has valves that you put in each nostril. ? A BPAP device. This device blows air through a mask when you breathe in (inhale) and breathe out.  Having surgery if other treatments do not work. It is important to get treatment for sleep apnea. Without treatment, it can lead to:  High blood pressure.  Coronary artery disease.  In men, not being able to have an erection (impotence).  Reduced thinking ability. Follow these instructions at home: Lifestyle  Make changes that your doctor recommends.  Eat a healthy diet.  Lose weight if needed.  Avoid alcohol, medicines to help you relax,  and some pain medicines.  Do not use any products that contain nicotine or tobacco, such as cigarettes, e-cigarettes, and chewing tobacco. If you need help quitting, ask your doctor. General instructions  Take over-the-counter and prescription medicines only as told by your doctor.  If you were given a machine to use while you sleep, use it only as told by your doctor.  If you are having surgery, make sure to tell your doctor you have sleep apnea. You may need to bring your device with  you.  Keep all follow-up visits as told by your doctor. This is important. Contact a doctor if:  The machine that you were given to use during sleep bothers you or does not seem to be working.  You do not get better.  You get worse. Get help right away if:  Your chest hurts.  You have trouble breathing in enough air.  You have an uncomfortable feeling in your back, arms, or stomach.  You have trouble talking.  One side of your body feels weak.  A part of your face is hanging down. These symptoms may be an emergency. Do not wait to see if the symptoms will go away. Get medical help right away. Call your local emergency services (911 in the [place] Do not drive yourself to the hospital. Summary  This condition affects breathing during sleep.  The most common cause is a collapsed or blocked airway.  The goal of treatment is to help you breathe normally while you sleep. This information is not intended to replace advice given to you by your health care provider. Make sure you discuss any questions you have with your health care provider. Document Revised: 08/14/2018 Document Reviewed: 06/23/2018 Elsevier Patient Education  2020 [employer]   CPAP and BPAP Information CPAP and BPAP are methods of helping a person breathe with the use of air pressure. CPAP stands for "continuous positive airway pressure." BPAP stands for "bi-level positive airway pressure." In both methods, air is blown through your nose or mouth and into your air passages to help you breathe well. CPAP and BPAP use different amounts of pressure to blow air. With CPAP, the amount of pressure stays the same while you breathe in and out. With BPAP, the amount of pressure is increased when you breathe in (inhale) so that you can take larger breaths. Your health care provider will recommend whether CPAP or BPAP would be more helpful for you. Why are CPAP and BPAP treatments used? CPAP or BPAP can be helpful if you  have:  Sleep apnea.  Chronic obstructive pulmonary disease (COPD).  Heart failure.  Medical conditions that weaken the muscles of the chest including muscular dystrophy, or neurological diseases such as amyotrophic lateral sclerosis (ALS).  Other problems that cause breathing to be weak, abnormal, or difficult. CPAP is most commonly used for obstructive sleep apnea (OSA) to keep the airways from collapsing when the muscles relax during sleep. How is CPAP or BPAP administered? Both CPAP and BPAP are provided by a small machine with a flexible plastic tube that attaches to a plastic mask. You wear the mask. Air is blown through the mask into your nose or mouth. The amount of pressure that is used to blow the air can be adjusted on the machine. Your health care provider will determine the pressure setting that should be used based on your individual needs. When should CPAP or BPAP be used? In most cases, the mask only needs to be worn during  sleep. Generally, the mask needs to be worn throughout the night and during any daytime naps. People with certain medical conditions may also need to wear the mask at other times when they are awake. Follow instructions from your health care provider about when to use the machine. What are some tips for using the mask?   Because the mask needs to be snug, some people feel trapped or closed-in (claustrophobic) when first using the mask. If you feel this way, you may need to get used to the mask. One way to do this is by holding the mask loosely over your nose or mouth and then gradually applying the mask more snugly. You can also gradually increase the amount of time that you use the mask.  Masks are available in various types and sizes. Some fit over your mouth and nose while others fit over just your nose. If your mask does not fit well, talk with your health care provider about getting a different one.  If you are using a mask that fits over your nose and  you tend to breathe through your mouth, a chin strap may be applied to help keep your mouth closed.  The CPAP and BPAP machines have alarms that may sound if the mask comes off or develops a leak.  If you have trouble with the mask, it is very important that you talk with your health care provider about finding a way to make the mask easier to tolerate. Do not stop using the mask. Stopping the use of the mask could have a negative impact on your health. What are some tips for using the machine?  Place your CPAP or BPAP machine on a secure table or stand near an electrical outlet.  Know where the on/off switch is located on the machine.  Follow instructions from your health care provider about how to set the pressure on your machine and when you should use it.  Do not eat or drink while the CPAP or BPAP machine is on. Food or fluids could get pushed into your lungs by the pressure of the CPAP or BPAP.  Do not smoke. Tobacco smoke residue can damage the machine.  For home use, CPAP and BPAP machines can be rented or purchased through home health care companies. Many different brands of machines are available. Renting a machine before purchasing may help you find out which particular machine works well for you.  Keep the CPAP or BPAP machine and attachments clean. Ask your health care provider for specific instructions. Get help right away if:  You have redness or open areas around your nose or mouth where the mask fits.  You have trouble using the CPAP or BPAP machine.  You cannot tolerate wearing the CPAP or BPAP mask.  You have pain, discomfort, and bloating in your abdomen. Summary  CPAP and BPAP are methods of helping a person breathe with the use of air pressure.  Both CPAP and BPAP are provided by a small machine with a flexible plastic tube that attaches to a plastic mask.  If you have trouble with the mask, it is very important that you talk with your health care provider  about finding a way to make the mask easier to tolerate. This information is not intended to replace advice given to you by your health care provider. Make sure you discuss any questions you have with your health care provider. Document Revised: 02/17/2019 Document Reviewed: 09/16/2016 Elsevier Patient Education  2020 [employer].

## 2020-11-23 DIAGNOSIS — H40001 Preglaucoma, unspecified, right eye: Secondary | ICD-10-CM | POA: Diagnosis not present

## 2021-01-01 NOTE — Assessment & Plan Note (Signed)
CPAP, referred to pulmonary for titration.

## 2021-01-01 NOTE — Assessment & Plan Note (Signed)
Stable control on advair 500/50 1 dose BID, using albuterol prn.

## 2021-01-08 ENCOUNTER — Other Ambulatory Visit: Payer: Self-pay | Admitting: Family Medicine

## 2021-01-23 ENCOUNTER — Other Ambulatory Visit: Payer: Self-pay | Admitting: Family Medicine

## 2021-02-20 ENCOUNTER — Other Ambulatory Visit: Payer: Self-pay

## 2021-02-20 ENCOUNTER — Ambulatory Visit (INDEPENDENT_AMBULATORY_CARE_PROVIDER_SITE_OTHER): Payer: Medicare Other | Admitting: Primary Care

## 2021-02-20 ENCOUNTER — Encounter: Payer: Self-pay | Admitting: Primary Care

## 2021-02-20 DIAGNOSIS — J4540 Moderate persistent asthma, uncomplicated: Secondary | ICD-10-CM

## 2021-02-20 DIAGNOSIS — G4733 Obstructive sleep apnea (adult) (pediatric): Secondary | ICD-10-CM

## 2021-02-20 NOTE — Assessment & Plan Note (Signed)
-   Continue Advair 500/90mcg two puffs twice daily; Montelukast 10mg  at bedtime - Avoid allergen triggers, consider air purifier at home

## 2021-02-20 NOTE — Patient Instructions (Signed)
Pleasure seeing you today Courtney Chandler  30 day download showed you had on average 3.3 events an hour (1.8 central; 0.6 obstructive)  Recommendations: Continue to wear CPAP for 4-6 hours or longer each night Do not drive if experiencing excessive daytime fatigue or somnolence  Ok to continue taking CBD for sleep  Follow-up 1 year with Dr. Craige Cotta or APP / sooner if needed    CPAP and BPAP Information CPAP and BPAP are methods that use air pressure to keep your airways open and to help you breathe well. CPAP and BPAP use different amounts of pressure. Your health care provider will tell you whether CPAP or BPAP would be more helpful for you.  CPAP stands for "continuous positive airway pressure." With CPAP, the amount of pressure stays the same while you breathe in and out.  BPAP stands for "bi-level positive airway pressure." With BPAP, the amount of pressure will be higher when you breathe in (inhale) and lower when you breathe out(exhale). This allows you to take larger breaths. CPAP or BPAP may be used in the hospital, or your health care provider may want you to use it at home. You may need to have a sleep study before your health care provider can order a machine for you to use at home. Why are CPAP and BPAP treatments used? CPAP or BPAP can be helpful if you have:  Sleep apnea.  Chronic obstructive pulmonary disease (COPD).  Heart failure.  Medical conditions that cause muscle weakness, including muscular dystrophy or amyotrophic lateral sclerosis (ALS).  Other problems that cause breathing to be shallow, weak, abnormal, or difficult. CPAP and BPAP are most commonly used for obstructive sleep apnea (OSA) to keep the airways from collapsing when the muscles relax during sleep. How is CPAP or BPAP administered? Both CPAP and BPAP are provided by a small machine with a flexible plastic tube that attaches to a plastic mask that you wear. Air is blown through the mask into your nose or  mouth. The amount of pressure that is used to blow the air can be adjusted on the machine. Your health care provider will set the pressure setting and help you find the best mask for you. When should CPAP or BPAP be used? In most cases, the mask only needs to be worn during sleep. Generally, the mask needs to be worn throughout the night and during any daytime naps. People with certain medical conditions may also need to wear the mask at other times when they are awake. Follow instructions from your health care provider about when to use the machine. What are some tips for using the mask?  Because the mask needs to be snug, some people feel trapped or closed-in (claustrophobic) when first using the mask. If you feel this way, you may need to get used to the mask. One way to do this is to hold the mask loosely over your nose or mouth and then gradually apply the mask more snugly. You can also gradually increase the amount of time that you use the mask.  Masks are available in various types and sizes. If your mask does not fit well, talk with your health care provider about getting a different one. Some common types of masks include: ? Full face masks, which fit over the mouth and nose. ? Nasal masks, which fit over the nose. ? Nasal pillow or prong masks, which fit into the nostrils.  If you are using a mask that fits over your nose  and you tend to breathe through your mouth, a chin strap may be applied to help keep your mouth closed.  Some CPAP and BPAP machines have alarms that may sound if the mask comes off or develops a leak.  If you have trouble with the mask, it is very important that you talk with your health care provider about finding a way to make the mask easier to tolerate. Do not stop using the mask. There could be a negative impact to your health if you stop using the mask.   What are some tips for using the machine?  Place your CPAP or BPAP machine on a secure table or stand near an  electrical outlet.  Know where the on/off switch is on the machine.  Follow instructions from your health care provider about how to set the pressure on your machine and when you should use it.  Do not eat or drink while the CPAP or BPAP machine is on. Food or fluids could get pushed into your lungs by the pressure of the CPAP or BPAP.  For home use, CPAP and BPAP machines can be rented or purchased through home health care companies. Many different brands of machines are available. Renting a machine before purchasing may help you find out which particular machine works well for you. Your insurance may also decide which machine you may get.  Keep the CPAP or BPAP machine and attachments clean. Ask your health care provider for specific instructions. Follow these instructions at home:  Do not use any products that contain nicotine or tobacco, such as cigarettes, e-cigarettes, and chewing tobacco. If you need help quitting, ask your health care provider.  Keep all follow-up visits as told by your health care provider. This is important. Contact a health care provider if:  You have redness or pressure sores on your head, face, mouth, or nose from the mask or head gear.  You have trouble using the CPAP or BPAP machine.  You cannot tolerate wearing the CPAP or BPAP mask.  Someone tells you that you snore even when wearing your CPAP or BPAP. Get help right away if:  You have trouble breathing.  You feel confused. Summary  CPAP and BPAP are methods that use air pressure to keep your airways open and to help you breathe well.  You may need to have a sleep study before your health care provider can order a machine for home use.  If you have trouble with the mask, it is very important that you talk with your health care provider about finding a way to make the mask easier to tolerate. Do not stop using the mask. There could be a negative impact to your health if you stop using the  mask.  Follow instructions from your health care provider about when to use the machine. This information is not intended to replace advice given to you by your health care provider. Make sure you discuss any questions you have with your health care provider. Document Revised: 11/19/2019 Document Reviewed: 11/22/2019 Elsevier Patient Education  2021 [employer].

## 2021-02-20 NOTE — Progress Notes (Signed)
@Patient  ID: , female    DOB: February 12, 1941, 80 y.o.   MRN: [id number]  Chief Complaint  Patient presents with  . Follow-up    Feels that pressure is to low.     Referring provider: [id number], MD  HPI: 80 year old female, former smoker. PMH significant for HTN, allergic rhinitis, asthma, sleep apnea, osteopenia. Hx severe OSA with hypoxemia. She had sleep titration study in 2006 with [facility], optimal CPAP pressure was between 6-8cm h20.   Previous [facility] pulmonary encounter: 11/15/2020 Patient patient presents today for sleep consult, referred by PCP d/t increased central apnea index. She has been on CPAP for several years, this is her third machine. She is 100% compliant with use > 4 hours. She sleeps well, no significant daytime fatigue. She reports improved energy and less brain fog with use. Current pressure setting is 16-20cm h20. She recently changed from nasal to full face mask. Her DME company is [employer]. She is maintained on Advair 500/50 1 puff twice daily for moderate persistent asthma. She rarely requires her rescue inhaler  Airview download 10/16/20-11/15/19 Usage 30/30 days; 100% > 4 hours  Average usage 6 hours 54 mins Pressure 16-20cm h20 (18.5 cm h20- 95%) Airleaks 27.6 L/min AHI 8.4 (5.0 central; 1.8 obstructive)  02/20/2021 - Interim hx Patient presents today for 3 month follow-up. She is currently dealing with allergies which have caused some voice hoarseness. She is compliant with Advair 500/50 and montelukast. She tells me that people in her apartment smoke marijuana which can flare up her asthma. She remains active walking her dog. She has not been sleeping well, she recently restarted taking her CBD last week which helps. She is not drinking wine at night. Pressure remains 16-20cm h20 despite placing an order during our last visit to ajust settings to 10-20cm h20. She is having less overall central/obstructive events. DME [employer]   Airview  download 01/17/21-02/15/21 Usage 30/30 days (100%) ; 29 days (97%) > 4 hours Pressure 16-20cm h20 (17.6cm h20- 95%) Airleaks median 1.8L.min AHI 3.3 (1.8 central; 0.6 obstructive)  Allergies  Allergen Reactions  . Statins      pancreatitis  . Beta Adrenergic Blockers     Asthma exacerbation  . Codeine     REACTION: Nausea and vomiting  . Erythromycin     REACTION: Nausea and vomiting    Immunization History  Administered Date(s) Administered  . Fluad Quad(high Dose 65+) 08/05/2019, 07/28/2020  . Influenza Split 09/27/2011, 08/04/2012  . Influenza Whole 10/02/2007, 08/16/2008, 07/30/2010  . Influenza, High Dose Seasonal PF 08/26/2013, 07/13/2020  . Influenza,inj,Quad PF,6+ Mos 10/03/2014, 10/09/2015, 10/10/2016, 09/26/2017, 09/24/2018  . PFIZER(Purple Top)SARS-COV-2 Vaccination 12/02/2019, 12/23/2019, 08/18/2020  . Pneumococcal Conjugate-13 10/03/2014  . Pneumococcal Polysaccharide-23 03/31/2008, 06/22/2017  . Td 10/02/2007, 10/10/2017  . Zoster 08/14/2010    Past Medical History:  Diagnosis Date  . Allergy   . Anxiety    associated with dyphagia  . Arthritis    osteo of left hip  . Asthma    moderate persistant  . 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 Deviated septum    2002  . Hypertension   . 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 Post-polio syndrome   . Scarlet fever   . Sleep apnea    severe - CPAP  .  Wears dentures    lower partial    Tobacco History: Social History   Tobacco Use  Smoking Status Former Smoker  . Packs/day: 0.50  . Years: 8.00  . Pack years: 4.00  . Types: Cigarettes  . Quit date: 74  . Years since quitting: 34.3  Smokeless Tobacco Never Used  Tobacco Comment   quit 1981   Counseling given: Not Answered Comment: quit 1981   Outpatient Medications Prior to Visit  Medication Sig Dispense  Refill  . Calcium Carbonate-Vitamin D 600-400 MG-UNIT tablet Take 1 tablet by mouth daily.    . cetirizine (ZYRTEC) 10 MG tablet Take 10 mg by mouth daily.    Marland Kitchen Dextromethorphan-Guaifenesin 20-400 MG TABS Take by mouth 4 (four) times daily as needed.    . DOCOSAHEXAENOIC ACID PO Take by mouth.    . Esomeprazole Magnesium (NEXIUM PO) Take 20 mg by mouth daily.     . fluconazole (DIFLUCAN) 150 MG tablet Take 1 tablet (150 mg total) by mouth daily. 7 tablet 0  . Fluticasone-Salmeterol (ADVAIR) 500-50 MCG/DOSE AEPB INHALE 1 DOSE BY MOUTH EVERY 12 HOURS 180 each 3  . Glucosamine-Chondroitin-MSM 500-400-300 MG TABS Take 1 tablet by mouth 2 (two) times daily.    Marland Kitchen latanoprost (XALATAN) 0.005 % ophthalmic solution Place 1 drop into the left eye at bedtime.     . Melatonin 5 MG TABS Take 20-40 mg by mouth at bedtime.     . montelukast (SINGULAIR) 10 MG tablet TAKE 1 TABLET BY MOUTH AT BEDTIME 90 tablet 3  . Multiple Vitamins-Minerals (MULTIVITAMIN WITH MINERALS) tablet Take 1 tablet by mouth daily.    Marland Kitchen OVER THE COUNTER MEDICATION CBD- sublingual for sleep every night and topically as needed for pain    . PROAIR HFA 108 (90 Base) MCG/ACT inhaler INHALE 2 PUFFS BY MOUTH EVERY 6 HOURS AS NEEDED FOR WHEEZING OR  SHORTNESS  OF  BREATH 18 g 5  . Sennosides-Docusate Sodium 8.6-50 MG CAPS Take 3 capsules by mouth daily.      No facility-administered medications prior to visit.    Review of Systems  Review of Systems  Constitutional: Negative.   HENT: Positive for congestion and postnasal drip.        Voice hoarseness  Respiratory: Positive for cough.   Cardiovascular: Negative.     Physical Exam  BP (!) 150/60 (BP Location: Left Arm, Patient Position: Sitting, Cuff Size: Normal)   Pulse 82   Temp 98.2 F (36.8 C) (Temporal)   Ht 4' 10.35" (1.482 m)   Wt 118 lb 3.2 oz (53.6 kg)   SpO2 98%   BMI 24.41 kg/m  Physical Exam Constitutional:      General: She is not in acute distress.     Appearance: Normal appearance. She is not ill-appearing.  HENT:     Head: Normocephalic and atraumatic.  Cardiovascular:     Rate and Rhythm: Normal rate and regular rhythm.  Pulmonary:     Effort: Pulmonary effort is normal.     Breath sounds: Normal breath sounds. No wheezing.  Musculoskeletal:        General: Normal range of motion.  Skin:    General: Skin is warm and dry.  Neurological:     General: No focal deficit present.     Mental Status: She is alert and oriented to person, place, and time. Mental status is at baseline.  Psychiatric:        Mood and Affect: Mood normal.  Behavior: Behavior normal.        Thought Content: Thought content normal.        Judgment: Judgment normal.      Lab Results:  CBC    Component Value Date/Time   WBC 10.2 06/27/2017 1458   RBC 4.01 06/27/2017 1458   HGB 13.2 06/27/2017 1458   HCT 38.9 06/27/2017 1458   PLT 400.0 06/27/2017 1458   MCV 96.9 06/27/2017 1458   MCH 33.1 06/23/2017 0442   MCHC 33.9 06/27/2017 1458   RDW 13.0 06/27/2017 1458   LYMPHSABS 1.6 06/27/2017 1458   MONOABS 1.2 (H) 06/27/2017 1458   EOSABS 0.2 06/27/2017 1458   BASOSABS 0.1 06/27/2017 1458    BMET    Component Value Date/Time   NA 142 10/25/2020 0805   K 3.9 10/25/2020 0805   CL 105 10/25/2020 0805   CO2 29 10/25/2020 0805   GLUCOSE 76 10/25/2020 0805   BUN 13 10/25/2020 0805   CREATININE 0.86 10/25/2020 0805   CALCIUM 9.7 10/25/2020 0805   GFRNONAA >60 06/22/2017 0356   GFRAA >60 06/22/2017 0356    BNP No results found for: BNP  ProBNP No results found for: PROBNP  Imaging: No results found.   Assessment & Plan:   Obstructive sleep apnea syndrome - Dx severe OSA in 2006, sleep titration study at outside facility showed optimal CPAP pressure was 8cm h20. During last visit we placed an order to adjust CPAP pressure to 10-20cm h20 but this was not done by DME company. She is however having less central/obstructive events.  -  Current CPAP pressure 16-20cm h20 with residual AHI 3.3  - No changes today, continue to monitor. She is not sleeping well, ok to resume CBD to help with insomnia. Again this may cause an increase in central apneas but overall she appear well controlled on current settings. Advised she avoid ETOH use and not drive if experiencing excessive daytime fatigue or somnolence. If perists would recommend CPAP titration study  - Follow-up in 1  Year or sooner if needed   Asthma, moderate persistent - Continue Advair 500/65mcg two puffs twice daily; Montelukast 10mg  at bedtime - Avoid allergen triggers, consider air purifier at home    , NP 02/20/2021

## 2021-02-20 NOTE — Assessment & Plan Note (Signed)
-   Dx severe OSA in 2006, sleep titration study at outside facility showed optimal CPAP pressure was 8cm h20. During last visit we placed an order to adjust CPAP pressure to 10-20cm h20 but this was not done by DME company. She is however having less central/obstructive events.  - Current CPAP pressure 16-20cm h20 with residual AHI 3.3  - No changes today, continue to monitor. She is not sleeping well, ok to resume CBD to help with insomnia. Again this may cause an increase in central apneas but overall she appear well controlled on current settings. Advised she avoid ETOH use and not drive if experiencing excessive daytime fatigue or somnolence. If perists would recommend CPAP titration study  - Follow-up in 1  Year or sooner if needed

## 2021-02-21 NOTE — Progress Notes (Signed)
Reviewed and agree with assessment/plan.   Myan Suit, MD [facility] 02/21/2021, 4:01 PM Pager:  [phone]  

## 2021-03-22 DIAGNOSIS — Z23 Encounter for immunization: Secondary | ICD-10-CM | POA: Diagnosis not present

## 2021-05-15 DIAGNOSIS — Z20822 Contact with and (suspected) exposure to covid-19: Secondary | ICD-10-CM | POA: Diagnosis not present

## 2021-05-22 ENCOUNTER — Other Ambulatory Visit: Payer: Self-pay | Admitting: Family Medicine

## 2021-07-30 DIAGNOSIS — H401121 Primary open-angle glaucoma, left eye, mild stage: Secondary | ICD-10-CM | POA: Diagnosis not present

## 2021-08-27 DIAGNOSIS — Z23 Encounter for immunization: Secondary | ICD-10-CM | POA: Diagnosis not present

## 2021-09-20 ENCOUNTER — Other Ambulatory Visit: Payer: Self-pay | Admitting: Family Medicine

## 2021-09-21 MED ORDER — ALBUTEROL SULFATE HFA 108 (90 BASE) MCG/ACT IN AERS: 2.0000 | INHALATION_SPRAY | Freq: Four times a day (QID) | RESPIRATORY_TRACT | 0 refills | Status: DC | PRN

## 2021-09-21 NOTE — Telephone Encounter (Signed)
Rx for Ventolin inhaler sent to [facility] as instructed by Dr. Ermalene Searing.

## 2021-09-21 NOTE — Telephone Encounter (Signed)
Either Proventil or ventolin with same instruction okay to send in.

## 2021-09-21 NOTE — Addendum Note (Signed)
Addended by: Damita Lack on: 09/21/2021 04:12 PM   Modules accepted: Orders

## 2021-09-21 NOTE — Telephone Encounter (Signed)
Received fax form [facility] stating ProAIr has been discontinued by manufacturer.  Pharmacy is requesting Rx for Proventil or Ventolin.

## 2021-09-25 ENCOUNTER — Other Ambulatory Visit: Payer: Self-pay | Admitting: Family Medicine

## 2021-09-25 MED ORDER — PROVENTIL HFA 108 (90 BASE) MCG/ACT IN AERS: 2.0000 | INHALATION_SPRAY | Freq: Four times a day (QID) | RESPIRATORY_TRACT | 2 refills | Status: DC | PRN

## 2021-09-25 NOTE — Addendum Note (Signed)
Addended by: Damita Lack on: 09/25/2021 12:06 PM   Modules accepted: Orders

## 2021-09-27 ENCOUNTER — Telehealth: Payer: Self-pay | Admitting: Family Medicine

## 2021-09-27 NOTE — Telephone Encounter (Signed)
Heather called from [facility] wants to know can proscriptions be change to what is cover by pt  insurance . # [phone]

## 2021-09-27 NOTE — Telephone Encounter (Signed)
Pt call to know status of refill PROVENTIL HFA 108 (90 Base) MCG/ACT inhaler

## 2021-09-27 NOTE — Telephone Encounter (Signed)
Spoke with [name], pharmacist at [facility].  She is asking can she change the inhaler prescription to what [name] will cover.  I advised I thought I had sent in rx for what insurance would cover per CoverMyMeds but I told her to change what needed to be changed in order for it to be covered by insurance.  [name] notified by telephone that I just spoke with the pharmacist and I think everything has been worked out and they will get her inhaler ready for her to pick up today.

## 2021-10-25 ENCOUNTER — Telehealth: Payer: Self-pay | Admitting: Family Medicine

## 2021-10-25 DIAGNOSIS — E559 Vitamin D deficiency, unspecified: Secondary | ICD-10-CM

## 2021-10-25 DIAGNOSIS — E7800 Pure hypercholesterolemia, unspecified: Secondary | ICD-10-CM

## 2021-10-25 DIAGNOSIS — Z1159 Encounter for screening for other viral diseases: Secondary | ICD-10-CM

## 2021-10-25 NOTE — Telephone Encounter (Signed)
-----   Message from Alvina Chou sent at 10/10/2021 11:04 AM EST ----- Regarding: Lab orders for Friday, 12.16.22 Patient is scheduled for CPX labs, please order future labs, Thanks , Camelia Eng

## 2021-10-26 ENCOUNTER — Other Ambulatory Visit: Payer: Medicare Other

## 2021-11-01 ENCOUNTER — Other Ambulatory Visit: Payer: Self-pay

## 2021-11-01 ENCOUNTER — Encounter: Payer: Self-pay | Admitting: Family Medicine

## 2021-11-01 ENCOUNTER — Ambulatory Visit (INDEPENDENT_AMBULATORY_CARE_PROVIDER_SITE_OTHER): Payer: Medicare Other | Admitting: Family Medicine

## 2021-11-01 VITALS — BP 120/70 | HR 92 | Temp 98.1°F | Ht 60.0 in | Wt 115.2 lb

## 2021-11-01 DIAGNOSIS — Z1159 Encounter for screening for other viral diseases: Secondary | ICD-10-CM

## 2021-11-01 DIAGNOSIS — J4540 Moderate persistent asthma, uncomplicated: Secondary | ICD-10-CM | POA: Diagnosis not present

## 2021-11-01 DIAGNOSIS — B91 Sequelae of poliomyelitis: Secondary | ICD-10-CM | POA: Diagnosis not present

## 2021-11-01 DIAGNOSIS — E7800 Pure hypercholesterolemia, unspecified: Secondary | ICD-10-CM | POA: Diagnosis not present

## 2021-11-01 DIAGNOSIS — I10 Essential (primary) hypertension: Secondary | ICD-10-CM | POA: Diagnosis not present

## 2021-11-01 DIAGNOSIS — Z0000 Encounter for general adult medical examination without abnormal findings: Secondary | ICD-10-CM

## 2021-11-01 DIAGNOSIS — E559 Vitamin D deficiency, unspecified: Secondary | ICD-10-CM | POA: Diagnosis not present

## 2021-11-01 LAB — COMPREHENSIVE METABOLIC PANEL
ALT: 15 U/L (ref 0–35)
AST: 21 U/L (ref 0–37)
Albumin: 4.5 g/dL (ref 3.5–5.2)
Alkaline Phosphatase: 50 U/L (ref 39–117)
BUN: 9 mg/dL (ref 6–23)
CO2: 28 mEq/L (ref 19–32)
Calcium: 10.4 mg/dL (ref 8.4–10.5)
Chloride: 105 mEq/L (ref 96–112)
Creatinine, Ser: 0.76 mg/dL (ref 0.40–1.20)
GFR: 74.5 mL/min (ref >60.00)
Glucose, Bld: 82 mg/dL (ref 70–99)
Potassium: 3.5 mEq/L (ref 3.5–5.1)
Sodium: 140 mEq/L (ref 135–145)
Total Bilirubin: 0.8 mg/dL (ref 0.2–1.2)
Total Protein: 7.8 g/dL (ref 6.0–8.3)

## 2021-11-01 LAB — LIPID PANEL
Cholesterol: 219 mg/dL — ABNORMAL HIGH (ref 0–200)
HDL: 83.5 mg/dL (ref >39.00)
LDL Cholesterol: 103 mg/dL — ABNORMAL HIGH (ref 0–99)
NonHDL: 135.59
Total CHOL/HDL Ratio: 3
Triglycerides: 165 mg/dL — ABNORMAL HIGH (ref 0.0–149.0)
VLDL: 33 mg/dL (ref 0.0–40.0)

## 2021-11-01 LAB — VITAMIN D 25 HYDROXY (VIT D DEFICIENCY, FRACTURES): VITD: 78.37 ng/mL (ref 30.00–100.00)

## 2021-11-01 NOTE — Assessment & Plan Note (Signed)
Possibly effective voice and hoarseness.Marland Kitchen offered referral to ENT to eval for other cause of hoarseness, she will consider.

## 2021-11-01 NOTE — Assessment & Plan Note (Signed)
Stable, chronic.  Continue current medication.    

## 2021-11-01 NOTE — Assessment & Plan Note (Signed)
Well controlled at home, element of white coat HTN.

## 2021-11-01 NOTE — Assessment & Plan Note (Signed)
Due for re-eval. 

## 2021-11-01 NOTE — Patient Instructions (Signed)
Please stop at the lab to have labs drawn.   Call if interested in referral to ENT for hoarseness evaluation.

## 2021-11-01 NOTE — Progress Notes (Signed)
Patient ID: Courtney Chandler, female    DOB: 12-08-1940, 80 y.o.   MRN: [id number]  This visit was conducted in person.  BP (!) 150/68    Pulse 92    Temp 98.1 F (36.7 C) (Temporal)    Ht 5' (1.524 m)    Wt 115 lb 4 oz (52.3 kg)    SpO2 99%    BMI 22.51 kg/m    CC:  Chief Complaint  Patient presents with   Medicare Wellness    Subjective:   HPI: Courtney Chandler is a 80 y.o. female presenting on 11/01/2021 for Medicare Wellness  The patient presents for annual medicare wellness, complete physical and review of chronic health problems. He/She also has the following acute concerns today:  I have personally reviewed the Medicare Annual Wellness questionnaire and have noted 1. The patient's medical and social history 2. Their use of alcohol, tobacco or illicit drugs 3. Their current medications and supplements 4. The patient's functional ability including ADL's, fall risks, home safety risks and hearing or visual             impairment. 5. Diet and physical activities 6. Evidence for depression or mood disorders 7.         Updated provider list .Cognitive evaluation was performed and recorded on pt medicare questionnaire form. The patients weight, height, BMI and visual acuity have been recorded in the chart   I have made referrals, counseling and provided education to the patient based review of the above and I have provided the pt with a written personalized care plan for preventive services.    Documentation of this information was scanned into the electronic record under the media tab.   Advance directives and end of life planning reviewed in detail with patient and documented in EMR. Patient given handout on advance care directives if needed. HCPOA and living will updated if needed.  Hearing Screening  Method: Audiometry   500Hz  1000Hz  2000Hz  4000Hz   Right ear 40 20 20 20   Left ear 20 20 20 20   Comments: Wears Glasses-Eye Exam at [facility] with Dr. Wallace Going 07/24/2021    Fall Risk 10/23/2018 10/06/2019 10/26/2019 10/31/2020 11/01/2021  Falls in the past year? 0 0 0 0 0  Patient Fall Risk Level - - - Low fall risk -  Patient at Risk for Falls Due to - - - No Fall Risks -   [place] Office Visit from 11/01/2021 in [place] at [facility] Total Score 0      Due for cholesterol evaluation  SE to statin: caused pancreatitis   She is walking 4 miles daily.  No SOB.  OSA: On CPAP  Hypertension:   At home 120/70  BP Readings from Last 3 Encounters:  11/01/21 (!) 150/68  02/20/21 (!) 150/60  11/15/20 128/74  Using medication without problems or lightheadedness:  none Chest pain with exertion: Edema: Short of breath: Average home BPs: Other issues:   Asthma moderate persistent:  Stable control on advair 500/50 1 dose BID, using albuterol prn.  No recent flares. She has been hoarseness in last year.. worsened by smoke. Upcoming OV in 11/2021.   She feels it may be due to past polio and muscle weakness... she is having        Relevant past medical, surgical, family and social history reviewed and updated as indicated. Interim medical history since our last visit reviewed. Allergies and medications reviewed and updated. Outpatient Medications Prior  to Visit  Medication Sig Dispense Refill   acetaminophen (TYLENOL) 500 MG tablet Take 1,000 mg by mouth as needed.     Calcium Carbonate-Vitamin D 600-400 MG-UNIT tablet Take 1 tablet by mouth daily.     cetirizine (ZYRTEC) 10 MG tablet Take 10 mg by mouth daily.     Dextromethorphan-Guaifenesin 20-400 MG TABS Take by mouth 4 (four) times daily as needed.     DOCOSAHEXAENOIC ACID PO Take by mouth.     Esomeprazole Magnesium (NEXIUM PO) Take 20 mg by mouth daily.      fluconazole (DIFLUCAN) 150 MG tablet Take 1 tablet (150 mg total) by mouth daily. 7 tablet 0   Fluticasone-Salmeterol (ADVAIR) 500-50 MCG/DOSE AEPB INHALE 1 DOSE BY MOUTH EVERY 12 HOURS 180 each 3    Glucosamine-Chondroitin-MSM 500-400-300 MG TABS Take 1 tablet by mouth 2 (two) times daily.     latanoprost (XALATAN) 0.005 % ophthalmic solution Place 1 drop into the left eye at bedtime.      Melatonin 5 MG TABS Take 20-40 mg by mouth at bedtime.      montelukast (SINGULAIR) 10 MG tablet TAKE 1 TABLET BY MOUTH AT BEDTIME 90 tablet 3   Multiple Vitamins-Minerals (MULTIVITAMIN WITH MINERALS) tablet Take 1 tablet by mouth daily.     OVER THE COUNTER MEDICATION CBD- sublingual for sleep every night and topically as needed for pain     PROVENTIL HFA 108 (90 Base) MCG/ACT inhaler Inhale 2 puffs into the lungs every 6 (six) hours as needed for wheezing or shortness of breath. 18 g 2   Sennosides-Docusate Sodium 8.6-50 MG CAPS Take 3 capsules by mouth daily.      No facility-administered medications prior to visit.     Per HPI unless specifically indicated in ROS section below Review of Systems  Constitutional:  Negative for fatigue, fever and unexpected weight change.  HENT:  Negative for congestion, ear pain, sinus pressure, sneezing, sore throat and trouble swallowing.   Eyes:  Negative for pain and itching.  Respiratory:  Negative for cough, shortness of breath and wheezing.   Cardiovascular:  Negative for chest pain, palpitations and leg swelling.  Gastrointestinal:  Negative for abdominal pain, blood in stool, constipation, diarrhea and nausea.  Genitourinary:  Negative for difficulty urinating, dysuria, hematuria, menstrual problem and vaginal discharge.  Skin:  Negative for rash.  Neurological:  Negative for syncope, weakness, light-headedness, numbness and headaches.  Psychiatric/Behavioral:  Negative for confusion and dysphoric mood. The patient is not nervous/anxious.   Objective:  BP (!) 150/68    Pulse 92    Temp 98.1 F (36.7 C) (Temporal)    Ht 5' (1.524 m)    Wt 115 lb 4 oz (52.3 kg)    SpO2 99%    BMI 22.51 kg/m   Wt Readings from Last 3 Encounters:  11/01/21 115 lb 4 oz (52.3  kg)  02/20/21 118 lb 3.2 oz (53.6 kg)  11/15/20 118 lb (53.5 kg)      Physical Exam Vitals and nursing note reviewed.  Constitutional:      General: She is not in acute distress.    Appearance: Normal appearance. She is well-developed. She is not ill-appearing or toxic-appearing.  HENT:     Head: Normocephalic.     Right Ear: Hearing, tympanic membrane, ear canal and external ear normal.     Left Ear: Hearing, tympanic membrane, ear canal and external ear normal.     Nose: Nose normal.  Eyes:  General: Lids are normal. Lids are everted, no foreign bodies appreciated.     Conjunctiva/sclera: Conjunctivae normal.     Pupils: Pupils are equal, round, and reactive to light.  Neck:     Thyroid: No thyroid mass or thyromegaly.     Vascular: No carotid bruit.     Trachea: Trachea normal.  Cardiovascular:     Rate and Rhythm: Normal rate and regular rhythm.     Heart sounds: Normal heart sounds, S1 normal and S2 normal. No murmur heard.   No gallop.  Pulmonary:     Effort: Pulmonary effort is normal. No respiratory distress.     Breath sounds: Normal breath sounds. No wheezing, rhonchi or rales.  Abdominal:     General: Bowel sounds are normal. There is no distension or abdominal bruit.     Palpations: Abdomen is soft. There is no fluid wave or mass.     Tenderness: There is no abdominal tenderness. There is no guarding or rebound.     Hernia: No hernia is present.  Musculoskeletal:     Cervical back: Normal range of motion and neck supple.  Lymphadenopathy:     Cervical: No cervical adenopathy.  Skin:    General: Skin is warm and dry.     Findings: No rash.  Neurological:     Mental Status: She is alert.     Cranial Nerves: No cranial nerve deficit.     Sensory: No sensory deficit.  Psychiatric:        Mood and Affect: Mood is not anxious or depressed.        Speech: Speech normal.        Behavior: Behavior normal. Behavior is cooperative.        Judgment: Judgment  normal.      Results for orders placed or performed in visit on 10/25/20  VITAMIN D 25 Hydroxy (Vit-D Deficiency, Fractures)  Result Value Ref Range   VITD 48.58 30.00 - 100.00 ng/mL  Comprehensive metabolic panel  Result Value Ref Range   Sodium 142 135 - 145 mEq/L   Potassium 3.9 3.5 - 5.1 mEq/L   Chloride 105 96 - 112 mEq/L   CO2 29 19 - 32 mEq/L   Glucose, Bld 76 70 - 99 mg/dL   BUN 13 6 - 23 mg/dL   Creatinine, Ser 0.86 0.40 - 1.20 mg/dL   Total Bilirubin 0.6 0.2 - 1.2 mg/dL   Alkaline Phosphatase 56 39 - 117 U/L   AST 16 0 - 37 U/L   ALT 12 0 - 35 U/L   Total Protein 7.0 6.0 - 8.3 g/dL   Albumin 4.2 3.5 - 5.2 g/dL   GFR 64.69 >60.00 mL/min   Calcium 9.7 8.4 - 10.5 mg/dL  Lipid panel  Result Value Ref Range   Cholesterol 222 (H) 0 - 200 mg/dL   Triglycerides 115.0 0.0 - 149.0 mg/dL   HDL 92.70 >39.00 mg/dL   VLDL 23.0 0.0 - 40.0 mg/dL   LDL Cholesterol 106 (H) 0 - 99 mg/dL   Total CHOL/HDL Ratio 2    NonHDL 129.42     This visit occurred during the SARS-CoV-2 public health emergency.  Safety protocols were in place, including screening questions prior to the visit, additional usage of staff PPE, and extensive cleaning of exam room while observing appropriate contact time as indicated for disinfecting solutions.   COVID 19 screen:  No recent travel or known exposure to COVID19 The patient denies respiratory symptoms of COVID 19  at this time. The importance of social distancing was discussed today.   Assessment and Plan   The patient's preventative maintenance and recommended screening tests for an annual wellness exam were reviewed in full today. Brought up to date unless services declined.  Counselled on the importance of diet, exercise, and its role in overall health and mortality. The patient's FH and SH was reviewed, including their home life, tobacco status, and drug and alcohol status.   Vaccines:Uptodate PNA, COVID x 5,given  Flu in 10/22.. considering  shingrix but has had zostavax. Pap/DVE:  Not indicated Mammo:11/ 2021 nml repeat q2 years Bone Density:2018 osteopenia. Vit D  Recheck in 5  years. Colon: Colonoscopy: 02/2015 hemorrhoids are cause of positive IFOB, repeat colonoscopy  not indicated. Smoking Status: none ETOH/ drug use: none She has advance directives.  hep C: due  Problem List Items Addressed This Visit     Asthma, moderate persistent    Stable, chronic.  Continue current medication.         Essential hypertension, benign    Well controlled at home, element of white coat HTN.       HYPERCHOLESTEROLEMIA    Due for re-eval      POST-POLIO SYNDROME    Possibly effective voice and hoarseness.Marland Kitchen offered referral to ENT to eval for other cause of hoarseness, she will consider.      Other Visit Diagnoses     Medicare annual wellness visit, subsequent    -  Primary   Need for hepatitis C screening test       Vitamin D deficiency            Orders Released This  Visit (4)                              Comprehensive metabolic panel - Released 11/01/2021 10:44 AM                         Routine, Clinic Collect                               Hepatitis C antibody - Released 11/01/2021 10:44 AM                         Routine, Clinic Collect                       Lipid panel - Released 11/01/2021 10:44 AM                         Routine, Clinic Collect               VITAMIN D 25 Hydroxy (Vit-D Deficiency, Fractures) - Released 11/01/2021 10:44 AM                         Routine, Clinic Collect     Eliezer Lofts, MD

## 2021-11-02 LAB — HEPATITIS C ANTIBODY
Hepatitis C Ab: NONREACTIVE
SIGNAL TO CUT-OFF: <0.02 (ref <1.00)

## 2021-11-08 ENCOUNTER — Telehealth: Payer: Self-pay | Admitting: Family Medicine

## 2021-11-08 NOTE — Telephone Encounter (Signed)
Pt called stating that she wanted Dr Ermalene Searing to know that she is interested in taking a non stating medication. Please advise.

## 2021-11-13 MED ORDER — EZETIMIBE 10 MG PO TABS: 10.0000 mg | ORAL_TABLET | Freq: Every day | ORAL | 11 refills | Status: DC

## 2021-11-13 NOTE — Addendum Note (Signed)
Addended by: Kerby Nora E on: 11/13/2021 11:14 AM   Modules accepted: Orders

## 2021-11-13 NOTE — Telephone Encounter (Signed)
See below

## 2021-11-13 NOTE — Telephone Encounter (Signed)
I will send in zetia.Marland Kitchen schedule recheck CMET and lipids in 3 months.

## 2021-11-13 NOTE — Telephone Encounter (Signed)
Left message for Courtney Chandler that Dr. Ermalene Searing sent her in a Rx for Zetia, take one tablet daily, to [facility] on [address].  I ask that she call the office back to schedule a lab only appointment in 3 months for CMET and Lipid.

## 2021-12-24 ENCOUNTER — Other Ambulatory Visit: Payer: Self-pay | Admitting: Family Medicine

## 2022-01-07 ENCOUNTER — Encounter: Payer: Self-pay | Admitting: Emergency Medicine

## 2022-01-07 ENCOUNTER — Ambulatory Visit
Admission: EM | Admit: 2022-01-07 | Discharge: 2022-01-07 | Disposition: A | Discharge status: Home / Self Care | Payer: Medicare Other | Attending: Internal Medicine | Admitting: Internal Medicine

## 2022-01-07 ENCOUNTER — Other Ambulatory Visit: Payer: Self-pay

## 2022-01-07 DIAGNOSIS — K143 Hypertrophy of tongue papillae: Secondary | ICD-10-CM | POA: Diagnosis not present

## 2022-01-07 DIAGNOSIS — J029 Acute pharyngitis, unspecified: Secondary | ICD-10-CM | POA: Diagnosis not present

## 2022-01-07 LAB — POCT RAPID STREP A (OFFICE): Rapid Strep A Screen: NEGATIVE

## 2022-01-07 MED ORDER — PENICILLIN V POTASSIUM 500 MG PO TABS: 500.0000 mg | ORAL_TABLET | Freq: Two times a day (BID) | ORAL | 0 refills | Status: AC

## 2022-01-07 MED ORDER — NYSTATIN 100000 UNIT/ML MT SUSP: 500000.0000 [IU] | Freq: Four times a day (QID) | OROMUCOSAL | 0 refills | Status: DC

## 2022-01-07 NOTE — ED Triage Notes (Signed)
Pt here with sore throat and hoarseness x 2 days. Pt states she has white spots in her throat.

## 2022-01-07 NOTE — ED Provider Notes (Signed)
Courtney Chandler    CSN: [id number] Arrival date & time: 01/07/22  9509      History   Chief Complaint Chief Complaint  Patient presents with   Sore Throat    HPI Courtney Chandler is a 81 y.o. female with hx of ST x 5 days, more horsed than usual x 2 days. Has had some allergies acting up, but denies fever or cough. Yesterday she noticed white spots on her throat, and bumps on her tongue. She uses steroid inhalers and rinses her mouth daily after using them. She has had thrush in the past, and normally sees white patches but has not seen that this time.     Past Medical History:  Diagnosis Date   Allergy    Anxiety    associated with dyphagia   Arthritis    osteo of left hip   Asthma    moderate persistant   Complication of anesthesia    extended anesthesia effect because of post polio syndrome   Deviated septum    2002   Hypertension    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   Osteophyte of cervical spine    pushing on esoghagus causing dysphagia   Post-polio syndrome    Scarlet fever    Sleep apnea    severe - CPAP   Wears dentures    lower partial    Patient Active Problem List   Diagnosis Date Noted   Statins contraindicated 10/31/2020   Irritation of oral cavity 07/28/2020   Hx of acute pancreatitis 10/14/2017   Right foot drop 10/14/2017   Counseling regarding end of life decision making 10/03/2014   DNR (do not resuscitate) 10/03/2014   HYPERCHOLESTEROLEMIA 03/29/2008   VAGINITIS, ATROPHIC 12/03/2007   Osteopenia 12/03/2007   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   Essential hypertension, benign 10/02/2007   EXTERNAL HEMORRHOIDS 10/02/2007   ALLERGIC RHINITIS 10/02/2007   Asthma, moderate persistent 10/02/2007   HIATAL HERNIA 10/02/2007   OSTEOARTHRITIS 10/02/2007   Obstructive sleep apnea syndrome 10/02/2007    Past Surgical History:  Procedure Laterality Date   APPENDECTOMY     CATARACT EXTRACTION W/PHACO Left 02/07/2016    Procedure: CATARACT EXTRACTION PHACO AND INTRAOCULAR LENS PLACEMENT (IOC);  Surgeon: Lockie Mola, MD;  Location: [facility];  Service: Ophthalmology;  Laterality: Left;  CPAP   CATARACT EXTRACTION W/PHACO Right 03/06/2016   Procedure: CATARACT EXTRACTION PHACO AND INTRAOCULAR LENS PLACEMENT (IOC);  Surgeon: Lockie Mola, MD;  Location: [facility];  Service: Ophthalmology;  Laterality: Right;  CPAP   CHOLECYSTECTOMY     COLONOSCOPY WITH ESOPHAGOGASTRODUODENOSCOPY (EGD)     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      Home Medications    Prior to Admission medications   Medication Sig Start Date End Date Taking? Authorizing Provider  nystatin (MYCOSTATIN) 100000 UNIT/ML suspension Take 5 mLs (500,000 Units total) by mouth 4 (four) times daily. For 7 days, then as needed for recurrence 01/07/22  Yes Rodriguez-Southworth, Nettie Elm, PA-C  penicillin v potassium (VEETID) 500 MG tablet Take 1 tablet (500 mg total) by mouth 2 (two) times daily for 10 days. 01/07/22 01/17/22 Yes Rodriguez-Southworth, Nettie Elm, PA-C  acetaminophen (TYLENOL) 500 MG tablet Take 1,000 mg by mouth as needed.    [provider]  Calcium Carbonate-Vitamin D 600-400 MG-UNIT tablet Take 1 tablet by mouth daily.  [provider]  cetirizine (ZYRTEC) 10 MG tablet Take 10 mg by mouth daily.    [provider]  Dextromethorphan-Guaifenesin 20-400 MG TABS Take by mouth 4 (four) times daily as needed.    [provider]  DOCOSAHEXAENOIC ACID PO Take by mouth.    [provider]  Esomeprazole Magnesium (NEXIUM PO) Take 20 mg by mouth daily.     [provider]  ezetimibe (ZETIA) 10 MG tablet Take 1 tablet (10 mg total) by mouth daily. 11/13/21   Bedsole, Amy E, MD  fluconazole (DIFLUCAN) 150 MG tablet Take 1 tablet (150 mg total) by mouth daily. 07/19/20   Bedsole, Amy E, MD   Fluticasone-Salmeterol (ADVAIR) 500-50 MCG/DOSE AEPB INHALE 1 DOSE BY MOUTH EVERY 12 HOURS 01/23/21   Bedsole, Amy E, MD  Glucosamine-Chondroitin-MSM 500-400-300 MG TABS Take 1 tablet by mouth 2 (two) times daily.    [provider]  latanoprost (XALATAN) 0.005 % ophthalmic solution Place 1 drop into the left eye at bedtime.  09/22/13   [provider]  Melatonin 5 MG TABS Take 20-40 mg by mouth at bedtime.     [provider]  montelukast (SINGULAIR) 10 MG tablet TAKE 1 TABLET BY MOUTH AT BEDTIME 12/25/21   Bedsole, Amy E, MD  Multiple Vitamins-Minerals (MULTIVITAMIN WITH MINERALS) tablet Take 1 tablet by mouth daily.    [provider]  OVER THE COUNTER MEDICATION CBD- sublingual for sleep every night and topically as needed for pain    [provider]  PROVENTIL HFA 108 (90 Base) MCG/ACT inhaler Inhale 2 puffs into the lungs every 6 (six) hours as needed for wheezing or shortness of breath. 09/25/21   Excell Seltzer, MD  Sennosides-Docusate Sodium 8.6-50 MG CAPS Take 3 capsules by mouth daily.     [provider]    Family History Family History  Problem Relation Age of Onset   Cancer Mother        lung   Alcohol abuse Mother    Diabetes Father    Alcohol abuse Father    Cancer Maternal Grandmother        colon   Cancer Maternal Grandfather        colon   Diabetes Paternal Grandmother     Social History Social History   Tobacco Use   Smoking status: Former    Packs/day: 0.50    Years: 8.00    Pack years: 4.00    Types: Cigarettes    Quit date: 1988    Years since quitting: 35.1   Smokeless tobacco: Never   Tobacco comments:    quit 1981  Vaping Use   Vaping Use: Never used  Substance Use Topics   Alcohol use: Yes    Comment: very rare wine (2x/yr)   Drug use: No     Allergies   Statins, Beta adrenergic blockers, Codeine, and Erythromycin   Review of Systems Review of Systems  Constitutional:  Negative for  chills and fever.  HENT:  Positive for postnasal drip, rhinorrhea, sneezing, sore throat and voice change. Negative for congestion, ear discharge, ear pain and trouble swallowing.   Eyes:  Negative for discharge.  Respiratory:  Negative for cough.   Musculoskeletal:  Negative for myalgias.  Skin:  Negative for rash.  Neurological:  Negative for headaches.  Hematological:  Negative for adenopathy.    Physical Exam Triage Vital Signs ED Triage Vitals  Enc Vitals Group     BP 01/07/22 0900 (!) 156/69  Pulse Rate 01/07/22 0900 75     Resp 01/07/22 0900 16     Temp 01/07/22 0900 98.1 F (36.7 C)     Temp Source 01/07/22 0900 Oral     SpO2 01/07/22 0900 96 %     Weight --      Height --      Head Circumference --      Peak Flow --      Pain Score 01/07/22 0845 6     Pain Loc --      Pain Edu? --      Excl. in GC? --    No data found.  Updated Vital Signs BP (!) 156/69 (BP Location: Left Arm)    Pulse 75    Temp 98.1 F (36.7 C) (Oral)    Resp 16    SpO2 96%   Visual Acuity Right Eye Distance:   Left Eye Distance:   Bilateral Distance:    Right Eye Near:   Left Eye Near:    Bilateral Near:     Physical Exam Vitals and nursing note reviewed.  Constitutional:      General: She is not in acute distress.    Appearance: She is not toxic-appearing.  HENT:     Right Ear: External ear normal.     Left Ear: External ear normal.     Nose: Nose normal.     Mouth/Throat:     Mouth: Mucous membranes are moist.     Tonsils: 0 on the right. 0 on the left.     Comments: Mild erythema of pharynx noted, no exudate. Uvula is mid line. She has some prominent tongue papilla, and her tongue is red has the look of a strawberry Eyes:     General: No scleral icterus.    Conjunctiva/sclera: Conjunctivae normal.  Pulmonary:     Effort: Pulmonary effort is normal.  Musculoskeletal:        General: Normal range of motion.     Cervical back: Neck supple.  Lymphadenopathy:      Cervical: No cervical adenopathy.  Skin:    General: Skin is warm and dry.  Neurological:     Mental Status: She is alert and oriented to person, place, and time.     Gait: Gait normal.  Psychiatric:        Mood and Affect: Mood normal.        Behavior: Behavior normal.        Thought Content: Thought content normal.        Judgment: Judgment normal.     UC Treatments / Results  Labs (all labs ordered are listed, but only abnormal results are displayed) Labs Reviewed  POCT RAPID STREP A (OFFICE) - Normal  CULTURE, GROUP A STREP [facility])  Rapid strep is neg  EKG   Radiology No results found.  Procedures Procedures (including critical care time)  Medications Ordered in UC Medications - No data to display  Initial Impression / Assessment and Plan / UC Course  I have reviewed the triage vital signs and the nursing notes. Pertinent lab results that were available during my care of the patient were reviewed by me and considered in my medical decision making (see chart for details). Due to the strawberry tongue look, I started her on Penicillin til the culture comes back. If negative she was told to d/ the [facility], and may try the Nystatin oral swish which I handed her an rx for to take with her.  Final Clinical Impressions(s) / UC Diagnoses   Final diagnoses:  Acute pharyngitis, unspecified etiology  Strawberry tongue     Discharge Instructions      If your throat culture is negative, then stop the penicillin and start the nystatin oral swish      ED Prescriptions     Medication Sig Dispense Auth. Provider   penicillin v potassium (VEETID) 500 MG tablet Take 1 tablet (500 mg total) by mouth 2 (two) times daily for 10 days. 20 tablet Rodriguez-Southworth, Nettie ElmSylvia, PA-C   nystatin (MYCOSTATIN) 100000 UNIT/ML suspension Take 5 mLs (500,000 Units total) by mouth 4 (four) times daily. For 7 days, then as needed for recurrence 473 mL Rodriguez-Southworth, Nettie ElmSylvia, PA-C       PDMP not reviewed this encounter.   Garey HamRodriguez-Southworth, Jamela Cumbo, [place]-C 01/07/22 [phone]

## 2022-01-07 NOTE — Discharge Instructions (Addendum)
If your throat culture is negative, then stop the penicillin and start the nystatin oral swish

## 2022-01-09 LAB — CULTURE, GROUP A STREP (THRC)

## 2022-01-19 ENCOUNTER — Other Ambulatory Visit: Payer: Self-pay | Admitting: Family Medicine

## 2022-01-28 DIAGNOSIS — H401121 Primary open-angle glaucoma, left eye, mild stage: Secondary | ICD-10-CM | POA: Diagnosis not present

## 2022-01-31 ENCOUNTER — Telehealth: Payer: Self-pay | Admitting: Family Medicine

## 2022-01-31 DIAGNOSIS — E7800 Pure hypercholesterolemia, unspecified: Secondary | ICD-10-CM

## 2022-01-31 NOTE — Telephone Encounter (Signed)
-----   Message from Ronalee Red, RT sent at 01/30/2022  3:04 PM EDT ----- ?Regarding: Lab order for Tuesday, 02/12/22 appt ?Lab orders for appt on 02/12/22, please.  Thanks,  Jae Dire ? ?

## 2022-02-04 ENCOUNTER — Other Ambulatory Visit: Payer: Self-pay | Admitting: Family Medicine

## 2022-02-04 DIAGNOSIS — H401121 Primary open-angle glaucoma, left eye, mild stage: Secondary | ICD-10-CM | POA: Diagnosis not present

## 2022-02-12 ENCOUNTER — Other Ambulatory Visit (INDEPENDENT_AMBULATORY_CARE_PROVIDER_SITE_OTHER): Payer: Medicare Other

## 2022-02-12 DIAGNOSIS — E7800 Pure hypercholesterolemia, unspecified: Secondary | ICD-10-CM

## 2022-02-12 LAB — COMPREHENSIVE METABOLIC PANEL
ALT: 12 U/L (ref 0–35)
AST: 19 U/L (ref 0–37)
Albumin: 4.3 g/dL (ref 3.5–5.2)
Alkaline Phosphatase: 43 U/L (ref 39–117)
BUN: 11 mg/dL (ref 6–23)
CO2: 25 mEq/L (ref 19–32)
Calcium: 9.8 mg/dL (ref 8.4–10.5)
Chloride: 105 mEq/L (ref 96–112)
Creatinine, Ser: 0.84 mg/dL (ref 0.40–1.20)
GFR: 65.94 mL/min (ref >60.00)
Glucose, Bld: 86 mg/dL (ref 70–99)
Potassium: 3.7 mEq/L (ref 3.5–5.1)
Sodium: 139 mEq/L (ref 135–145)
Total Bilirubin: 0.7 mg/dL (ref 0.2–1.2)
Total Protein: 7.2 g/dL (ref 6.0–8.3)

## 2022-02-12 LAB — LIPID PANEL
Cholesterol: 188 mg/dL (ref 0–200)
HDL: 82.3 mg/dL (ref >39.00)
LDL Cholesterol: 78 mg/dL (ref 0–99)
NonHDL: 106.08
Total CHOL/HDL Ratio: 2
Triglycerides: 142 mg/dL (ref 0.0–149.0)
VLDL: 28.4 mg/dL (ref 0.0–40.0)

## 2022-03-14 ENCOUNTER — Ambulatory Visit (INDEPENDENT_AMBULATORY_CARE_PROVIDER_SITE_OTHER): Payer: Medicare Other | Admitting: Pulmonary Disease

## 2022-03-14 ENCOUNTER — Ambulatory Visit
Admission: RE | Admit: 2022-03-14 | Discharge: 2022-03-14 | Disposition: A | Discharge status: Home / Self Care | Payer: Medicare Other | Source: Ambulatory Visit | Attending: Pulmonary Disease | Admitting: Pulmonary Disease

## 2022-03-14 ENCOUNTER — Other Ambulatory Visit
Admission: RE | Admit: 2022-03-14 | Discharge: 2022-03-14 | Disposition: A | Discharge status: Home / Self Care | Payer: Medicare Other | Source: Ambulatory Visit | Attending: Pulmonary Disease | Admitting: Pulmonary Disease

## 2022-03-14 ENCOUNTER — Encounter: Payer: Self-pay | Admitting: Pulmonary Disease

## 2022-03-14 VITALS — BP 144/60 | HR 96 | Temp 98.0°F | Ht 60.0 in | Wt 115.4 lb

## 2022-03-14 DIAGNOSIS — G14 Postpolio syndrome: Secondary | ICD-10-CM

## 2022-03-14 DIAGNOSIS — J4550 Severe persistent asthma, uncomplicated: Secondary | ICD-10-CM

## 2022-03-14 DIAGNOSIS — G4733 Obstructive sleep apnea (adult) (pediatric): Secondary | ICD-10-CM | POA: Diagnosis not present

## 2022-03-14 DIAGNOSIS — R0609 Other forms of dyspnea: Secondary | ICD-10-CM

## 2022-03-14 DIAGNOSIS — R0602 Shortness of breath: Secondary | ICD-10-CM | POA: Diagnosis not present

## 2022-03-14 DIAGNOSIS — J9811 Atelectasis: Secondary | ICD-10-CM | POA: Diagnosis not present

## 2022-03-14 LAB — CBC WITH DIFFERENTIAL/PLATELET
Abs Immature Granulocytes: 0.02 10*3/uL (ref 0.00–0.07)
Basophils Absolute: 0.1 10*3/uL (ref 0.0–0.1)
Basophils Relative: 1 %
Eosinophils Absolute: 0.1 10*3/uL (ref 0.0–0.5)
Eosinophils Relative: 1 %
HCT: 42.4 % (ref 36.0–46.0)
Hemoglobin: 14.1 g/dL (ref 12.0–15.0)
Immature Granulocytes: 0 %
Lymphocytes Relative: 26 %
Lymphs Abs: 1.7 10*3/uL (ref 0.7–4.0)
MCH: 32.5 pg (ref 26.0–34.0)
MCHC: 33.3 g/dL (ref 30.0–36.0)
MCV: 97.7 fL (ref 80.0–100.0)
Monocytes Absolute: 0.6 10*3/uL (ref 0.1–1.0)
Monocytes Relative: 9 %
Neutro Abs: 4.2 10*3/uL (ref 1.7–7.7)
Neutrophils Relative %: 63 %
Platelets: 284 10*3/uL (ref 150–400)
RBC: 4.34 MIL/uL (ref 3.87–5.11)
RDW: 13 % (ref 11.5–15.5)
WBC: 6.6 10*3/uL (ref 4.0–10.5)
nRBC: 0 % (ref 0.0–0.2)

## 2022-03-14 LAB — COMPREHENSIVE METABOLIC PANEL
ALT: 15 U/L (ref 0–44)
AST: 22 U/L (ref 15–41)
Albumin: 4.1 g/dL (ref 3.5–5.0)
Alkaline Phosphatase: 44 U/L (ref 38–126)
Anion gap: 7 (ref 5–15)
BUN: 15 mg/dL (ref 8–23)
CO2: 26 mmol/L (ref 22–32)
Calcium: 10.1 mg/dL (ref 8.9–10.3)
Chloride: 105 mmol/L (ref 98–111)
Creatinine, Ser: 0.7 mg/dL (ref 0.44–1.00)
GFR, Estimated: >60 mL/min (ref >60)
Glucose, Bld: 108 mg/dL — ABNORMAL HIGH (ref 70–99)
Potassium: 3.9 mmol/L (ref 3.5–5.1)
Sodium: 138 mmol/L (ref 135–145)
Total Bilirubin: 0.9 mg/dL (ref 0.3–1.2)
Total Protein: 7.4 g/dL (ref 6.5–8.1)

## 2022-03-14 NOTE — Patient Instructions (Signed)
Will have your CPAP setting increased ? ?Lab tests and chest xray today ? ?Will arrange for pulmonary function test ? ?Follow up in 3 months ?

## 2022-03-14 NOTE — Progress Notes (Signed)
? ?[facility] ? ?Chief Complaint  ?Patient presents with  ? Follow-up  ?  Would like pressure increased. Wearing cpap avg 4-6hr nightly.   ? ? ?Past Surgical History:  ?She  has a past surgical history that includes Foot Fusion (1955); Nasal septum surgery; Appendectomy; Cholecystectomy; Colonoscopy with esophagogastroduodenoscopy (egd); Cataract extraction w/PHACO (Left, 02/07/2016); and Cataract extraction w/PHACO (Right, 03/06/2016). ? ?Past Medical History:  ?HTN, Osteopenia, Anxiety, Osteoarthritis, Deviated nasal septum, Post polio syndrome, Scarlet fever ? ?Constitutional:  ?BP (!) 144/60 (BP Location: Left Arm, Cuff Size: Normal)   Pulse 96   Temp 98 ?F (36.7 ?C) (Temporal)   Ht 5' (1.524 m)   Wt 115 lb 6.4 oz (52.3 kg)   SpO2 95%   BMI 22.54 kg/m?  ? ?Brief Summary:  ?Courtney Chandler is a 81 y.o. female former smoker with obstructive sleep apnea and asthma.  She is a retired Engineer, civil (consulting). ?  ? ? ? ?Subjective:  ? ?She had initially sleep study in [place].  Found to have severe sleep apnea.  She had polio as a child and has asthma since childhood.   ? ?She feels the CPAP settings are too low.  She has been using auto CPAP 16 to 20 cm H2O.  She uses a full face mask, and mask fits well.  She wakes up feeling like she isn't getting air and feels panicked.  She feels her muscles are getting weaker from polio.   ? ?She has been using albuterol once or twice per day.  She has occasional cough and dyspnea.  She is on high dose advair and continues singulair. ? ?Physical Exam:  ? ?Appearance - well kempt  ? ?ENMT - no sinus tenderness, no oral exudate, no LAN, Mallampati 3 airway, no stridor, over bite ? ?Respiratory - equal breath sounds bilaterally, no wheezing or rales ? ?CV - s1s2 regular rate and rhythm, no murmurs ? ?Ext - no clubbing, no edema ? ?Skin - no rashes ? ?Psych - normal mood and affect ?  ?Pulmonary testing:  ? ? ?Chest Imaging:  ? ? ?Sleep Tests:   ?PSG 08/07/05 >> RDI 67.9, SpO2 low 61% ?Auto CPAP 02/10/22 to 03/11/22 >> used on 30 of 30 nights with average 5 hrs 40 min.  Average AHI 5 with median CPAP 16 and 95 th percentile CPAP 19 cm H2O ? ?Social History:  ?She  reports that she quit smoking about 35 years ago. Her smoking use included cigarettes. She has a 4.00 pack-year smoking history. She has never used smokeless tobacco. She reports current alcohol use. She reports that she does not use drugs. ? ?Family History:  ?Her family history includes Alcohol abuse in her father and mother; Cancer in her maternal grandfather, maternal grandmother, and mother; Diabetes in her father and paternal grandmother. ?  ? ? ?Assessment/Plan:  ? ?Dyspnea on exertion with severe, persistent asthma and history of polio. ?- will arrange for CMET, CBC with diff, IgE, Chest xray, and Pulmonary function test ?- continue advair 500 one puff bid and singulair 10 mg qhs  ?- prn albuterol ? ?Obstructive sleep apnea. ?- she is compliant with CPAP and reports benefit from therapy ?- she uses [employer] for her DME ?- will change CPAP to 20 cm H2O ?- if she continues to have difficulty, then she would need an in lab titration study to determine whether she needs to be transitioned to Bipap ? ?Time Spent Involved in Patient Care on  Day of Examination:  ?35 minutes ? ?Follow up:  ? ?Patient Instructions  ?Will have your CPAP setting increased ? ?Lab tests and chest xray today ? ?Will arrange for pulmonary function test ? ?Follow up in 3 months ? ?Medication List:  ? ?Allergies as of 03/14/2022   ? ?   Reactions  ? Statins   ?  pancreatitis  ? Beta Adrenergic Blockers   ? Asthma exacerbation  ? Codeine   ? REACTION: Nausea and vomiting  ? Erythromycin   ? REACTION: Nausea and vomiting  ? ?  ? ?  ?Medication List  ?  ? ?  ? Accurate as of Mar 14, 2022 10:41 AM. If you have any questions, ask your nurse or doctor.  ?  ?  ? ?  ? ?STOP taking these medications   ? ?fluconazole 150 MG tablet ?Commonly  known as: DIFLUCAN ?Stopped by: Coralyn Helling, MD ?  ? ?  ? ?TAKE these medications   ? ?acetaminophen 500 MG tablet ?Commonly known as: TYLENOL ?Take 1,000 mg by mouth as needed. ?  ?albuterol 108 (90 Base) MCG/ACT inhaler ?Commonly known as: VENTOLIN HFA ?INHALE 2 PUFFS BY MOUTH EVERY 6 HOURS AS NEEDED FOR WHEEZING AND FOR SHORTNESS OF BREATH ?  ?Calcium Carbonate-Vitamin D 600-400 MG-UNIT tablet ?Take 1 tablet by mouth daily. ?  ?cetirizine 10 MG tablet ?Commonly known as: ZYRTEC ?Take 10 mg by mouth daily. ?  ?Dextromethorphan-guaiFENesin 20-400 MG Tabs ?Take by mouth 4 (four) times daily as needed. ?  ?DOCOSAHEXAENOIC ACID PO ?Take by mouth. ?  ?ezetimibe 10 MG tablet ?Commonly known as: Zetia ?Take 1 tablet (10 mg total) by mouth daily. ?  ?fluticasone-salmeterol 500-50 MCG/ACT Aepb ?Commonly known as: ADVAIR ?INHALE 1 DOSE BY MOUTH EVERY 12 HOURS ?  ?Glucosamine-Chondroitin-MSM 500-400-300 MG Tabs ?Take 1 tablet by mouth 2 (two) times daily. ?  ?latanoprost 0.005 % ophthalmic solution ?Commonly known as: XALATAN ?Place 1 drop into the left eye at bedtime. ?  ?melatonin 5 MG Tabs ?Take 20-40 mg by mouth at bedtime. ?  ?montelukast 10 MG tablet ?Commonly known as: SINGULAIR ?TAKE 1 TABLET BY MOUTH AT BEDTIME ?  ?multivitamin with minerals tablet ?Take 1 tablet by mouth daily. ?  ?NEXIUM PO ?Take 20 mg by mouth daily. ?  ?nystatin 100000 UNIT/ML suspension ?Commonly known as: MYCOSTATIN ?Take 5 mLs (500,000 Units total) by mouth 4 (four) times daily. For 7 days, then as needed for recurrence ?  ?OVER THE COUNTER MEDICATION ?CBD- sublingual for sleep every night and topically as needed for pain ?  ?Sennosides-Docusate Sodium 8.6-50 MG Caps ?Take 3 capsules by mouth daily. ?  ? ?  ? ? ?Signature:  ?Coralyn Helling, MD ?[facility] ?Pager - [phone] ?03/14/2022, 10:41 AM ?  ? ? ? ? ? ? ? ? ?

## 2022-03-15 DIAGNOSIS — Z20822 Contact with and (suspected) exposure to covid-19: Secondary | ICD-10-CM | POA: Diagnosis not present

## 2022-03-18 DIAGNOSIS — Z20822 Contact with and (suspected) exposure to covid-19: Secondary | ICD-10-CM | POA: Diagnosis not present

## 2022-03-18 LAB — IGE: IgE (Immunoglobulin E), Serum: 15 IU/mL (ref 6–495)

## 2022-05-26 ENCOUNTER — Other Ambulatory Visit: Payer: Self-pay | Admitting: Family Medicine

## 2022-06-20 ENCOUNTER — Ambulatory Visit: Payer: Medicare Other | Attending: Pulmonary Disease

## 2022-06-20 DIAGNOSIS — J4550 Severe persistent asthma, uncomplicated: Secondary | ICD-10-CM | POA: Diagnosis not present

## 2022-06-20 DIAGNOSIS — G14 Postpolio syndrome: Secondary | ICD-10-CM | POA: Diagnosis not present

## 2022-06-20 DIAGNOSIS — R0609 Other forms of dyspnea: Secondary | ICD-10-CM | POA: Diagnosis not present

## 2022-06-20 LAB — PULMONARY FUNCTION TEST ARMC ONLY
DL/VA % pred: 52 %
DL/VA: 2.21 ml/min/mmHg/L
DLCO unc % pred: 65 %
DLCO unc: 10.66 ml/min/mmHg
FEF 25-75 Post: 1.92 L/sec
FEF 25-75 Pre: 1.58 L/sec
FEF2575-%Change-Post: 21 %
FEF2575-%Pred-Post: 161 %
FEF2575-%Pred-Pre: 133 %
FEV1-%Change-Post: 5 %
FEV1-%Pred-Post: 143 %
FEV1-%Pred-Pre: 136 %
FEV1-Post: 2.26 L
FEV1-Pre: 2.15 L
FEV1FVC-%Change-Post: 0 %
FEV1FVC-%Pred-Pre: 100 %
FEV6-%Change-Post: 4 %
FEV6-%Pred-Post: 151 %
FEV6-%Pred-Pre: 144 %
FEV6-Post: 3.03 L
FEV6-Pre: 2.9 L
FEV6FVC-%Change-Post: 0 %
FEV6FVC-%Pred-Post: 106 %
FEV6FVC-%Pred-Pre: 106 %
FVC-%Change-Post: 4 %
FVC-%Pred-Post: 142 %
FVC-%Pred-Pre: 135 %
FVC-Post: 3.04 L
FVC-Pre: 2.9 L
Post FEV1/FVC ratio: 74 %
Post FEV6/FVC ratio: 100 %
Pre FEV1/FVC ratio: 74 %
Pre FEV6/FVC Ratio: 100 %
RV % pred: 83 %
RV: 1.83 L
TLC % pred: 110 %
TLC: 4.91 L

## 2022-07-04 ENCOUNTER — Encounter: Payer: Self-pay | Admitting: Pulmonary Disease

## 2022-07-04 ENCOUNTER — Ambulatory Visit (INDEPENDENT_AMBULATORY_CARE_PROVIDER_SITE_OTHER): Payer: Medicare Other | Admitting: Pulmonary Disease

## 2022-07-04 VITALS — BP 138/78 | HR 80 | Temp 98.0°F | Ht 60.0 in | Wt 116.8 lb

## 2022-07-04 DIAGNOSIS — G14 Postpolio syndrome: Secondary | ICD-10-CM | POA: Diagnosis not present

## 2022-07-04 DIAGNOSIS — G4733 Obstructive sleep apnea (adult) (pediatric): Secondary | ICD-10-CM

## 2022-07-04 DIAGNOSIS — J4550 Severe persistent asthma, uncomplicated: Secondary | ICD-10-CM

## 2022-07-04 MED ORDER — TRELEGY ELLIPTA 200-62.5-25 MCG/ACT IN AEPB: 1.0000 | INHALATION_SPRAY | Freq: Every day | RESPIRATORY_TRACT | 5 refills | Status: DC

## 2022-07-04 MED ORDER — TRELEGY ELLIPTA 200-62.5-25 MCG/ACT IN AEPB: 1.0000 | INHALATION_SPRAY | Freq: Every day | RESPIRATORY_TRACT | 0 refills | Status: DC

## 2022-07-04 NOTE — Progress Notes (Signed)
[facility]  Chief Complaint  Patient presents with   Follow-up    Wearing cpap 4-5hr nightly- pressure and mask is okay.     Past Surgical History:  She  has a past surgical history that includes Foot Fusion (1955); Nasal septum surgery; Appendectomy; Cholecystectomy; Colonoscopy with esophagogastroduodenoscopy (egd); Cataract extraction w/PHACO (Left, 02/07/2016); and Cataract extraction w/PHACO (Right, 03/06/2016).  Past Medical History:  HTN, Osteopenia, Anxiety, Osteoarthritis, Deviated nasal septum, Post polio syndrome, Scarlet fever  Constitutional:  BP 138/78 (BP Location: Left Arm, Cuff Size: Normal)   Pulse 80   Temp 98 F (36.7 C) (Temporal)   Ht 5' (1.524 m)   Wt 116 lb 12.8 oz (53 kg)   SpO2 98%   BMI 22.81 kg/m   Brief Summary:  Courtney Chandler is a 81 y.o. female former smoker with obstructive sleep apnea and asthma.  She is a retired Engineer, civil (consulting).      Subjective:   PFT showed mild diffusion defect.  Chest xray showed mild bronchitic changes.  She uses CPAP nightly.  She doesn't have any issues with her mask.  She saw something on her machine that made her think someone was manipulating her CPAP.  She gets dizzy spells.  Still gets winded with walking.  Has some cough and congestion.  Physical Exam:   Appearance - well kempt   ENMT - no sinus tenderness, no oral exudate, no LAN, Mallampati 2 airway, no stridor  Respiratory - equal breath sounds bilaterally, no wheezing or rales  CV - s1s2 regular rate and rhythm, no murmurs  Ext - no clubbing, no edema  Skin - no rashes  Psych - normal mood and affect    Pulmonary testing:  IgE 03/14/22 >> 15 PFT 06/20/22 >> FEV1 2.26 (143%), FEV1 74, TLC 4.91 (110%), DLCO 65%  Chest Imaging:    Sleep Tests:  PSG 08/07/05 >> RDI 67.9, SpO2 low 61% Auto CPAP 06/03/22 to 07/02/22 >> used on 30 of 30 nights with average 6 hrs 3 min.  Average AHI 3.2 with CPAP 20 cm  H2O  Social History:  She  reports that she quit smoking about 35 years ago. Her smoking use included cigarettes. She has a 4.00 pack-year smoking history. She has never used smokeless tobacco. She reports current alcohol use. She reports that she does not use drugs.  Family History:  Her family history includes Alcohol abuse in her father and mother; Cancer in her maternal grandfather, maternal grandmother, and mother; Diabetes in her father and paternal grandmother.     Assessment/Plan:   Dyspnea on exertion with severe, persistent asthma and history of polio. - will have her try trelegy 200 one puff daily in place of advair - continue singulair - prn albuterol - if dyspnea persists, then she might need HRCT chest to assess cause of decreased diffusing capacity further and/or she might need further cardiac assessment  Obstructive sleep apnea. - she is compliant with CPAP and reports benefit from therapy - she uses [employer] for her DME - continue CPAP 20 cm H2O - current CPAP ordered 11/15/20 - if she has difficulty tolerating high CPAP pressure settings, then she might need a titration study to determine if she is a candidate for Bipap  Time Spent Involved in Patient Care on Day of Examination:  37 minutes  Follow up:   Patient Instructions  Trelegy one puff daily, and rinse your mouth after each use  Stop advair  Follow  up in 2 months  Medication List:   Allergies as of 07/04/2022       Reactions   Statins     pancreatitis   Beta Adrenergic Blockers    Asthma exacerbation   Codeine    REACTION: Nausea and vomiting   Erythromycin    REACTION: Nausea and vomiting        Medication List        Accurate as of July 04, 2022 12:45 PM. If you have any questions, ask your nurse or doctor.          STOP taking these medications    fluticasone-salmeterol 500-50 MCG/ACT Aepb Commonly known as: ADVAIR Stopped by: Coralyn Helling, MD   nystatin 100000 UNIT/ML  suspension Commonly known as: MYCOSTATIN Stopped by: Coralyn Helling, MD       TAKE these medications    acetaminophen 500 MG tablet Commonly known as: TYLENOL Take 1,000 mg by mouth as needed.   albuterol 108 (90 Base) MCG/ACT inhaler Commonly known as: VENTOLIN HFA INHALE 2 PUFFS BY MOUTH EVERY 6 HOURS AS NEEDED FOR WHEEZING AND FOR SHORTNESS OF BREATH   Calcium Carbonate-Vitamin D 600-400 MG-UNIT tablet Take 1 tablet by mouth daily.   cetirizine 10 MG tablet Commonly known as: ZYRTEC Take 10 mg by mouth daily.   Dextromethorphan-guaiFENesin 20-400 MG Tabs Take by mouth 4 (four) times daily as needed.   DOCOSAHEXAENOIC ACID PO Take by mouth.   ezetimibe 10 MG tablet Commonly known as: Zetia Take 1 tablet (10 mg total) by mouth daily.   Glucosamine-Chondroitin-MSM 500-400-300 MG Tabs Take 1 tablet by mouth 2 (two) times daily.   latanoprost 0.005 % ophthalmic solution Commonly known as: XALATAN Place 1 drop into the left eye at bedtime.   melatonin 5 MG Tabs Take 20-40 mg by mouth at bedtime.   montelukast 10 MG tablet Commonly known as: SINGULAIR TAKE 1 TABLET BY MOUTH AT BEDTIME   multivitamin with minerals tablet Take 1 tablet by mouth daily.   NEXIUM PO Take 20 mg by mouth daily.   OVER THE COUNTER MEDICATION CBD- sublingual for sleep every night and topically as needed for pain   Sennosides-Docusate Sodium 8.6-50 MG Caps Take 3 capsules by mouth daily.   Trelegy Ellipta 200-62.5-25 MCG/ACT Aepb Generic drug: Fluticasone-Umeclidin-Vilant Inhale 1 puff into the lungs daily in the afternoon. Started by: Coralyn Helling, MD   Trelegy Ellipta 200-62.5-25 MCG/ACT Aepb Generic drug: Fluticasone-Umeclidin-Vilant Inhale 1 puff into the lungs daily. Started by: Coralyn Helling, MD        Signature:  Coralyn Helling, MD [facility] Pager - [phone] 07/04/2022, 12:45 PM

## 2022-07-04 NOTE — Patient Instructions (Addendum)
Trelegy one puff daily, and rinse your mouth after each use  Stop advair  Follow up in 2 months

## 2022-08-06 DIAGNOSIS — Z961 Presence of intraocular lens: Secondary | ICD-10-CM | POA: Diagnosis not present

## 2022-08-31 DIAGNOSIS — Z23 Encounter for immunization: Secondary | ICD-10-CM | POA: Diagnosis not present

## 2022-09-16 ENCOUNTER — Encounter: Payer: Self-pay | Admitting: Pulmonary Disease

## 2022-09-16 ENCOUNTER — Ambulatory Visit (INDEPENDENT_AMBULATORY_CARE_PROVIDER_SITE_OTHER): Payer: Medicare Other | Admitting: Pulmonary Disease

## 2022-09-16 VITALS — BP 140/78 | HR 87 | Temp 98.0°F | Ht 60.0 in | Wt 119.0 lb

## 2022-09-16 DIAGNOSIS — G4733 Obstructive sleep apnea (adult) (pediatric): Secondary | ICD-10-CM | POA: Diagnosis not present

## 2022-09-16 DIAGNOSIS — J4550 Severe persistent asthma, uncomplicated: Secondary | ICD-10-CM | POA: Diagnosis not present

## 2022-09-16 NOTE — Patient Instructions (Signed)
Follow up in 6 months 

## 2022-09-16 NOTE — Progress Notes (Signed)
[facility]  Chief Complaint  Patient presents with   Follow-up    Wearing cpap nightly-pressure and mask is okay.     Past Surgical History:  She  has a past surgical history that includes Foot Fusion (1955); Nasal septum surgery; Appendectomy; Cholecystectomy; Colonoscopy with esophagogastroduodenoscopy (egd); Cataract extraction w/PHACO (Left, 02/07/2016); and Cataract extraction w/PHACO (Right, 03/06/2016).  Past Medical History:  HTN, Osteopenia, Anxiety, Osteoarthritis, Deviated nasal septum, Post polio syndrome, Scarlet fever  Constitutional:  BP (!) 140/78 (BP Location: Left Arm, Cuff Size: Normal)   Pulse 87   Temp 98 F (36.7 C) (Temporal)   Ht 5' (1.524 m)   Wt 119 lb (54 kg)   SpO2 98%   BMI 23.24 kg/m   Brief Summary:  Courtney Chandler is a 81 y.o. female former smoker with obstructive sleep apnea and asthma.  She is a retired Marine scientist.      Subjective:   She is feeling much better.  She feels trelegy has helped.  Not having cough, wheeze, or sputum.  Activity level better.  She is not having as much trouble with brain fog either.  Uses CPAP nightly.  No issues with mask fit or pressure setting.  Asking about RSV vaccine.  Physical Exam:   Appearance - well kempt   ENMT - no sinus tenderness, no oral exudate, no LAN, Mallampati 2 airway, no stridor  Respiratory - equal breath sounds bilaterally, no wheezing or rales  CV - s1s2 regular rate and rhythm, no murmurs  Ext - no clubbing, no edema  Skin - no rashes  Psych - normal mood and affect    Pulmonary testing:  IgE 03/14/22 >> 15 PFT 06/20/22 >> FEV1 2.26 (143%), FEV1 74, TLC 4.91 (110%), DLCO 65%  Chest Imaging:    Sleep Tests:  PSG 08/07/05 >> RDI 67.9, SpO2 low 61% Auto CPAP 08/14/22 to 09/12/22 >> used on 30 of 30 nights with average 5 hrs 56 min.  Average AHI 2.3 with CPAP 20 cm H2O.  Social History:  She  reports that she quit smoking about 43  years ago. Her smoking use included cigarettes. She has a 4.00 pack-year smoking history. She has never used smokeless tobacco. She reports current alcohol use. She reports that she does not use drugs.  Family History:  Her family history includes Alcohol abuse in her father and mother; Cancer in her maternal grandfather, maternal grandmother, and mother; Diabetes in her father and paternal grandmother.     Assessment/Plan:   Dyspnea on exertion with severe, persistent asthma and history of polio. - continue trelegy 200 one puff daily and singulair 10 mg qhs - prn albuterol - advised her to get the RSV vaccine  Obstructive sleep apnea. - she is compliant with CPAP and reports benefit from therapy - she uses [employer] for her DME - current CPAP ordered 11/15/20 - continue CPAP 20 cm H2O  Time Spent Involved in Patient Care on Day of Examination:  26 minutes  Follow up:   Patient Instructions  Follow up in 6 months  Medication List:   Allergies as of 09/16/2022       Reactions   Statins     pancreatitis   Beta Adrenergic Blockers    Asthma exacerbation   Codeine    REACTION: Nausea and vomiting   Erythromycin    REACTION: Nausea and vomiting        Medication List  Accurate as of September 16, 2022  9:47 AM. If you have any questions, ask your nurse or doctor.          acetaminophen 500 MG tablet Commonly known as: TYLENOL Take 1,000 mg by mouth as needed.   albuterol 108 (90 Base) MCG/ACT inhaler Commonly known as: VENTOLIN HFA INHALE 2 PUFFS BY MOUTH EVERY 6 HOURS AS NEEDED FOR WHEEZING AND FOR SHORTNESS OF BREATH   Calcium Carbonate-Vitamin D 600-400 MG-UNIT tablet Take 1 tablet by mouth daily.   cetirizine 10 MG tablet Commonly known as: ZYRTEC Take 10 mg by mouth daily.   Dextromethorphan-guaiFENesin 20-400 MG Tabs Take by mouth 4 (four) times daily as needed.   DOCOSAHEXAENOIC ACID PO Take by mouth.   ezetimibe 10 MG tablet Commonly known  as: Zetia Take 1 tablet (10 mg total) by mouth daily.   Glucosamine-Chondroitin-MSM 500-400-300 MG Tabs Take 1 tablet by mouth 2 (two) times daily.   latanoprost 0.005 % ophthalmic solution Commonly known as: XALATAN Place 1 drop into the left eye at bedtime.   melatonin 5 MG Tabs Take 20-40 mg by mouth at bedtime.   montelukast 10 MG tablet Commonly known as: SINGULAIR TAKE 1 TABLET BY MOUTH AT BEDTIME   multivitamin with minerals tablet Take 1 tablet by mouth daily.   NEXIUM PO Take 20 mg by mouth daily.   OVER THE COUNTER MEDICATION CBD- sublingual for sleep every night and topically as needed for pain   Sennosides-Docusate Sodium 8.6-50 MG Caps Take 3 capsules by mouth daily.   Trelegy Ellipta 200-62.5-25 MCG/ACT Aepb Generic drug: Fluticasone-Umeclidin-Vilant Inhale 1 puff into the lungs daily in the afternoon.        Signature:  Chesley Mires, MD [place] Pager - [phone] 09/16/2022, 9:47 AM

## 2022-09-20 ENCOUNTER — Other Ambulatory Visit: Payer: Self-pay | Admitting: Family Medicine

## 2022-10-07 ENCOUNTER — Other Ambulatory Visit: Payer: Self-pay | Admitting: Family Medicine

## 2022-10-14 ENCOUNTER — Telehealth: Payer: Self-pay | Admitting: Family Medicine

## 2022-10-14 DIAGNOSIS — E7800 Pure hypercholesterolemia, unspecified: Secondary | ICD-10-CM

## 2022-10-14 DIAGNOSIS — E559 Vitamin D deficiency, unspecified: Secondary | ICD-10-CM

## 2022-10-14 NOTE — Telephone Encounter (Signed)
-----   Message from Ronalee Red, RT sent at 10/07/2022 12:11 PM EST ----- Regarding: Fri 12/15 lab Patient is scheduled for cpx, please order future labs.  Thanks, Jae Dire

## 2022-10-18 ENCOUNTER — Ambulatory Visit
Admission: EM | Admit: 2022-10-18 | Discharge: 2022-10-18 | Disposition: A | Discharge status: Home / Self Care | Payer: Medicare Other | Attending: Urgent Care | Admitting: Urgent Care

## 2022-10-18 DIAGNOSIS — H109 Unspecified conjunctivitis: Secondary | ICD-10-CM | POA: Diagnosis not present

## 2022-10-18 DIAGNOSIS — B9689 Other specified bacterial agents as the cause of diseases classified elsewhere: Secondary | ICD-10-CM | POA: Diagnosis not present

## 2022-10-18 MED ORDER — POLYMYXIN B-TRIMETHOPRIM 10000-0.1 UNIT/ML-% OP SOLN: 1.0000 [drp] | Freq: Four times a day (QID) | OPHTHALMIC | 0 refills | Status: AC

## 2022-10-18 NOTE — Discharge Instructions (Signed)
Follow up here or with your primary care or ophthalmology provider if your symptoms are worsening or not improving with treatment.

## 2022-10-18 NOTE — ED Triage Notes (Signed)
Pt. Presents to UC w/ bilateral eye redness and drainage since yesterday.

## 2022-10-18 NOTE — ED Provider Notes (Signed)
Renaldo Fiddler    CSN: [id number] Arrival date & time: 10/18/22  1049      History   Chief Complaint Chief Complaint  Patient presents with   Eye Drainage    HPI MALAYSIA CRANCE is a 81 y.o. female.   HPI  Presents to urgent care with complaint of bilateral eye redness and drainage starting yesterday.  Patient states she noticed it last night at dinner.  States she put a cotton ball over her eye last night and taped it there while she slept.  States that cottonball was green and soaked with purulent discharge.  Past Medical History:  Diagnosis Date   Allergy    Anxiety    associated with dyphagia   Arthritis    osteo of left hip   Asthma    moderate persistant   Complication of anesthesia    extended anesthesia effect because of post polio syndrome   Deviated septum    2002   Hypertension    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   Osteophyte of cervical spine    pushing on esoghagus causing dysphagia   Post-polio syndrome    Scarlet fever    Sleep apnea    severe - CPAP   Wears dentures    lower partial    Patient Active Problem List   Diagnosis Date Noted   Statins contraindicated 10/31/2020   Irritation of oral cavity 07/28/2020   Hx of acute pancreatitis 10/14/2017   Right foot drop 10/14/2017   Counseling regarding end of life decision making 10/03/2014   DNR (do not resuscitate) 10/03/2014   HYPERCHOLESTEROLEMIA 03/29/2008   VAGINITIS, ATROPHIC 12/03/2007   Osteopenia 12/03/2007   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   Essential hypertension, benign 10/02/2007   EXTERNAL HEMORRHOIDS 10/02/2007   ALLERGIC RHINITIS 10/02/2007   Asthma, moderate persistent 10/02/2007   HIATAL HERNIA 10/02/2007   OSTEOARTHRITIS 10/02/2007   Obstructive sleep apnea syndrome 10/02/2007    Past Surgical History:  Procedure Laterality Date   APPENDECTOMY     CATARACT EXTRACTION W/PHACO Left 02/07/2016   Procedure: CATARACT EXTRACTION PHACO AND  INTRAOCULAR LENS PLACEMENT (IOC);  Surgeon: Lockie Mola, MD;  Location: [facility];  Service: Ophthalmology;  Laterality: Left;  CPAP   CATARACT EXTRACTION W/PHACO Right 03/06/2016   Procedure: CATARACT EXTRACTION PHACO AND INTRAOCULAR LENS PLACEMENT (IOC);  Surgeon: Lockie Mola, MD;  Location: [facility];  Service: Ophthalmology;  Laterality: Right;  CPAP   CHOLECYSTECTOMY     COLONOSCOPY WITH ESOPHAGOGASTRODUODENOSCOPY (EGD)     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      Home Medications    Prior to Admission medications   Medication Sig Start Date End Date Taking? Authorizing Provider  acetaminophen (TYLENOL) 500 MG tablet Take 1,000 mg by mouth as needed.    [provider]  albuterol (VENTOLIN HFA) 108 (90 Base) MCG/ACT inhaler INHALE 2 PUFFS BY MOUTH EVERY 6 HOURS AS NEEDED FOR WHEEZING AND FOR SHORTNESS OF BREATH 10/07/22   Bedsole, Amy E, MD  Calcium Carbonate-Vitamin D 600-400 MG-UNIT tablet Take 1 tablet by mouth daily.    [provider]  cetirizine (ZYRTEC) 10 MG tablet Take 10 mg by mouth daily.    [provider]  Dextromethorphan-Guaifenesin 20-400 MG TABS Take by mouth 4 (four) times daily as needed.    [provider]  DOCOSAHEXAENOIC ACID PO Take by mouth.    [provider]  Esomeprazole Magnesium (NEXIUM PO) Take 20 mg by mouth daily.     [provider]  ezetimibe (ZETIA) 10 MG tablet Take 1 tablet (10 mg total) by mouth daily. 11/13/21   Bedsole, Amy E, MD  Fluticasone-Umeclidin-Vilant (TRELEGY ELLIPTA) 200-62.5-25 MCG/ACT AEPB Inhale 1 puff into the lungs daily in the afternoon. 07/04/22   Coralyn Helling, MD  Glucosamine-Chondroitin-MSM 500-400-300 MG TABS Take 1 tablet by mouth 2 (two) times daily.    [provider]  latanoprost (XALATAN) 0.005 % ophthalmic solution Place 1 drop into the left eye at bedtime.   09/22/13   [provider]  Melatonin 5 MG TABS Take 20-40 mg by mouth at bedtime.     [provider]  montelukast (SINGULAIR) 10 MG tablet TAKE 1 TABLET BY MOUTH AT BEDTIME 09/20/22   Bedsole, Amy E, MD  Multiple Vitamins-Minerals (MULTIVITAMIN WITH MINERALS) tablet Take 1 tablet by mouth daily.    [provider]  OVER THE COUNTER MEDICATION CBD- sublingual for sleep every night and topically as needed for pain    [provider]  Sennosides-Docusate Sodium 8.6-50 MG CAPS Take 3 capsules by mouth daily.     [provider]    Family History Family History  Problem Relation Age of Onset   Cancer Mother        lung   Alcohol abuse Mother    Diabetes Father    Alcohol abuse Father    Cancer Maternal Grandmother        colon   Cancer Maternal Grandfather        colon   Diabetes Paternal Grandmother     Social History Social History   Tobacco Use   Smoking status: Former    Packs/day: 0.50    Years: 8.00    Total pack years: 4.00    Types: Cigarettes    Quit date: 1980    Years since quitting: 43.9   Smokeless tobacco: Never   Tobacco comments:    quit 1981  Vaping Use   Vaping Use: Never used  Substance Use Topics   Alcohol use: Yes    Comment: very rare wine (2x/yr)   Drug use: No     Allergies   Statins, Beta adrenergic blockers, Codeine, and Erythromycin   Review of Systems Review of Systems   Physical Exam Triage Vital Signs ED Triage Vitals  Enc Vitals Group     BP 10/18/22 1153 (!) 175/71     Pulse Rate 10/18/22 1153 76     Resp 10/18/22 1153 18     Temp 10/18/22 1153 98.1 F (36.7 C)     Temp Source 10/18/22 1153 Oral     SpO2 10/18/22 1153 98 %     Weight --      Height --      Head Circumference --      Peak Flow --      Pain Score 10/18/22 1146 0     Pain Loc --      Pain Edu? --      Excl. in GC? --    No data found.  Updated Vital Signs BP (!) 175/71 (BP Location: Left Arm)   Pulse 76    Temp 98.1 F (36.7 C) (Oral)   Resp 18   SpO2 98%   Visual Acuity Right Eye Distance:   Left Eye Distance:   Bilateral Distance:  Right Eye Near:   Left Eye Near:    Bilateral Near:     Physical Exam Vitals reviewed.  Constitutional:      Appearance: Normal appearance.  Eyes:     General:        Right eye: Discharge present.     Conjunctiva/sclera:     Right eye: Right conjunctiva is injected. Exudate present.     Comments: Watery mixed with yellow discharge from the right eye.  Some periorbital edema is present as well.  Skin:    General: Skin is warm and dry.  Neurological:     General: No focal deficit present.     Mental Status: She is alert and oriented to person, place, and time.  Psychiatric:        Mood and Affect: Mood normal.        Behavior: Behavior normal.      UC Treatments / Results  Labs (all labs ordered are listed, but only abnormal results are displayed) Labs Reviewed - No data to display  EKG   Radiology No results found.  Procedures Procedures (including critical care time)  Medications Ordered in UC Medications - No data to display  Initial Impression / Assessment and Plan / UC Course  I have reviewed the triage vital signs and the nursing notes.  Pertinent labs & imaging results that were available during my care of the patient were reviewed by me and considered in my medical decision making (see chart for details).   Purulent discharge is present in the right eye.  Will treat for acute bacterial conjunctivitis.   Final Clinical Impressions(s) / UC Diagnoses   Final diagnoses:  None   Discharge Instructions   None    ED Prescriptions   None    PDMP not reviewed this encounter.   Charma Igo, [place] 10/18/22 1214

## 2022-10-22 ENCOUNTER — Telehealth: Payer: Self-pay | Admitting: Family Medicine

## 2022-10-22 NOTE — Telephone Encounter (Signed)
Noted  

## 2022-10-22 NOTE — Telephone Encounter (Signed)
Patient declined awv.  

## 2022-10-25 ENCOUNTER — Other Ambulatory Visit (INDEPENDENT_AMBULATORY_CARE_PROVIDER_SITE_OTHER): Payer: Medicare Other

## 2022-10-25 DIAGNOSIS — E7800 Pure hypercholesterolemia, unspecified: Secondary | ICD-10-CM

## 2022-10-25 DIAGNOSIS — E559 Vitamin D deficiency, unspecified: Secondary | ICD-10-CM

## 2022-10-25 LAB — COMPREHENSIVE METABOLIC PANEL
ALT: 13 U/L (ref 0–35)
AST: 17 U/L (ref 0–37)
Albumin: 4.5 g/dL (ref 3.5–5.2)
Alkaline Phosphatase: 47 U/L (ref 39–117)
BUN: 9 mg/dL (ref 6–23)
CO2: 28 mEq/L (ref 19–32)
Calcium: 10 mg/dL (ref 8.4–10.5)
Chloride: 103 mEq/L (ref 96–112)
Creatinine, Ser: 0.67 mg/dL (ref 0.40–1.20)
GFR: 82.53 mL/min (ref >60.00)
Glucose, Bld: 84 mg/dL (ref 70–99)
Potassium: 4 mEq/L (ref 3.5–5.1)
Sodium: 138 mEq/L (ref 135–145)
Total Bilirubin: 0.6 mg/dL (ref 0.2–1.2)
Total Protein: 7.2 g/dL (ref 6.0–8.3)

## 2022-10-25 LAB — LIPID PANEL
Cholesterol: 178 mg/dL (ref 0–200)
HDL: 74.5 mg/dL (ref >39.00)
LDL Cholesterol: 66 mg/dL (ref 0–99)
NonHDL: 103.54
Total CHOL/HDL Ratio: 2
Triglycerides: 186 mg/dL — ABNORMAL HIGH (ref 0.0–149.0)
VLDL: 37.2 mg/dL (ref 0.0–40.0)

## 2022-10-25 LAB — VITAMIN D 25 HYDROXY (VIT D DEFICIENCY, FRACTURES): VITD: 73.93 ng/mL (ref 30.00–100.00)

## 2022-10-25 NOTE — Progress Notes (Signed)
No critical labs need to be addressed urgently. We will discuss labs in detail at upcoming office visit.   

## 2022-10-30 ENCOUNTER — Other Ambulatory Visit: Payer: Self-pay | Admitting: Family Medicine

## 2022-11-01 ENCOUNTER — Ambulatory Visit (INDEPENDENT_AMBULATORY_CARE_PROVIDER_SITE_OTHER): Payer: Medicare Other | Admitting: Family Medicine

## 2022-11-01 ENCOUNTER — Encounter: Payer: Self-pay | Admitting: Family Medicine

## 2022-11-01 VITALS — BP 144/62 | HR 95 | Temp 98.9°F | Ht 60.25 in | Wt 117.4 lb

## 2022-11-01 DIAGNOSIS — J4540 Moderate persistent asthma, uncomplicated: Secondary | ICD-10-CM

## 2022-11-01 DIAGNOSIS — G4733 Obstructive sleep apnea (adult) (pediatric): Secondary | ICD-10-CM | POA: Diagnosis not present

## 2022-11-01 DIAGNOSIS — M8589 Other specified disorders of bone density and structure, multiple sites: Secondary | ICD-10-CM | POA: Diagnosis not present

## 2022-11-01 DIAGNOSIS — Z0000 Encounter for general adult medical examination without abnormal findings: Secondary | ICD-10-CM

## 2022-11-01 DIAGNOSIS — I10 Essential (primary) hypertension: Secondary | ICD-10-CM

## 2022-11-01 DIAGNOSIS — E7800 Pure hypercholesterolemia, unspecified: Secondary | ICD-10-CM | POA: Diagnosis not present

## 2022-11-01 DIAGNOSIS — M8580 Other specified disorders of bone density and structure, unspecified site: Secondary | ICD-10-CM

## 2022-11-01 DIAGNOSIS — B91 Sequelae of poliomyelitis: Secondary | ICD-10-CM

## 2022-11-01 MED ORDER — HYDROCHLOROTHIAZIDE 25 MG PO TABS: 25.0000 mg | ORAL_TABLET | Freq: Every day | ORAL | 3 refills | Status: DC

## 2022-11-01 NOTE — Assessment & Plan Note (Signed)
Chronic, well controlled . Good control with LDL at 66 on Zetia.   Side effects to statins in the past: Cause pancreatitis

## 2022-11-01 NOTE — Assessment & Plan Note (Signed)
Chronic, right leg weakness.  Uses brace.

## 2022-11-01 NOTE — Progress Notes (Signed)
Patient ID: Courtney Chandler, female    DOB: July 08, 1941, 81 y.o.   MRN: [id number]  This visit was conducted in person.  BP (!) 144/62   Pulse 95   Temp 98.9 F (37.2 C) (Oral)   Ht 5' 0.25" (1.53 m)   Wt 117 lb 6 oz (53.2 kg)   SpO2 97%   BMI 22.73 kg/m    CC:  Chief Complaint  Patient presents with   Medicare Wellness    Subjective:   HPI: Courtney Chandler is a 81 y.o. female presenting on 11/01/2022 for Medicare Wellness  The patient presents for annual medicare wellness, complete physical and review of chronic health problems. He/She also has the following acute concerns today:  I have personally reviewed the Medicare Annual Wellness questionnaire and have noted 1. The patient's medical and social history 2. Their use of alcohol, tobacco or illicit drugs 3. Their current medications and supplements 4. The patient's functional ability including ADL's, fall risks, home safety risks and hearing or visual             impairment. 5. Diet and physical activities 6. Evidence for depression or mood disorders 7.         Updated provider list Cognitive evaluation was performed and recorded on pt medicare questionnaire form. The patients weight, height, BMI and visual acuity have been recorded in the chart   I have made referrals, counseling and provided education to the patient based review of the above and I have provided the pt with a written personalized care plan for preventive services.   Documentation of this information was scanned into the electronic record under the media tab.  No falls in last 12 months.  Flowsheet Row Office Visit from 11/01/2022 in [facility] Total Score 0       Advance directives and end of life planning reviewed in detail with patient and documented in EMR. Patient given handout on advance care directives if needed. HCPOA and living will updated if needed.  OSA on CPAP.Marland Kitchen per pt told by CPAP monitor that  it is  severe. Followed by Dr. Craige Cotta.  Recommends avoiding sedating meds.  She has been using CBD oil   Hypertension:   White coat HTN but also BP running somewhat high 142-160 at home.  HAs tolerated HCTZ in past. BP Readings from Last 3 Encounters:  11/01/22 (!) 144/62  10/18/22 (!) 175/71  09/16/22 (!) 140/78  Using medication without problems or lightheadedness: none Chest pain with exertion:none Edema: none Short of breath: stable Average home BPs: 136-150/60-70 Other issues:  Asthma severe persistent: Stable control on trelegy 200 one puff daily and singulair 10 mg qhs   and albuterol as needed. Followed by Dr. Craige Cotta.  Reviewed most recent OV 09/16/2022    Elevated Cholesterol: Good control with LDL at 66 on Zetia.   Side effects to statins in the past: Cause pancreatitis Lab Results  Component Value Date   CHOL 178 10/25/2022   HDL 74.50 10/25/2022   LDLCALC 66 10/25/2022   LDLDIRECT 99.0 10/21/2019   TRIG 186.0 (H) 10/25/2022   CHOLHDL 2 10/25/2022  Using medications without problems: Muscle aches:  Diet compliance: heart healthy diet Exercise: walking Other complaints:  Postpolio syndrome, stable  Relevant past medical, surgical, family and social history reviewed and updated as indicated. Interim medical history since our last visit reviewed. Allergies and medications reviewed and updated. Outpatient Medications Prior to Visit  Medication Sig  Dispense Refill   acetaminophen (TYLENOL) 500 MG tablet Take 1,000 mg by mouth as needed.     albuterol (VENTOLIN HFA) 108 (90 Base) MCG/ACT inhaler INHALE 2 PUFFS BY MOUTH EVERY 6 HOURS AS NEEDED FOR WHEEZING AND FOR SHORTNESS OF BREATH 9 g 2   Calcium Carbonate-Vitamin D 600-400 MG-UNIT tablet Take 1 tablet by mouth daily.     cetirizine (ZYRTEC) 10 MG tablet Take 10 mg by mouth daily.     Dextromethorphan-Guaifenesin 20-400 MG TABS Take by mouth 4 (four) times daily as needed.     DOCOSAHEXAENOIC ACID PO Take by mouth.      Esomeprazole Magnesium (NEXIUM PO) Take 20 mg by mouth daily.      ezetimibe (ZETIA) 10 MG tablet Take 1 tablet by mouth once daily 30 tablet 0   Fluticasone-Umeclidin-Vilant (TRELEGY ELLIPTA) 200-62.5-25 MCG/ACT AEPB Inhale 1 puff into the lungs daily in the afternoon. 28 each 5   Glucosamine-Chondroitin-MSM 500-400-300 MG TABS Take 1 tablet by mouth 2 (two) times daily.     latanoprost (XALATAN) 0.005 % ophthalmic solution Place 1 drop into the left eye at bedtime.      Melatonin 5 MG TABS Take 20-40 mg by mouth at bedtime.      montelukast (SINGULAIR) 10 MG tablet TAKE 1 TABLET BY MOUTH AT BEDTIME 90 tablet 0   Multiple Vitamins-Minerals (MULTIVITAMIN WITH MINERALS) tablet Take 1 tablet by mouth daily.     OVER THE COUNTER MEDICATION CBD- sublingual for sleep every night and topically as needed for pain     Sennosides-Docusate Sodium 8.6-50 MG CAPS Take 3 capsules by mouth daily.      No facility-administered medications prior to visit.     Per HPI unless specifically indicated in ROS section below Review of Systems  Constitutional:  Negative for fatigue and fever.  HENT:  Negative for congestion.   Eyes:  Negative for pain.  Respiratory:  Negative for cough and shortness of breath.   Cardiovascular:  Negative for chest pain, palpitations and leg swelling.  Gastrointestinal:  Negative for abdominal pain.  Genitourinary:  Negative for dysuria and vaginal bleeding.  Musculoskeletal:  Negative for back pain.  Neurological:  Negative for syncope, light-headedness and headaches.  Psychiatric/Behavioral:  Negative for dysphoric mood.    Objective:  BP (!) 144/62   Pulse 95   Temp 98.9 F (37.2 C) (Oral)   Ht 5' 0.25" (1.53 m)   Wt 117 lb 6 oz (53.2 kg)   SpO2 97%   BMI 22.73 kg/m   Wt Readings from Last 3 Encounters:  11/01/22 117 lb 6 oz (53.2 kg)  09/16/22 119 lb (54 kg)  07/04/22 116 lb 12.8 oz (53 kg)      Physical Exam Vitals and nursing note reviewed.  Constitutional:       General: She is not in acute distress.    Appearance: Normal appearance. She is well-developed. She is not ill-appearing or toxic-appearing.  HENT:     Head: Normocephalic.     Right Ear: Hearing, tympanic membrane, ear canal and external ear normal.     Left Ear: Hearing, tympanic membrane, ear canal and external ear normal.     Nose: Nose normal.  Eyes:     General: Lids are normal. Lids are everted, no foreign bodies appreciated.     Conjunctiva/sclera: Conjunctivae normal.     Pupils: Pupils are equal, round, and reactive to light.  Neck:     Thyroid: No thyroid mass or thyromegaly.  Vascular: No carotid bruit.     Trachea: Trachea normal.  Cardiovascular:     Rate and Rhythm: Normal rate and regular rhythm.     Heart sounds: Normal heart sounds, S1 normal and S2 normal. No murmur heard.    No gallop.  Pulmonary:     Effort: Pulmonary effort is normal. No respiratory distress.     Breath sounds: Normal breath sounds. No wheezing, rhonchi or rales.  Abdominal:     General: Bowel sounds are normal. There is no distension or abdominal bruit.     Palpations: Abdomen is soft. There is no fluid wave or mass.     Tenderness: There is no abdominal tenderness. There is no guarding or rebound.     Hernia: No hernia is present.  Musculoskeletal:     Cervical back: Normal range of motion and neck supple.  Feet:     Comments: Right foot drop wears brace Lymphadenopathy:     Cervical: No cervical adenopathy.  Skin:    General: Skin is warm and dry.     Findings: No rash.  Neurological:     Mental Status: She is alert.     Cranial Nerves: No cranial nerve deficit.     Sensory: No sensory deficit.  Psychiatric:        Mood and Affect: Mood is not anxious or depressed.        Speech: Speech normal.        Behavior: Behavior normal. Behavior is cooperative.        Judgment: Judgment normal.       Results for orders placed or performed in visit on 10/25/22  VITAMIN D 25  Hydroxy (Vit-D Deficiency, Fractures)  Result Value Ref Range   VITD 73.93 30.00 - 100.00 ng/mL  Comprehensive metabolic panel  Result Value Ref Range   Sodium 138 135 - 145 mEq/L   Potassium 4.0 3.5 - 5.1 mEq/L   Chloride 103 96 - 112 mEq/L   CO2 28 19 - 32 mEq/L   Glucose, Bld 84 70 - 99 mg/dL   BUN 9 6 - 23 mg/dL   Creatinine, Ser 5.05 0.40 - 1.20 mg/dL   Total Bilirubin 0.6 0.2 - 1.2 mg/dL   Alkaline Phosphatase 47 39 - 117 U/L   AST 17 0 - 37 U/L   ALT 13 0 - 35 U/L   Total Protein 7.2 6.0 - 8.3 g/dL   Albumin 4.5 3.5 - 5.2 g/dL   GFR 39.76 >73.41 mL/min   Calcium 10.0 8.4 - 10.5 mg/dL  Lipid panel  Result Value Ref Range   Cholesterol 178 0 - 200 mg/dL   Triglycerides 937.9 (H) 0.0 - 149.0 mg/dL   HDL 02.40 >97.35 mg/dL   VLDL 32.9 0.0 - 92.4 mg/dL   LDL Cholesterol 66 0 - 99 mg/dL   Total CHOL/HDL Ratio 2    NonHDL 103.54      COVID 19 screen:  No recent travel or known exposure to COVID19 The patient denies respiratory symptoms of COVID 19 at this time. The importance of social distancing was discussed today.   Assessment and Plan The patient's preventative maintenance and recommended screening tests for an annual wellness exam were reviewed in full today. Brought up to date unless services declined.  Counselled on the importance of diet, exercise, and its role in overall health and mortality. The patient's FH and SH was reviewed, including their home life, tobacco status, and drug and alcohol status.   Vaccines:Uptodate PNA,  COVID x 5,given  Flu in 10/22.. considering shingrix but has had zostavax. Pap/DVE:  Not indicated Mammo:11/ 2021 nml repeat q2 years DUE- Bone Density:2018 osteopenia. Vit D  Recheck in 5  years. DUE Colon: Colonoscopy: 02/2015 hemorrhoids are cause of positive IFOB, repeat colonoscopy  not indicated. Smoking Status: none ETOH/ drug use: none She has advance directives.  hep C: due   Problem List Items Addressed This Visit      Asthma, moderate persistent    Chronic, stable control on Trelegy 200 1 puff daily and Singulair 10 mg p.o. nightly Followed by pulmonary      Essential hypertension, benign     Goal BP < 150/90.  Now elevated at home and here.  Will start HCTZ 25 mg daily.. follow up in 4 week.   No clear secondary cause with labs.        Relevant Medications   hydrochlorothiazide (HYDRODIURIL) 25 MG tablet   HYPERCHOLESTEROLEMIA    Chronic, well controlled . Good control with LDL at 66 on Zetia.   Side effects to statins in the past: Cause pancreatitis      Relevant Medications   hydrochlorothiazide (HYDRODIURIL) 25 MG tablet   Obstructive sleep apnea syndrome    Severe, on CPAP followed by pulmonary.      Osteopenia   Relevant Orders   DG Bone Density   POST-POLIO SYNDROME    Chronic, right leg weakness.  Uses brace.      Other Visit Diagnoses     Medicare annual wellness visit, subsequent    -  Primary   Other specified disorders of bone density and structure, multiple sites       Relevant Orders   DG Bone Density        Kerby NoraAmy Idriss Quackenbush, MD

## 2022-11-01 NOTE — Assessment & Plan Note (Signed)
Severe, on CPAP followed by pulmonary.

## 2022-11-01 NOTE — Assessment & Plan Note (Signed)
Chronic, stable control on Trelegy 200 1 puff daily and Singulair 10 mg p.o. nightly Followed by pulmonary

## 2022-11-01 NOTE — Assessment & Plan Note (Signed)
Goal BP < 150/90.  Now elevated at home and here.  Will start HCTZ 25 mg daily.. follow up in 4 week.   No clear secondary cause with labs.

## 2022-11-01 NOTE — Patient Instructions (Addendum)
Start HCTZ 25 mg daily.  Follow  BP at home.Marland Kitchen goal < 150/90.  Please call the location of your choice from the menu below to schedule your Mammogram and/or Bone Density appointment.    [facility]   [facility]                      Phone:  [phone] [address]                               [place], [place] [address]                                                             Services: Traditional and 3D Mammogram, Bone Density   [facility]                 Phone: [phone] [address]                                                       [place], [place] [address]    Service: Bone Density ONLY   *this site does NOT perform mammograms  [facility]                        Phone:  [phone] [address]                                  [place], [place] [address]                                            Services:  3D Mammogram and Bone Density    [facility]   Phone:  [phone]   [address]                                                                            [place], [place] [address]                                            Services: 3D Mammogram and Bone [facility])  Phone:  [phone]   [address]                        [place], [place] [address]  Services:  3D Mammogram and Bone Density

## 2022-11-25 ENCOUNTER — Other Ambulatory Visit: Payer: Self-pay | Admitting: Family Medicine

## 2022-11-29 ENCOUNTER — Encounter: Payer: Self-pay | Admitting: Family Medicine

## 2022-11-29 ENCOUNTER — Ambulatory Visit (INDEPENDENT_AMBULATORY_CARE_PROVIDER_SITE_OTHER): Payer: Medicare Other | Admitting: Family Medicine

## 2022-11-29 VITALS — BP 130/60 | HR 83 | Temp 98.2°F | Ht 60.25 in | Wt 116.1 lb

## 2022-11-29 DIAGNOSIS — I10 Essential (primary) hypertension: Secondary | ICD-10-CM | POA: Diagnosis not present

## 2022-11-29 NOTE — Assessment & Plan Note (Signed)
Chronic, improved control with initiation of HCTZ 25 mg p.o. daily.  No side effects.  She will continue this medication and follow blood pressure.  Goal is less than 150/90.  Reviewed heart healthy diet with patient.

## 2022-11-29 NOTE — Progress Notes (Signed)
Patient ID: Courtney Chandler, female    DOB: 05/11/1941, 82 y.o.   MRN: [id number]  This visit was conducted in person.  BP 130/60   Pulse 83   Temp 98.2 F (36.8 C) (Oral)   Ht 5' 0.25" (1.53 m)   Wt 116 lb 2 oz (52.7 kg)   SpO2 95%   BMI 22.49 kg/m    CC:  Chief Complaint  Patient presents with   Hypertension    Subjective:   HPI: Courtney Chandler is a 82 y.o. female presenting on 11/29/2022 for Hypertension  Hypertension:   New elevation at last OV and home... started on HCTZ daily  BP Readings from Last 3 Encounters:  11/29/22 130/60  11/01/22 (!) 144/62  10/18/22 (!) 175/71   Using medication without problems or lightheadedness:  none Chest pain with exertion:none Edema: none Short of breath: stable Average home BPs: Other issues:  Reviewed last office visit November 01, 2022 as well as recent's labs.     Relevant past medical, surgical, family and social history reviewed and updated as indicated. Interim medical history since our last visit reviewed. Allergies and medications reviewed and updated. Outpatient Medications Prior to Visit  Medication Sig Dispense Refill   acetaminophen (TYLENOL) 500 MG tablet Take 1,000 mg by mouth as needed.     albuterol (VENTOLIN HFA) 108 (90 Base) MCG/ACT inhaler INHALE 2 PUFFS BY MOUTH EVERY 6 HOURS AS NEEDED FOR WHEEZING AND FOR SHORTNESS OF BREATH 9 g 2   Calcium Carbonate-Vitamin D 600-400 MG-UNIT tablet Take 1 tablet by mouth daily.     cetirizine (ZYRTEC) 10 MG tablet Take 10 mg by mouth daily.     Dextromethorphan-Guaifenesin 20-400 MG TABS Take by mouth 4 (four) times daily as needed.     DOCOSAHEXAENOIC ACID PO Take by mouth.     Esomeprazole Magnesium (NEXIUM PO) Take 20 mg by mouth daily.      ezetimibe (ZETIA) 10 MG tablet Take 1 tablet by mouth once daily 90 tablet 3   Fluticasone-Umeclidin-Vilant (TRELEGY ELLIPTA) 200-62.5-25 MCG/ACT AEPB Inhale 1 puff into the lungs daily in the afternoon. 28 each 5    Glucosamine-Chondroitin-MSM 500-400-300 MG TABS Take 1 tablet by mouth 2 (two) times daily.     hydrochlorothiazide (HYDRODIURIL) 25 MG tablet Take 1 tablet (25 mg total) by mouth daily. 90 tablet 3   latanoprost (XALATAN) 0.005 % ophthalmic solution Place 1 drop into the left eye at bedtime.      Melatonin 5 MG TABS Take 20-40 mg by mouth at bedtime.      montelukast (SINGULAIR) 10 MG tablet TAKE 1 TABLET BY MOUTH AT BEDTIME 90 tablet 0   Multiple Vitamins-Minerals (MULTIVITAMIN WITH MINERALS) tablet Take 1 tablet by mouth daily.     OVER THE COUNTER MEDICATION CBD- sublingual for sleep every night and topically as needed for pain     Sennosides-Docusate Sodium 8.6-50 MG CAPS Take 3 capsules by mouth daily.      No facility-administered medications prior to visit.     Per HPI unless specifically indicated in ROS section below Review of Systems  Constitutional:  Negative for fatigue and fever.  HENT:  Negative for congestion.   Eyes:  Negative for pain.  Respiratory:  Negative for cough and shortness of breath.   Cardiovascular:  Negative for chest pain, palpitations and leg swelling.  Gastrointestinal:  Negative for abdominal pain.  Genitourinary:  Negative for dysuria and vaginal bleeding.  Musculoskeletal:  Negative for back  pain.  Neurological:  Negative for syncope, light-headedness and headaches.  Psychiatric/Behavioral:  Negative for dysphoric mood.    Objective:  BP 130/60   Pulse 83   Temp 98.2 F (36.8 C) (Oral)   Ht 5' 0.25" (1.53 m)   Wt 116 lb 2 oz (52.7 kg)   SpO2 95%   BMI 22.49 kg/m   Wt Readings from Last 3 Encounters:  11/29/22 116 lb 2 oz (52.7 kg)  11/01/22 117 lb 6 oz (53.2 kg)  09/16/22 119 lb (54 kg)      Physical Exam Constitutional:      General: She is not in acute distress.    Appearance: Normal appearance. She is well-developed. She is not ill-appearing or toxic-appearing.  HENT:     Head: Normocephalic.     Right Ear: Hearing, tympanic  membrane, ear canal and external ear normal. Tympanic membrane is not erythematous, retracted or bulging.     Left Ear: Hearing, tympanic membrane, ear canal and external ear normal. Tympanic membrane is not erythematous, retracted or bulging.     Nose: No mucosal edema or rhinorrhea.     Right Sinus: No maxillary sinus tenderness or frontal sinus tenderness.     Left Sinus: No maxillary sinus tenderness or frontal sinus tenderness.     Mouth/Throat:     Pharynx: Uvula midline.  Eyes:     General: Lids are normal. Lids are everted, no foreign bodies appreciated.     Conjunctiva/sclera: Conjunctivae normal.     Pupils: Pupils are equal, round, and reactive to light.  Neck:     Thyroid: No thyroid mass or thyromegaly.     Vascular: No carotid bruit.     Trachea: Trachea normal.  Cardiovascular:     Rate and Rhythm: Normal rate and regular rhythm.     Pulses: Normal pulses.     Heart sounds: Normal heart sounds, S1 normal and S2 normal. No murmur heard.    No friction rub. No gallop.  Pulmonary:     Effort: Pulmonary effort is normal. No tachypnea or respiratory distress.     Breath sounds: Normal breath sounds. No decreased breath sounds, wheezing, rhonchi or rales.  Abdominal:     General: Bowel sounds are normal.     Palpations: Abdomen is soft.     Tenderness: There is no abdominal tenderness.  Musculoskeletal:     Cervical back: Normal range of motion and neck supple.  Skin:    General: Skin is warm and dry.     Findings: No rash.  Neurological:     Mental Status: She is alert.  Psychiatric:        Mood and Affect: Mood is not anxious or depressed.        Speech: Speech normal.        Behavior: Behavior normal. Behavior is cooperative.        Thought Content: Thought content normal.        Judgment: Judgment normal.       Results for orders placed or performed in visit on 10/25/22  VITAMIN D 25 Hydroxy (Vit-D Deficiency, Fractures)  Result Value Ref Range   VITD 73.93  30.00 - 100.00 ng/mL  Comprehensive metabolic panel  Result Value Ref Range   Sodium 138 135 - 145 mEq/L   Potassium 4.0 3.5 - 5.1 mEq/L   Chloride 103 96 - 112 mEq/L   CO2 28 19 - 32 mEq/L   Glucose, Bld 84 70 - 99 mg/dL  BUN 9 6 - 23 mg/dL   Creatinine, Ser 6.29 0.40 - 1.20 mg/dL   Total Bilirubin 0.6 0.2 - 1.2 mg/dL   Alkaline Phosphatase 47 39 - 117 U/L   AST 17 0 - 37 U/L   ALT 13 0 - 35 U/L   Total Protein 7.2 6.0 - 8.3 g/dL   Albumin 4.5 3.5 - 5.2 g/dL   GFR 47.65 >46.50 mL/min   Calcium 10.0 8.4 - 10.5 mg/dL  Lipid panel  Result Value Ref Range   Cholesterol 178 0 - 200 mg/dL   Triglycerides 354.6 (H) 0.0 - 149.0 mg/dL   HDL 56.81 >27.51 mg/dL   VLDL 70.0 0.0 - 17.4 mg/dL   LDL Cholesterol 66 0 - 99 mg/dL   Total CHOL/HDL Ratio 2    NonHDL 103.54     Assessment and Plan  Essential hypertension, benign Assessment & Plan: Chronic, improved control with initiation of HCTZ 25 mg p.o. daily.  No side effects.  She will continue this medication and follow blood pressure.  Goal is less than 150/90.  Reviewed heart healthy diet with patient.     Return if symptoms worsen or fail to improve.   Kerby Nora, MD

## 2022-12-14 ENCOUNTER — Other Ambulatory Visit: Payer: Self-pay | Admitting: Family Medicine

## 2022-12-30 ENCOUNTER — Other Ambulatory Visit: Payer: Self-pay | Admitting: Pulmonary Disease

## 2023-02-02 ENCOUNTER — Other Ambulatory Visit: Payer: Self-pay | Admitting: Pulmonary Disease

## 2023-02-03 DIAGNOSIS — H401121 Primary open-angle glaucoma, left eye, mild stage: Secondary | ICD-10-CM | POA: Diagnosis not present

## 2023-02-10 DIAGNOSIS — H40001 Preglaucoma, unspecified, right eye: Secondary | ICD-10-CM | POA: Diagnosis not present

## 2023-02-10 DIAGNOSIS — H401121 Primary open-angle glaucoma, left eye, mild stage: Secondary | ICD-10-CM | POA: Diagnosis not present

## 2023-02-10 DIAGNOSIS — Z961 Presence of intraocular lens: Secondary | ICD-10-CM | POA: Diagnosis not present

## 2023-02-13 ENCOUNTER — Other Ambulatory Visit: Payer: Self-pay | Admitting: Family Medicine

## 2023-03-02 ENCOUNTER — Other Ambulatory Visit: Payer: Self-pay | Admitting: Pulmonary Disease

## 2023-03-04 ENCOUNTER — Telehealth: Payer: Self-pay | Admitting: Family Medicine

## 2023-03-04 DIAGNOSIS — Z1231 Encounter for screening mammogram for malignant neoplasm of breast: Secondary | ICD-10-CM

## 2023-03-04 NOTE — Telephone Encounter (Signed)
Let patient know order for mammogram screening sent to [facility] healthcare.

## 2023-03-04 NOTE — Telephone Encounter (Addendum)
I faxed order over to [facility] at [phone]  I notified patient via telephone that orders have been sent over and she can call to schedule her mammogram at her convenience.

## 2023-03-04 NOTE — Telephone Encounter (Signed)
Patient called in requesting orders for her mammography be sent in to [facility] mammography, states she has been going there for a while and would like to continue going there for her mammograms. Stated she wants to schedule an appointment with them around 5/15. Please advise, thank you.

## 2023-03-26 ENCOUNTER — Encounter: Payer: Self-pay | Admitting: Pulmonary Disease

## 2023-03-26 ENCOUNTER — Ambulatory Visit (INDEPENDENT_AMBULATORY_CARE_PROVIDER_SITE_OTHER): Payer: Medicare Other | Admitting: Pulmonary Disease

## 2023-03-26 VITALS — BP 120/68 | HR 90 | Temp 97.7°F | Ht 60.25 in | Wt 114.2 lb

## 2023-03-26 DIAGNOSIS — J4550 Severe persistent asthma, uncomplicated: Secondary | ICD-10-CM | POA: Diagnosis not present

## 2023-03-26 DIAGNOSIS — R064 Hyperventilation: Secondary | ICD-10-CM

## 2023-03-26 DIAGNOSIS — G14 Postpolio syndrome: Secondary | ICD-10-CM

## 2023-03-26 DIAGNOSIS — R0609 Other forms of dyspnea: Secondary | ICD-10-CM

## 2023-03-26 DIAGNOSIS — G4733 Obstructive sleep apnea (adult) (pediatric): Secondary | ICD-10-CM | POA: Diagnosis not present

## 2023-03-26 NOTE — Progress Notes (Signed)
[place] [facility], [facility] Sleep [facility]  Chief Complaint  Patient presents with   Follow-up    CPAP is fine. Mask and pressure are good.  SOB off and on. Dry cough. No wheezing    Past Surgical History:  She  has a past surgical history that includes Foot Fusion (1955); Nasal septum surgery; Appendectomy; Cholecystectomy; Colonoscopy with esophagogastroduodenoscopy (egd); Cataract extraction w/PHACO (Left, 02/07/2016); and Cataract extraction w/PHACO (Right, 03/06/2016).  Past Medical History:  HTN, Osteopenia, Anxiety, Osteoarthritis, Deviated nasal septum, Post polio syndrome, Scarlet fever  Constitutional:  BP 120/68 (BP Location: Left Arm, Cuff Size: Normal)   Pulse 90   Temp 97.7 F (36.5 C)   Ht 5' 0.25" (1.53 m)   Wt 114 lb 3.2 oz (51.8 kg)   SpO2 99%   BMI 22.12 kg/m   Brief Summary:  Courtney Chandler is a 82 y.o. female former smoker with obstructive sleep apnea and asthma.  She is a retired Engineer, civil (consulting).      Subjective:   She is a type A personality.  She gets rushed at times and then feels anxious.  Her breathing gets fast and then she starts to get dizzy and her hands tingle.  Once she slows her breathing down she starts to feel better.  She read about post-polio and was concerned she could have chronic alveolar hypoventilation.  She uses CPAP nightly.  No issues with mask fit or pressure setting.  This helps her sleep.  Not having cough, wheeze, or sputum.  Physical Exam:   Appearance - well kempt   ENMT - no sinus tenderness, no oral exudate, no LAN, Mallampati 2 airway, no stridor  Respiratory - equal breath sounds bilaterally, no wheezing or rales  CV - s1s2 regular rate and rhythm, no murmurs  Ext - no clubbing, no edema  Skin - no rashes  Psych - normal mood and affect    Pulmonary testing:  IgE 03/14/22 >> 15 PFT 06/20/22 >> FEV1 2.26 (143%), FEV1 74, TLC 4.91 (110%), DLCO 65%  Chest Imaging:    Sleep Tests:  PSG 08/07/05 >> RDI  67.9, SpO2 low 61% Auto CPAP 02/23/23 to 03/24/23 >> used on 30 of 30 nights with average 5 hrs 12 min.  Average AHI 1.6 with CPAP 20 cm H2O  Social History:  She  reports that she quit smoking about 44 years ago. Her smoking use included cigarettes. She has a 4.00 pack-year smoking history. She has never used smokeless tobacco. She reports current alcohol use. She reports that she does not use drugs.  Family History:  Her family history includes Alcohol abuse in her father and mother; Cancer in her maternal grandfather, maternal grandmother, and mother; Diabetes in her father and paternal grandmother.     Assessment/Plan:   Dyspnea on exertion with severe, persistent asthma and history of polio. - continue trelegy 200 one puff daily and singulair 10 mg qhs - prn albuterol  Obstructive sleep apnea. - she is compliant with CPAP and reports benefit from therapy - she uses [employer] for her DME - current CPAP ordered 11/15/20 - continue CPAP 20 cm H2O  Hyperventilation. - likely from stress and anxiety - discussed techniques to help slow her breathing down when she has symptoms - don't think her symptoms fit with chronic alveolar hypoventilation - advised she could try using CPAP during the day as needed to assist her breathing  Time Spent Involved in Patient Care on Day of Examination:  38 minutes  Follow up:   Patient Instructions  Follow up in 6 months  Medication List:   Allergies as of 03/26/2023       Reactions   Statins     pancreatitis   Beta Adrenergic Blockers    Asthma exacerbation   Codeine    REACTION: Nausea and vomiting   Erythromycin    REACTION: Nausea and vomiting        Medication List        Accurate as of Mar 26, 2023  4:32 PM. If you have any questions, ask your nurse or doctor.          acetaminophen 500 MG tablet Commonly known as: TYLENOL Take 1,000 mg by mouth as needed.   albuterol 108 (90 Base) MCG/ACT inhaler Commonly known as:  VENTOLIN HFA INHALE 2 PUFFS BY MOUTH EVERY 6 HOURS AS NEEDED FOR WHEEZING AND FOR SHORTNESS OF BREATH   Calcium Carbonate-Vitamin D 600-400 MG-UNIT tablet Take 1 tablet by mouth daily.   cetirizine 10 MG tablet Commonly known as: ZYRTEC Take 10 mg by mouth daily.   Dextromethorphan-guaiFENesin 20-400 MG Tabs Take by mouth 4 (four) times daily as needed.   DOCOSAHEXAENOIC ACID PO Take by mouth.   ezetimibe 10 MG tablet Commonly known as: ZETIA Take 1 tablet by mouth once daily   Glucosamine-Chondroitin-MSM 500-400-300 MG Tabs Take 1 tablet by mouth 2 (two) times daily.   hydrochlorothiazide 25 MG tablet Commonly known as: HYDRODIURIL Take 1 tablet (25 mg total) by mouth daily.   latanoprost 0.005 % ophthalmic solution Commonly known as: XALATAN Place 1 drop into the left eye at bedtime.   melatonin 5 MG Tabs Take 20-40 mg by mouth at bedtime.   montelukast 10 MG tablet Commonly known as: SINGULAIR TAKE 1 TABLET BY MOUTH AT BEDTIME   multivitamin with minerals tablet Take 1 tablet by mouth daily.   NEXIUM PO Take 20 mg by mouth daily.   OVER THE COUNTER MEDICATION CBD- sublingual for sleep every night and topically as needed for pain   Sennosides-Docusate Sodium 8.6-50 MG Caps Take 3 capsules by mouth daily.   Trelegy Ellipta 200-62.5-25 MCG/ACT Aepb Generic drug: Fluticasone-Umeclidin-Vilant INHALE 1 PUFF ONCE DAILY IN  THE  AFTERNOON        Signature:  Coralyn Helling, MD [facility]monary/Critical Care Pager - [phone] 03/26/2023, 4:32 PM

## 2023-03-26 NOTE — Patient Instructions (Signed)
Follow up in 6 months 

## 2023-03-30 ENCOUNTER — Other Ambulatory Visit: Payer: Self-pay | Admitting: Pulmonary Disease

## 2023-04-03 DIAGNOSIS — Z1231 Encounter for screening mammogram for malignant neoplasm of breast: Secondary | ICD-10-CM | POA: Diagnosis not present

## 2023-04-03 LAB — HM MAMMOGRAPHY

## 2023-04-04 ENCOUNTER — Encounter: Payer: Self-pay | Admitting: Family Medicine

## 2023-04-18 ENCOUNTER — Other Ambulatory Visit: Payer: Self-pay | Admitting: Family Medicine

## 2023-05-16 ENCOUNTER — Other Ambulatory Visit: Payer: Self-pay | Admitting: Family Medicine

## 2023-07-29 ENCOUNTER — Other Ambulatory Visit: Payer: Self-pay | Admitting: Pulmonary Disease

## 2023-07-29 DIAGNOSIS — Z23 Encounter for immunization: Secondary | ICD-10-CM | POA: Diagnosis not present

## 2023-08-12 DIAGNOSIS — H43813 Vitreous degeneration, bilateral: Secondary | ICD-10-CM | POA: Diagnosis not present

## 2023-08-12 DIAGNOSIS — Z961 Presence of intraocular lens: Secondary | ICD-10-CM | POA: Diagnosis not present

## 2023-08-12 DIAGNOSIS — H401121 Primary open-angle glaucoma, left eye, mild stage: Secondary | ICD-10-CM | POA: Diagnosis not present

## 2023-08-12 DIAGNOSIS — H04123 Dry eye syndrome of bilateral lacrimal glands: Secondary | ICD-10-CM | POA: Diagnosis not present

## 2023-09-24 ENCOUNTER — Encounter: Payer: Self-pay | Admitting: Primary Care

## 2023-09-24 ENCOUNTER — Ambulatory Visit (INDEPENDENT_AMBULATORY_CARE_PROVIDER_SITE_OTHER): Payer: Medicare Other | Admitting: Primary Care

## 2023-09-24 VITALS — BP 132/60 | HR 88 | Temp 98.1°F | Ht 60.25 in | Wt 111.0 lb

## 2023-09-24 DIAGNOSIS — J4550 Severe persistent asthma, uncomplicated: Secondary | ICD-10-CM | POA: Diagnosis not present

## 2023-09-24 DIAGNOSIS — G4733 Obstructive sleep apnea (adult) (pediatric): Secondary | ICD-10-CM

## 2023-09-24 DIAGNOSIS — R053 Chronic cough: Secondary | ICD-10-CM

## 2023-09-24 NOTE — Progress Notes (Signed)
@Patient  ID: Courtney Chandler, female    DOB: [birth year], 82 y.o.   MRN: [id number]  Chief Complaint  Patient presents with   Follow-up    Wearing CPAP nightly. No problems with mask or pressure.     Referring provider: Excell Seltzer, MD  HPI: 82 year old female, former smoker.  Past medical history significant for hypertension, asthma, OSA, allergic rhinitis, postpolio syndrome, osteoarthritis, osteopenia, hypercholesterolemia.  Former patient of Dr. Craige Cotta.  09/24/2023 Discussed the use of AI scribe software for clinical note transcription with the patient, who gave verbal consent to proceed.  History of Present Illness   Patient presents today for annual follow-up OSA on CPAP. She has been on CPAP therapy for over 15 years. The most recent CPAP study in 2006 showed a respiratory index of 67 per hour and oxygen levels as low as 61 percent. Current CPAP data indicates good compliance with 100% usage and a significant reduction in apnea events.  The patient has also reported episodes of breathlessness and dizziness, which she attributes to anxiety and PTSD. She has noticed these symptoms tend to worsen during conversations and in the summer heat. The patient has a history of asthma and has been managing it with Trelegy, Singulair, and Albuterol as needed. She has noticed an improvement in her asthma symptoms since starting Trelegy. She has expressed a willingness to undergo further testing to investigate the cause of her bronchitic changes on chest imaging due to persistent dyspnea.  The patient has noticed a correlation between her breathing difficulties and her PTSD symptoms, particularly when talking to people. She has also reported episodes of hyperventilation, which she manages by slowing her breathing. The patient has a fear of dying in her sleep. However, she has been reassured that her risk is minimal as long as she continues her CPAP therapy.     In addition to sleep apnea and asthma,  the patient has a history of polio and has been experiencing some difficulty swallowing. She has been managing this with Nexium.  Airview download 08/24/2023 - 09/22/2023 Usage days 30/30 days (100%) greater than 4 hours Average usage 5 hours 9 minutes Pressure 20 cm H2O Air leaks 0.8 L/min (median); 29.2 L/min (95%) AHI 2.1   Allergies  Allergen Reactions   Statins      pancreatitis   Beta Adrenergic Blockers     Asthma exacerbation   Codeine     REACTION: Nausea and vomiting   Erythromycin     REACTION: Nausea and vomiting    Immunization History  Administered Date(s) Administered   Fluad Quad(high Dose 65+) 08/05/2019, 07/28/2020   Influenza Split 09/27/2011, 08/04/2012   Influenza Whole 10/02/2007, 08/16/2008, 07/30/2010   Influenza, High Dose Seasonal PF 08/26/2013, 07/13/2020, 08/27/2021, 08/31/2022, 07/19/2023   Influenza,inj,Quad PF,6+ Mos 10/03/2014, 10/09/2015, 10/10/2016, 09/26/2017, 09/24/2018   Moderna Covid-19 Fall Seasonal Vaccine 12yrs & older 08/31/2022   PFIZER(Purple Top)SARS-COV-2 Vaccination 12/02/2019, 12/23/2019, 08/18/2020, 03/22/2021   Pfizer Covid-19 Vaccine Bivalent Booster 64yrs & up 08/27/2021   Pneumococcal Conjugate-13 10/03/2014   Pneumococcal Polysaccharide-23 03/31/2008, 06/22/2017   Td 10/02/2007, 10/10/2017   Zoster, Live 08/14/2010    Past Medical History:  Diagnosis Date   Allergy    Anxiety    associated with dyphagia   Arthritis    osteo of left hip   Asthma    moderate persistant   Complication of anesthesia    extended anesthesia effect because of post polio syndrome   Deviated septum    2002  Hypertension    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   Osteophyte of cervical spine    pushing on esoghagus causing dysphagia   Post-polio syndrome    Scarlet fever    Sleep apnea    severe - CPAP   Wears dentures    lower partial    Tobacco History: Social History   Tobacco Use  Smoking Status Former    Current packs/day: 0.00   Average packs/day: 0.5 packs/day for 8.0 years (4.0 ttl pk-yrs)   Types: Cigarettes   Start date: 52   Quit date: 24   Years since quitting: 44.8  Smokeless Tobacco Never  Tobacco Comments   quit 1981   Counseling given: Not Answered Tobacco comments: quit 1981   Outpatient Medications Prior to Visit  Medication Sig Dispense Refill   acetaminophen (TYLENOL) 500 MG tablet Take 1,000 mg by mouth as needed.     albuterol (VENTOLIN HFA) 108 (90 Base) MCG/ACT inhaler INHALE 2 PUFFS BY MOUTH EVERY 6 HOURS AS NEEDED FOR WHEEZING AND FOR SHORTNESS OF BREATH 9 g 2   Calcium Carbonate-Vitamin D 600-400 MG-UNIT tablet Take 1 tablet by mouth daily.     cetirizine (ZYRTEC) 10 MG tablet Take 10 mg by mouth daily as needed for allergies.     Dextromethorphan-Guaifenesin 20-400 MG TABS Take by mouth 4 (four) times daily as needed.     DOCOSAHEXAENOIC ACID PO Take by mouth.     Esomeprazole Magnesium (NEXIUM PO) Take 20 mg by mouth daily.      ezetimibe (ZETIA) 10 MG tablet Take 1 tablet by mouth once daily 90 tablet 3   Fluticasone-Umeclidin-Vilant (TRELEGY ELLIPTA) 200-62.5-25 MCG/ACT AEPB INHALE 1 PUFF ONCE DAILY IN  THE  AFTERNOON 60 each 3   Glucosamine-Chondroitin-MSM 500-400-300 MG TABS Take 1 tablet by mouth 2 (two) times daily.     hydrochlorothiazide (HYDRODIURIL) 25 MG tablet Take 1 tablet (25 mg total) by mouth daily. 90 tablet 3   latanoprost (XALATAN) 0.005 % ophthalmic solution Place 1 drop into the left eye at bedtime.      Melatonin 5 MG TABS Take 20-40 mg by mouth at bedtime.      montelukast (SINGULAIR) 10 MG tablet TAKE 1 TABLET BY MOUTH AT BEDTIME 90 tablet 3   Multiple Vitamins-Minerals (MULTIVITAMIN WITH MINERALS) tablet Take 1 tablet by mouth daily.     OVER THE COUNTER MEDICATION CBD- sublingual for sleep every night and topically as needed for pain     Sennosides-Docusate Sodium 8.6-50 MG CAPS Take 3 capsules by mouth daily.      No  facility-administered medications prior to visit.    Review of Systems  Review of Systems  Constitutional: Negative.   HENT:  Positive for trouble swallowing.   Respiratory:  Positive for cough and shortness of breath.   Cardiovascular: Negative.    Physical Exam  BP 132/60 (BP Location: Left Arm, Patient Position: Sitting, Cuff Size: Normal)   Pulse 88   Temp 98.1 F (36.7 C) (Temporal)   Ht 5' 0.25" (1.53 m)   Wt 111 lb (50.3 kg)   SpO2 98%   BMI 21.50 kg/m  Physical Exam Constitutional:      Appearance: Normal appearance.  HENT:     Mouth/Throat:     Comments: Voice hoarseness after talking for period of time Cardiovascular:     Rate and Rhythm: Normal rate and regular rhythm.  Pulmonary:     Effort: Pulmonary effort is normal.  Breath sounds: No wheezing, rhonchi or rales.     Comments: CTA; shallow breathing  Musculoskeletal:        General: Normal range of motion.  Skin:    General: Skin is warm and dry.  Neurological:     General: No focal deficit present.     Mental Status: She is alert and oriented to person, place, and time. Mental status is at baseline.  Psychiatric:        Behavior: Behavior normal.        Thought Content: Thought content normal.        Judgment: Judgment normal.     Comments: Anxious at times       Lab Results:  CBC    Component Value Date/Time   WBC 6.6 03/14/2022 1153   RBC 4.34 03/14/2022 1153   HGB 14.1 03/14/2022 1153   HCT 42.4 03/14/2022 1153   PLT 284 03/14/2022 1153   MCV 97.7 03/14/2022 1153   MCH 32.5 03/14/2022 1153   MCHC 33.3 03/14/2022 1153   RDW 13.0 03/14/2022 1153   LYMPHSABS 1.7 03/14/2022 1153   MONOABS 0.6 03/14/2022 1153   EOSABS 0.1 03/14/2022 1153   BASOSABS 0.1 03/14/2022 1153    BMET    Component Value Date/Time   NA 138 10/25/2022 0748   K 4.0 10/25/2022 0748   CL 103 10/25/2022 0748   CO2 28 10/25/2022 0748   GLUCOSE 84 10/25/2022 0748   BUN 9 10/25/2022 0748   CREATININE 0.67  10/25/2022 0748   CALCIUM 10.0 10/25/2022 0748   GFRNONAA >60 03/14/2022 1153   GFRAA >60 06/22/2017 0356    BNP No results found for: "BNP"  ProBNP No results found for: "PROBNP"  Imaging: No results found.   Assessment & Plan:   1. Severe persistent asthma without complication - Pulmonary Function Test [facility] Only; Future  2. OSA (obstructive sleep apnea) - AMB REFERRAL FOR DME  3. Chronic cough - CT CHEST HIGH RESOLUTION; Future   Obstructive Sleep Apnea Severe sleep apnea well controlled with CPAP at a pressure of 20cm h20; Residual AHI 2.1/hour. She is 100% compliant with usage over the last 30 days > 4 hours, average 5 hours 9 mins. Continue CPAP use nightly at current settings. Renew CPAP supplies with [employer] for 1 year.   Asthma Symptoms of dyspnea and cough, particularly in the summer months. Anxiety symptoms exacerbate her shortness of breath. She reports clinical benefit from Trelegy , Singulair, and Albuterol as needed. Continue current medications. Recommend getting HRCT imaging of lungs to evaluate for potential interstitial lung disease given history of polio and abnormal chest x-ray. Repeat PFTs in 6 months.   Vocal Cord Dysfunction Symptoms of voice changes and difficulty swallowing. Currently on Nexium for reflux management. -Continue Nexium as prescribed  -Consider vocal rehab therapy for symptom control and/or referral to ENT   Follow-up in 6 months with PFTs      Glenford Bayley, NP 09/24/2023

## 2023-09-24 NOTE — Patient Instructions (Signed)
-  OBSTRUCTIVE SLEEP APNEA: Your condition is well controlled with your CPAP machine, reducing your apnea events to 2 per hour. Please continue using your CPAP every night at the current settings.  -ASTHMA: Asthma is a condition where your airways narrow and swell, causing difficulty in breathing. You are currently managing it with Trelegy, Singulair, and Albuterol as needed. We will continue with these medications and consider a CT scan of your lungs to check for any interstitial lung disease, especially given your history of polio and abnormal chest x-ray.  -VOCAL CORD DYSFUNCTION: Vocal cord dysfunction is when your vocal cords do not open correctly, which can cause voice changes and difficulty swallowing. You are currently taking Nexium for reflux management. We recommend continuing with Nexium and considering vocal rehab therapy to help with your symptoms.  Orders: HRCT re: chronic cough/shortness of breath Renew CPAP supplies x 1 year with DME PFTs in 6 months   Follow-up 6 months with Courtney Chandler (PFTs prior)

## 2023-10-03 ENCOUNTER — Ambulatory Visit
Admission: RE | Admit: 2023-10-03 | Discharge: 2023-10-03 | Disposition: A | Discharge status: Home / Self Care | Payer: Medicare Other | Source: Ambulatory Visit | Attending: Family Medicine | Admitting: Family Medicine

## 2023-10-03 DIAGNOSIS — R053 Chronic cough: Secondary | ICD-10-CM | POA: Diagnosis not present

## 2023-10-03 DIAGNOSIS — J849 Interstitial pulmonary disease, unspecified: Secondary | ICD-10-CM | POA: Diagnosis not present

## 2023-10-03 DIAGNOSIS — R918 Other nonspecific abnormal finding of lung field: Secondary | ICD-10-CM | POA: Diagnosis not present

## 2023-10-03 DIAGNOSIS — J929 Pleural plaque without asbestos: Secondary | ICD-10-CM | POA: Diagnosis not present

## 2023-10-15 ENCOUNTER — Other Ambulatory Visit: Payer: Self-pay | Admitting: Family Medicine

## 2023-10-24 ENCOUNTER — Other Ambulatory Visit: Payer: Self-pay | Admitting: Family Medicine

## 2023-10-28 ENCOUNTER — Other Ambulatory Visit: Payer: Medicare Other

## 2023-10-30 ENCOUNTER — Encounter: Payer: Self-pay | Admitting: *Deleted

## 2023-10-30 ENCOUNTER — Ambulatory Visit (INDEPENDENT_AMBULATORY_CARE_PROVIDER_SITE_OTHER): Payer: Medicare Other | Admitting: Family Medicine

## 2023-10-30 ENCOUNTER — Encounter: Payer: Self-pay | Admitting: Family Medicine

## 2023-10-30 ENCOUNTER — Telehealth: Payer: Self-pay | Admitting: Family Medicine

## 2023-10-30 VITALS — BP 138/60 | HR 88 | Temp 98.6°F | Ht 60.25 in | Wt 113.1 lb

## 2023-10-30 DIAGNOSIS — I700 Atherosclerosis of aorta: Secondary | ICD-10-CM | POA: Diagnosis not present

## 2023-10-30 DIAGNOSIS — I10 Essential (primary) hypertension: Secondary | ICD-10-CM | POA: Diagnosis not present

## 2023-10-30 DIAGNOSIS — J4540 Moderate persistent asthma, uncomplicated: Secondary | ICD-10-CM | POA: Diagnosis not present

## 2023-10-30 DIAGNOSIS — I2510 Atherosclerotic heart disease of native coronary artery without angina pectoris: Secondary | ICD-10-CM | POA: Diagnosis not present

## 2023-10-30 DIAGNOSIS — I2583 Coronary atherosclerosis due to lipid rich plaque: Secondary | ICD-10-CM | POA: Diagnosis not present

## 2023-10-30 DIAGNOSIS — E7800 Pure hypercholesterolemia, unspecified: Secondary | ICD-10-CM

## 2023-10-30 DIAGNOSIS — R0602 Shortness of breath: Secondary | ICD-10-CM | POA: Diagnosis not present

## 2023-10-30 DIAGNOSIS — Z5309 Procedure and treatment not carried out because of other contraindication: Secondary | ICD-10-CM

## 2023-10-30 DIAGNOSIS — E2839 Other primary ovarian failure: Secondary | ICD-10-CM | POA: Diagnosis not present

## 2023-10-30 LAB — LIPID PANEL
Cholesterol: 198 mg/dL (ref 0–200)
HDL: 80.5 mg/dL (ref >39.00)
LDL Cholesterol: 58 mg/dL (ref 0–99)
NonHDL: 117.65
Total CHOL/HDL Ratio: 2
Triglycerides: 298 mg/dL — ABNORMAL HIGH (ref 0.0–149.0)
VLDL: 59.6 mg/dL — ABNORMAL HIGH (ref 0.0–40.0)

## 2023-10-30 LAB — COMPREHENSIVE METABOLIC PANEL
ALT: 13 U/L (ref 0–35)
AST: 20 U/L (ref 0–37)
Albumin: 4.5 g/dL (ref 3.5–5.2)
Alkaline Phosphatase: 42 U/L (ref 39–117)
BUN: 12 mg/dL (ref 6–23)
CO2: 27 meq/L (ref 19–32)
Calcium: 10 mg/dL (ref 8.4–10.5)
Chloride: 101 meq/L (ref 96–112)
Creatinine, Ser: 0.77 mg/dL (ref 0.40–1.20)
GFR: 72.33 mL/min (ref >60.00)
Glucose, Bld: 99 mg/dL (ref 70–99)
Potassium: 3.2 meq/L — ABNORMAL LOW (ref 3.5–5.1)
Sodium: 138 meq/L (ref 135–145)
Total Bilirubin: 0.6 mg/dL (ref 0.2–1.2)
Total Protein: 7.4 g/dL (ref 6.0–8.3)

## 2023-10-30 NOTE — Progress Notes (Addendum)
Patient ID: Courtney Chandler, female    DOB: Oct 11, 1941, 82 y.o.   MRN: [id number]  This visit was conducted in person.  BP 138/60 (BP Location: Left Arm, Patient Position: Sitting, Cuff Size: Normal)   Pulse 88   Temp 98.6 F (37 C) (Temporal)   Ht 5' 0.25" (1.53 m)   Wt 113 lb 2 oz (51.3 kg)   SpO2 98%   BMI 21.91 kg/m    CC:  Chief Complaint  Patient presents with   Medical Management of Chronic Issues    Needs annual visit but doesn't want to do a CPE    Subjective:   HPI: Courtney Chandler is a 82 y.o. female presenting on 10/30/2023 for Medical Management of Chronic Issues (Needs annual visit but doesn't want to do a CPE)  OSA on CPAP.Marland Kitchen per pt told by CPAP monitor that  it is severe. Followed by Dr. Craige Cotta.  Recommends avoiding sedating meds.  She has been using CBD oil   Hypertension:   White coat HTN, improved control on hydrochlorothiazide 25 mg daily BP Readings from Last 3 Encounters:  10/30/23 138/60  09/24/23 132/60  03/26/23 120/68  Using medication without problems or lightheadedness: none Chest pain with exertion:none Edema: none Short of breath: stable Average home BPs: 120/70s now Other issues:  Asthma severe persistent: Stable control on Trelegy 200 one puff daily and singulair 10 mg qhs   and albuterol as needed. Followed by Dr. Craige Cotta.  CT chest pending for chronic cough.. possible dx of ILD.   OSA:  followed by Pulm  Reviewed last OV 11/23/2022   Contiued to have dyspnea spells.. 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  Neg stress test 17 years ago  Using  lidocaine patches for low back /hip pain.  Using less tylneol.    Elevated Cholesterol: Good control with LDL at 66 on Zetia.   Side effects to statins in the past: Cause pancreatitis Lab Results  Component Value Date   CHOL 178 10/25/2022   HDL 74.50 10/25/2022   LDLCALC 66 10/25/2022   LDLDIRECT 99.0 10/21/2019   TRIG 186.0 (H) 10/25/2022   CHOLHDL 2  10/25/2022  Using medications without problems: Muscle aches:  Diet compliance: heart healthy diet Exercise: walking Other complaints:  Postpolio syndrome, stable  Relevant past medical, surgical, family and social history reviewed and updated as indicated. Interim medical history since our last visit reviewed. Allergies and medications reviewed and updated. Outpatient Medications Prior to Visit  Medication Sig Dispense Refill   acetaminophen (TYLENOL) 500 MG tablet Take 1,000 mg by mouth as needed.     albuterol (VENTOLIN HFA) 108 (90 Base) MCG/ACT inhaler INHALE 2 PUFFS BY MOUTH EVERY 6 HOURS AS NEEDED FOR WHEEZING AND FOR SHORTNESS OF BREATH 9 g 0   Calcium Carbonate-Vitamin D 600-400 MG-UNIT tablet Take 1 tablet by mouth daily.     cetirizine (ZYRTEC) 10 MG tablet Take 10 mg by mouth daily as needed for allergies.     Dextromethorphan-Guaifenesin 20-400 MG TABS Take by mouth 4 (four) times daily as needed.     DOCOSAHEXAENOIC ACID PO Take by mouth.     Esomeprazole Magnesium (NEXIUM PO) Take 20 mg by mouth daily.      ezetimibe (ZETIA) 10 MG tablet Take 1 tablet by mouth once daily 90 tablet 3   Fluticasone-Umeclidin-Vilant (TRELEGY ELLIPTA) 200-62.5-25 MCG/ACT AEPB INHALE 1 PUFF ONCE DAILY IN  THE  AFTERNOON 60 each 3   Glucosamine-Chondroitin-MSM  500-400-300 MG TABS Take 1 tablet by mouth 2 (two) times daily.     hydrochlorothiazide (HYDRODIURIL) 25 MG tablet Take 1 tablet by mouth once daily 90 tablet 0   latanoprost (XALATAN) 0.005 % ophthalmic solution Place 1 drop into the left eye at bedtime.      Lidocaine HCl (LIDAFLEX) 4 % PTCH Apply 1 patch topically in the morning.     Melatonin 5 MG TABS Take 20-40 mg by mouth at bedtime.      montelukast (SINGULAIR) 10 MG tablet TAKE 1 TABLET BY MOUTH AT BEDTIME 90 tablet 3   Multiple Vitamins-Minerals (MULTIVITAMIN WITH MINERALS) tablet Take 1 tablet by mouth daily.     OVER THE COUNTER MEDICATION CBD- sublingual for sleep every night  and topically as needed for pain     Sennosides-Docusate Sodium 8.6-50 MG CAPS Take 3 capsules by mouth daily.      No facility-administered medications prior to visit.     Per HPI unless specifically indicated in ROS section below Review of Systems  Constitutional:  Negative for fatigue and fever.  HENT:  Negative for congestion.   Eyes:  Negative for pain.  Respiratory:  Negative for cough and shortness of breath.   Cardiovascular:  Negative for chest pain, palpitations and leg swelling.  Gastrointestinal:  Negative for abdominal pain.  Genitourinary:  Negative for dysuria and vaginal bleeding.  Musculoskeletal:  Negative for back pain.  Neurological:  Negative for syncope, light-headedness and headaches.  Psychiatric/Behavioral:  Negative for dysphoric mood.    Objective:  BP 138/60 (BP Location: Left Arm, Patient Position: Sitting, Cuff Size: Normal)   Pulse 88   Temp 98.6 F (37 C) (Temporal)   Ht 5' 0.25" (1.53 m)   Wt 113 lb 2 oz (51.3 kg)   SpO2 98%   BMI 21.91 kg/m   Wt Readings from Last 3 Encounters:  10/30/23 113 lb 2 oz (51.3 kg)  09/24/23 111 lb (50.3 kg)  03/26/23 114 lb 3.2 oz (51.8 kg)      Physical Exam Vitals and nursing note reviewed.  Constitutional:      General: She is not in acute distress.    Appearance: Normal appearance. She is well-developed. She is not ill-appearing or toxic-appearing.  HENT:     Head: Normocephalic.     Right Ear: Hearing, tympanic membrane, ear canal and external ear normal.     Left Ear: Hearing, tympanic membrane, ear canal and external ear normal.     Nose: Nose normal.  Eyes:     General: Lids are normal. Lids are everted, no foreign bodies appreciated.     Conjunctiva/sclera: Conjunctivae normal.     Pupils: Pupils are equal, round, and reactive to light.  Neck:     Thyroid: No thyroid mass or thyromegaly.     Vascular: No carotid bruit.     Trachea: Trachea normal.  Cardiovascular:     Rate and Rhythm: Normal  rate and regular rhythm.     Heart sounds: Normal heart sounds, S1 normal and S2 normal. No murmur heard.    No gallop.  Pulmonary:     Effort: Pulmonary effort is normal. No respiratory distress.     Breath sounds: Normal breath sounds. No wheezing, rhonchi or rales.  Abdominal:     General: Bowel sounds are normal. There is no distension or abdominal bruit.     Palpations: Abdomen is soft. There is no fluid wave or mass.     Tenderness: There is no  abdominal tenderness. There is no guarding or rebound.     Hernia: No hernia is present.  Musculoskeletal:     Cervical back: Normal range of motion and neck supple.  Feet:     Comments: Right foot drop wears brace Lymphadenopathy:     Cervical: No cervical adenopathy.  Skin:    General: Skin is warm and dry.     Findings: No rash.  Neurological:     Mental Status: She is alert.     Cranial Nerves: No cranial nerve deficit.     Sensory: No sensory deficit.  Psychiatric:        Mood and Affect: Mood is not anxious or depressed.        Speech: Speech normal.        Behavior: Behavior normal. Behavior is cooperative.        Judgment: Judgment normal.       Results for orders placed or performed in visit on 04/04/23  HM MAMMOGRAPHY   Collection Time: 04/03/23 12:44 PM  Result Value Ref Range   HM Mammogram 0-4 Bi-Rad 0-4 Bi-Rad, Self Reported Normal     COVID 19 screen:  No recent travel or known exposure to COVID19 The patient denies respiratory symptoms of COVID 19 at this time. The importance of social distancing was discussed today.   Assessment and Plan The patient's preventative maintenance and recommended screening tests for an annual wellness exam were reviewed in full today. Brought up to date unless services declined.  Counselled on the importance of diet, exercise, and its role in overall health and mortality. The patient's FH and SH was reviewed, including their home life, tobacco status, and drug and alcohol  status.   Vaccines:Uptodate PNA, COVID x 5,given  Flu in 10/22.. considering shingrix but has had zostavax. Pap/DVE:  Not indicated Mammo:03/2023 nml repeat q2 years Bone Density:2018 osteopenia. Vit D  Recheck in 5  years. DUE Colon: Colonoscopy: 02/2015 hemorrhoids are cause of positive IFOB, repeat colonoscopy  not indicated. Smoking Status: none ETOH/ drug use: none She has advance directives.  hep C: due   Problem List Items Addressed This Visit     Aortic atherosclerosis (HCC)   Noted on Chest CT 2024.  Statin contraindicated.  LDL goal <70.      Relevant Orders   CT CARDIAC SCORING (SELF PAY ONLY)   Asthma, moderate persistent   Chronic, stable control on Trelegy 200 1 puff daily and Singulair 10 mg p.o. nightly Followed by pulmonary      Essential hypertension, benign - Primary   Chronic, improved control with initiation of HCTZ 25 mg p.o. daily.  No side effects.       HYPERCHOLESTEROLEMIA   Due for re-eval. LDL  goal < 70       Relevant Orders   Lipid panel   Comprehensive metabolic panel   Shortness of breath   Chronic, most likely pulmonary in source but given CT chest showing evidence of multivessel coronary artery disease, we will quantify this more with calcium CT score.  If elevated will move forward with referral to cardiology for repeat stress testing.  Offered cardiology referral at this point and patient is not yet interested.      Relevant Orders   CT CARDIAC SCORING (SELF PAY ONLY)   Statins contraindicated   Statin caused eye pancreatitis.      Other Visit Diagnoses       Coronary artery disease due to lipid rich plaque  Estrogen deficiency       Relevant Orders   DG Bone Density         Kerby Nora, MD

## 2023-10-30 NOTE — Assessment & Plan Note (Addendum)
Due for re-eval. ?LDL goal < 70. ?

## 2023-10-30 NOTE — Patient Instructions (Signed)
Please stop at the lab to have labs drawn.  

## 2023-10-30 NOTE — Assessment & Plan Note (Addendum)
Statin caused eye pancreatitis.

## 2023-10-30 NOTE — Assessment & Plan Note (Signed)
Chronic, stable control on Trelegy 200 1 puff daily and Singulair 10 mg p.o. nightly Followed by pulmonary

## 2023-10-30 NOTE — Assessment & Plan Note (Signed)
Chronic, improved control with initiation of HCTZ 25 mg p.o. daily.  No side effects.

## 2023-10-30 NOTE — Telephone Encounter (Signed)
Patient is wanting to know if donna can call for her to make a appt for the cardic testing she needs a call back

## 2023-10-30 NOTE — Assessment & Plan Note (Signed)
Chronic, most likely pulmonary in source but given CT chest showing evidence of multivessel coronary artery disease, we will quantify this more with calcium CT score.  If elevated will move forward with referral to cardiology for repeat stress testing.  Offered cardiology referral at this point and patient is not yet interested.

## 2023-10-30 NOTE — Telephone Encounter (Signed)
Do you know what this pertains to?

## 2023-10-30 NOTE — Assessment & Plan Note (Signed)
Noted on Chest CT 2024.  Statin contraindicated.  LDL goal <70.

## 2023-10-31 ENCOUNTER — Other Ambulatory Visit: Payer: Self-pay | Admitting: Family Medicine

## 2023-10-31 DIAGNOSIS — E876 Hypokalemia: Secondary | ICD-10-CM

## 2023-10-31 MED ORDER — POTASSIUM CHLORIDE CRYS ER 20 MEQ PO TBCR: 20.0000 meq | EXTENDED_RELEASE_TABLET | Freq: Every day | ORAL | 0 refills | Status: DC

## 2023-10-31 NOTE — Telephone Encounter (Signed)
Courtney Chandler notified as instructed by telephone.  She also ask about the bone density.  I advised order was faxed over yesterday so she can call them to schedule.  Patient states understanding.

## 2023-11-03 ENCOUNTER — Telehealth: Payer: Self-pay | Admitting: Family Medicine

## 2023-11-03 DIAGNOSIS — M8580 Other specified disorders of bone density and structure, unspecified site: Secondary | ICD-10-CM | POA: Diagnosis not present

## 2023-11-03 DIAGNOSIS — E2839 Other primary ovarian failure: Secondary | ICD-10-CM | POA: Diagnosis not present

## 2023-11-03 DIAGNOSIS — M85852 Other specified disorders of bone density and structure, left thigh: Secondary | ICD-10-CM | POA: Diagnosis not present

## 2023-11-03 LAB — HM DEXA SCAN

## 2023-11-03 NOTE — Telephone Encounter (Signed)
See result note from 10/30/2023.

## 2023-11-03 NOTE — Telephone Encounter (Signed)
Received call from call ctr, stated patient was reaching back out after a missed call regarding labs. Patient requests a call back regarding labs when able.

## 2023-11-04 ENCOUNTER — Encounter: Payer: Medicare Other | Admitting: Family Medicine

## 2023-11-10 ENCOUNTER — Ambulatory Visit
Admission: RE | Admit: 2023-11-10 | Discharge: 2023-11-10 | Disposition: A | Discharge status: Home / Self Care | Payer: Self-pay | Source: Ambulatory Visit | Attending: Family Medicine | Admitting: Family Medicine

## 2023-11-10 DIAGNOSIS — I700 Atherosclerosis of aorta: Secondary | ICD-10-CM | POA: Insufficient documentation

## 2023-11-10 DIAGNOSIS — R0602 Shortness of breath: Secondary | ICD-10-CM | POA: Insufficient documentation

## 2023-11-13 ENCOUNTER — Telehealth: Payer: Self-pay

## 2023-11-13 DIAGNOSIS — R931 Abnormal findings on diagnostic imaging of heart and coronary circulation: Secondary | ICD-10-CM

## 2023-11-13 DIAGNOSIS — R0602 Shortness of breath: Secondary | ICD-10-CM

## 2023-11-13 DIAGNOSIS — I2510 Atherosclerotic heart disease of native coronary artery without angina pectoris: Secondary | ICD-10-CM

## 2023-11-13 NOTE — Telephone Encounter (Signed)
 Referral to cardiology placed.  I do suggest enteric-coated aspirin low-dose at 81 mg daily despite her past history of gastritis.  If she has stomach upset she needs to stop this.

## 2023-11-13 NOTE — Telephone Encounter (Signed)
 Spoke with Ms. Gotzen.  She reviewed her CT cardiac scoring results.  She is agreeable to Cardiology referral and prefers [place].  She also saw that Dr. Avelina recommended ASA 81 mg.  She has had gastritis and a GI bleed in the past but is willing to try that aspirin if that is what is recommended.  I advised to get the coated aspirin to try.  I also let her know I would send message to Dr. Avelina for review and if she has different recommendation, I would call her back.

## 2023-11-13 NOTE — Telephone Encounter (Signed)
 Courtney Chandler notified as instructed by telephone.  Patient states understanding.

## 2023-11-13 NOTE — Telephone Encounter (Signed)
 You sent this back to [facility] but I do not see any additional notes from you.  Please advise.

## 2023-11-13 NOTE — Addendum Note (Signed)
 Addended by: Kerby Nora E on: 11/13/2023 02:55 PM   Modules accepted: Orders

## 2023-11-13 NOTE — Telephone Encounter (Signed)
 Copied from CRM [id number]. Topic: Clinical - Lab/Test Results >> Nov 13, 2023 10:59 AM Steele Sizer wrote: Reason for CRM: Pt is requesting a callback from Lupita Leash to speak about some lab results that have came in for her.

## 2023-11-14 ENCOUNTER — Other Ambulatory Visit: Payer: Self-pay | Admitting: Family Medicine

## 2023-11-14 ENCOUNTER — Other Ambulatory Visit (INDEPENDENT_AMBULATORY_CARE_PROVIDER_SITE_OTHER): Payer: Medicare Other

## 2023-11-14 DIAGNOSIS — E876 Hypokalemia: Secondary | ICD-10-CM | POA: Diagnosis not present

## 2023-11-14 LAB — POTASSIUM: Potassium: 3.1 meq/L — ABNORMAL LOW (ref 3.5–5.1)

## 2023-11-14 MED ORDER — POTASSIUM CHLORIDE CRYS ER 20 MEQ PO TBCR: 20.0000 meq | EXTENDED_RELEASE_TABLET | Freq: Every day | ORAL | 0 refills | Status: DC

## 2023-11-25 ENCOUNTER — Ambulatory Visit: Payer: Self-pay | Admitting: Family Medicine

## 2023-11-25 NOTE — Telephone Encounter (Signed)
 Copied from CRM [id number]. Topic: Clinical - Red Word Triage >> Nov 25, 2023  9:21 AM Rolin D wrote: Red Word that prompted transfer to Nurse Triage: Extreme abdominal pain    Chief Complaint:  Abdominal Pain -  On potassium  Gastiritis  problem with elelctorlyte Symptoms:  gas and bloating Frequency: x 2 days Pertinent Negatives: Patient denies fever Disposition: [] ED /[] Urgent Care (no appt availability in office) / [x] Appointment(In office/virtual)/ []  [facility]/ [] Home Care/ [] Refused Recommended Disposition /[] [place] [facility] [facility]/ []  Follow-up with PCP Additional Notes: Scheduled for 11/26/23 Reason for Disposition  [1] MILD pain (e.g., does not interfere with normal activities) AND [2] pain comes and goes (cramps) AND [3] present > 48 hours  (Exception: This same abdominal pain is a chronic symptom recurrent or ongoing AND present > 4 weeks.)  Age > 60 years  Answer Assessment - Initial Assessment Questions 1. LOCATION: Where does it hurt?        Started out more general then became   moreAbdominal Pain Lower left Quadrant  it is easing up  2. RADIATION: Does the pain shoot anywhere else? (e.g., chest, back)    Denies it stays in her abdomen like gas 3. ONSET: When did the pain begin? (e.g., minutes, hours or days ago)        A few days ago it was brief and  then mildly, over the last couple of days increase in flatulence. Yesterday she had two  bowel movements- pasty 4. SUDDEN: Gradual or sudden onset?     gradual 5. PATTERN Does the pain come and go, or is it constant? Constant     - If it comes and goes: How long does it last?  Do you have pain now? Yes   rate pain 4 or 5  Thinks the simethicone is work     (Note: Comes and goes means the pain is intermittent. It goes away completely between bouts.)    - If constant: Is it getting better, staying the same, or getting worse?      (Note: Constant means the pain never goes away completely; most  serious pain is constant and gets worse.)       Getting better since she took her simethicone  - belly was very bloated and it was gas 6. SEVERITY: How bad is the pain?  (e.g., Scale 1-10; mild, moderate, or severe)    Rate Pain as 4 on pain scale    - MODERATE (4-7): Interferes with normal activities or awakens from sleep, abdomen tender to touch.     7. RECURRENT SYMPTOM: Have you ever had this type of stomach pain before? If Yes, ask: When was the last time? and What happened that time?       No. She  was giving 5 days  prescribed in January 3rd 2025 8. CAUSE: What do you think is causing the stomach pain?      Thinks the Potassium 9. RELIEVING/AGGRAVATING FACTORS: What makes it better or worse? (e.g., antacids, bending or twisting motion, bowel movement)     Simethicone 10. OTHER SYMPTOMS: Do you have any other symptoms? (e.g., back pain, diarrhea, fever, urination pain, vomiting)   Denies.  Protocols used: Abdominal Pain - Female-A-AH

## 2023-11-25 NOTE — Telephone Encounter (Signed)
 Noted.

## 2023-11-26 ENCOUNTER — Encounter: Payer: Self-pay | Admitting: Family Medicine

## 2023-11-26 ENCOUNTER — Ambulatory Visit (INDEPENDENT_AMBULATORY_CARE_PROVIDER_SITE_OTHER): Payer: Medicare Other | Admitting: Family Medicine

## 2023-11-26 VITALS — BP 128/60 | HR 82 | Temp 98.2°F | Ht 60.25 in | Wt 110.0 lb

## 2023-11-26 DIAGNOSIS — R140 Abdominal distension (gaseous): Secondary | ICD-10-CM | POA: Diagnosis not present

## 2023-11-26 DIAGNOSIS — E876 Hypokalemia: Secondary | ICD-10-CM

## 2023-11-26 LAB — BASIC METABOLIC PANEL
BUN: 15 mg/dL (ref 6–23)
CO2: 28 meq/L (ref 19–32)
Calcium: 10.1 mg/dL (ref 8.4–10.5)
Chloride: 101 meq/L (ref 96–112)
Creatinine, Ser: 0.79 mg/dL (ref 0.40–1.20)
GFR: 70.1 mL/min (ref >60.00)
Glucose, Bld: 86 mg/dL (ref 70–99)
Potassium: 3.4 meq/L — ABNORMAL LOW (ref 3.5–5.1)
Sodium: 138 meq/L (ref 135–145)

## 2023-11-26 LAB — MAGNESIUM: Magnesium: 2 mg/dL (ref 1.5–2.5)

## 2023-11-26 NOTE — Progress Notes (Signed)
 Patient ID: Courtney Chandler, female    DOB: [birth year], 83 y.o.   MRN: [id number]  This visit was conducted in person.  BP 128/60   Pulse 82   Temp 98.2 F (36.8 C) (Oral)   Ht 5' 0.25" (1.53 m)   Wt 110 lb (49.9 kg)   SpO2 90%   BMI 21.30 kg/m    CC:  Chief Complaint  Patient presents with   Abdominal Pain    Ongoing for 2 days. Tuesday morning was the worse.     Subjective:   HPI: Courtney Chandler is a 83 y.o. female presenting on 11/26/2023 for Abdominal Pain (Ongoing for 2 days. Tuesday morning was the worse. )   Has had low potassium... persistently .Aaron Aas Now on Kdur  20 MEQ, Mg 500 mg daily. New onset abdominal pain x 2 days,  generalized, fullness pain, bloating.  Yesterday pain increase to 10/10.  Regular BMs  Simethicone helped some. Stopped. Potassium and mag... feeling much better.   No vomiting or diarrhea.  No fever.  No dysuria, change in urination.  Has chronic gastritis, occ diarrhea.. this may be why she was low in potassium  She has been having low potassium      Relevant past medical, surgical, family and social history reviewed and updated as indicated. Interim medical history since our last visit reviewed. Allergies and medications reviewed and updated. Outpatient Medications Prior to Visit  Medication Sig Dispense Refill   acetaminophen  (TYLENOL ) 500 MG tablet Take 1,000 mg by mouth as needed.     albuterol  (VENTOLIN  HFA) 108 (90 Base) MCG/ACT inhaler INHALE 2 PUFFS BY MOUTH EVERY 6 HOURS AS NEEDED FOR WHEEZING AND FOR SHORTNESS OF BREATH 9 g 0   aspirin EC 81 MG tablet Take 81 mg by mouth daily. Swallow whole.     Calcium Carbonate-Vitamin D  600-400 MG-UNIT tablet Take 1 tablet by mouth daily.     cetirizine (ZYRTEC) 10 MG tablet Take 10 mg by mouth daily as needed for allergies.     Dextromethorphan-Guaifenesin 20-400 MG TABS Take by mouth 4 (four) times daily as needed.     DOCOSAHEXAENOIC ACID PO Take by mouth.     ezetimibe  (ZETIA ) 10 MG  tablet Take 1 tablet by mouth once daily 90 tablet 3   Fluticasone -Umeclidin-Vilant (TRELEGY ELLIPTA ) 200-62.5-25 MCG/ACT AEPB INHALE 1 PUFF ONCE DAILY IN  THE  AFTERNOON 60 each 3   Glucosamine-Chondroitin-MSM 500-400-300 MG TABS Take 1 tablet by mouth 2 (two) times daily.     hydrochlorothiazide  (HYDRODIURIL ) 25 MG tablet Take 1 tablet by mouth once daily 90 tablet 0   latanoprost  (XALATAN ) 0.005 % ophthalmic solution Place 1 drop into the left eye at bedtime.      Lidocaine  HCl (LIDAFLEX) 4 % PTCH Apply 1 patch topically in the morning.     Melatonin 5 MG TABS Take 20-40 mg by mouth at bedtime.      montelukast  (SINGULAIR ) 10 MG tablet TAKE 1 TABLET BY MOUTH AT BEDTIME 90 tablet 3   Multiple Vitamins-Minerals (MULTIVITAMIN WITH MINERALS) tablet Take 1 tablet by mouth daily.     OVER THE COUNTER MEDICATION CBD- sublingual for sleep every night and topically as needed for pain     Sennosides-Docusate Sodium 8.6-50 MG CAPS Take 3 capsules by mouth daily.      Esomeprazole Magnesium  (NEXIUM PO) Take 20 mg by mouth daily.  (Patient not taking: Reported on 11/26/2023)     potassium chloride  SA (KLOR-CON  M)  20 MEQ tablet Take 1 tablet (20 mEq total) by mouth daily. (Patient not taking: Reported on 11/26/2023) 14 tablet 0   No facility-administered medications prior to visit.     Per HPI unless specifically indicated in ROS section below Review of Systems  Constitutional:  Negative for fatigue and fever.  HENT:  Negative for congestion.   Eyes:  Negative for pain.  Respiratory:  Negative for cough and shortness of breath.   Cardiovascular:  Negative for chest pain, palpitations and leg swelling.  Gastrointestinal:  Positive for abdominal distention and abdominal pain. Negative for blood in stool, constipation, diarrhea, nausea and vomiting.  Genitourinary:  Negative for dysuria and vaginal bleeding.  Musculoskeletal:  Negative for back pain.  Neurological:  Negative for syncope, light-headedness  and headaches.  Psychiatric/Behavioral:  Negative for dysphoric mood.    Objective:  BP 128/60   Pulse 82   Temp 98.2 F (36.8 C) (Oral)   Ht 5' 0.25" (1.53 m)   Wt 110 lb (49.9 kg)   SpO2 90%   BMI 21.30 kg/m   Wt Readings from Last 3 Encounters:  11/26/23 110 lb (49.9 kg)  10/30/23 113 lb 2 oz (51.3 kg)  09/24/23 111 lb (50.3 kg)      Physical Exam Constitutional:      General: She is not in acute distress.    Appearance: Normal appearance. She is well-developed. She is not ill-appearing or toxic-appearing.  HENT:     Head: Normocephalic.     Right Ear: Hearing, tympanic membrane, ear canal and external ear normal. Tympanic membrane is not erythematous, retracted or bulging.     Left Ear: Hearing, tympanic membrane, ear canal and external ear normal. Tympanic membrane is not erythematous, retracted or bulging.     Nose: No mucosal edema or rhinorrhea.     Right Sinus: No maxillary sinus tenderness or frontal sinus tenderness.     Left Sinus: No maxillary sinus tenderness or frontal sinus tenderness.     Mouth/Throat:     Pharynx: Uvula midline.  Eyes:     General: Lids are normal. Lids are everted, no foreign bodies appreciated.     Conjunctiva/sclera: Conjunctivae normal.     Pupils: Pupils are equal, round, and reactive to light.  Neck:     Thyroid: No thyroid mass or thyromegaly.     Vascular: No carotid bruit.     Trachea: Trachea normal.  Cardiovascular:     Rate and Rhythm: Normal rate and regular rhythm.     Pulses: Normal pulses.     Heart sounds: Normal heart sounds, S1 normal and S2 normal. No murmur heard.    No friction rub. No gallop.  Pulmonary:     Effort: Pulmonary effort is normal. No tachypnea or respiratory distress.     Breath sounds: Normal breath sounds. No decreased breath sounds, wheezing, rhonchi or rales.  Abdominal:     General: Bowel sounds are normal.     Palpations: Abdomen is soft.     Tenderness: There is abdominal tenderness in the  left lower quadrant. There is no right CVA tenderness or left CVA tenderness.  Musculoskeletal:     Cervical back: Normal range of motion and neck supple.  Skin:    General: Skin is warm and dry.     Findings: No rash.  Neurological:     Mental Status: She is alert.  Psychiatric:        Mood and Affect: Mood is not anxious or depressed.  Speech: Speech normal.        Behavior: Behavior normal. Behavior is cooperative.        Thought Content: Thought content normal.        Judgment: Judgment normal.       Results for orders placed or performed in visit on 11/14/23  Potassium   Collection Time: 11/14/23 10:13 AM  Result Value Ref Range   Potassium 3.1 (L) 3.5 - 5.1 mEq/L    Assessment and Plan  Abdominal bloating Assessment & Plan: Acute, most likely secondary to initiating magnesium .  She has now stopped and pain is improving. She does have a history of diverticulosis and pain is in the left lower quadrant.  If symptoms are not improving with hydration and bland diet she will call for possible further evaluation.   Hypokalemia Assessment & Plan: Acute, possibly decreased given changes in diet.  Discussed adding back green leafy vegetables, fruits as able and lean meats. Due for reevaluation of potassium and magnesium .  Orders: -     Basic metabolic panel -     Magnesium     No follow-ups on file.   Herby Lolling, MD

## 2023-11-26 NOTE — Assessment & Plan Note (Signed)
 Acute, most likely secondary to initiating magnesium .  She has now stopped and pain is improving. She does have a history of diverticulosis and pain is in the left lower quadrant.  If symptoms are not improving with hydration and bland diet she will call for possible further evaluation.

## 2023-11-26 NOTE — Assessment & Plan Note (Signed)
 Acute, possibly decreased given changes in diet.  Discussed adding back green leafy vegetables, fruits as able and lean meats. Due for reevaluation of potassium and magnesium .

## 2023-11-28 ENCOUNTER — Other Ambulatory Visit: Payer: Medicare Other

## 2023-12-01 NOTE — Progress Notes (Signed)
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 Marlan Palau first available. Send her ILD packet and please order ILD labs (the basic set). Ideally plan will be to get repeat imaging again in 1 year to assess for changed. If she has not had PFTs in the last year please also order full PFTs prior to MR visit   Incidental findings aortic atherosclerosis, plaque build up in several arteries, should follow up with pcp or cardiology/ not urgent but if develops CP should be evaluated in ED

## 2023-12-04 ENCOUNTER — Other Ambulatory Visit: Payer: Self-pay | Admitting: Family Medicine

## 2023-12-04 ENCOUNTER — Telehealth: Payer: Self-pay | Admitting: Primary Care

## 2023-12-04 NOTE — Telephone Encounter (Signed)
Patient advised as below. Appt scheduled with Dr. Marchelle Gearing.     Franco Nones, CMA 12/03/2023  2:33 PM EST      Called patient.  Left message on VM to give CT chest results, set up visit with Dr. Marchelle Gearing, send ILD packet, order ILD labs, repeat imaging again in 1 year, schedule full PFT if not had in last year, and follow up with pcp or cardiology. 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 Marlan Palau first available. Send her ILD packet and please order ILD labs (the basic set). Ideally plan will be to get repeat imaging again in 1 year to assess for changed. If she has not had PFTs in the last year please also order full PFTs prior to MR visit   Incidental findings aortic atherosclerosis, plaque build up in several arteries, should follow up with pcp or cardiology/ not urgent but if develops CP should be evaluated in ED

## 2023-12-04 NOTE — Telephone Encounter (Signed)
PT ret call from someone who was calling her on CT results. Please call her again @ [phone]

## 2023-12-07 ENCOUNTER — Other Ambulatory Visit: Payer: Self-pay | Admitting: Family Medicine

## 2023-12-08 ENCOUNTER — Other Ambulatory Visit: Payer: Self-pay | Admitting: *Deleted

## 2023-12-08 ENCOUNTER — Telehealth: Payer: Self-pay | Admitting: Pediatric Endocrinology

## 2023-12-08 ENCOUNTER — Other Ambulatory Visit: Payer: Self-pay

## 2023-12-08 DIAGNOSIS — J84112 Idiopathic pulmonary fibrosis: Secondary | ICD-10-CM

## 2023-12-08 DIAGNOSIS — J4550 Severe persistent asthma, uncomplicated: Secondary | ICD-10-CM

## 2023-12-08 DIAGNOSIS — R0609 Other forms of dyspnea: Secondary | ICD-10-CM

## 2023-12-08 DIAGNOSIS — R053 Chronic cough: Secondary | ICD-10-CM

## 2023-12-08 MED ORDER — TRELEGY ELLIPTA 200-62.5-25 MCG/ACT IN AEPB: 1.0000 | INHALATION_SPRAY | Freq: Every day | RESPIRATORY_TRACT | 6 refills | Status: DC

## 2023-12-08 NOTE — Addendum Note (Signed)
Addended byClyda Greener M on: 12/08/2023 03:59 PM   Modules accepted: Orders

## 2023-12-08 NOTE — Telephone Encounter (Signed)
PT calling because she has a FU appt w/Dr. Marchelle Gearing but she would like to go to a Dr. Juanda Bond who she says is closer to her home in [place]. Does she need a referral? Please call to advise if she does @ [phone]   Also, she needs a Trelegy refill. Pharm is [facility] in [place] on [address].

## 2023-12-08 NOTE — Addendum Note (Signed)
Addended byMichail Jewels, Ricca Melgarejo M on: 12/08/2023 04:00 PM   Modules accepted: Orders

## 2023-12-08 NOTE — Progress Notes (Signed)
Thanks Amy! Awesome job

## 2023-12-08 NOTE — Telephone Encounter (Signed)
Disregard this part of my last message:  PT calling because she has a FU appt w/Dr. Marchelle Gearing but she would like to go to a Dr. Juanda Bond who she says is closer to her home in [place]. Does she need a referral? Please call to advise if she does @ [phone]      "Dr. Belinda Block"  IS Dr. Marchelle Gearing. Please just fill Trelegy.

## 2023-12-10 ENCOUNTER — Other Ambulatory Visit (INDEPENDENT_AMBULATORY_CARE_PROVIDER_SITE_OTHER): Payer: Medicare Other

## 2023-12-10 DIAGNOSIS — J84112 Idiopathic pulmonary fibrosis: Secondary | ICD-10-CM | POA: Diagnosis not present

## 2023-12-10 DIAGNOSIS — J4550 Severe persistent asthma, uncomplicated: Secondary | ICD-10-CM

## 2023-12-10 LAB — BASIC METABOLIC PANEL
BUN: 15 mg/dL (ref 6–23)
CO2: 28 meq/L (ref 19–32)
Calcium: 10.1 mg/dL (ref 8.4–10.5)
Chloride: 99 meq/L (ref 96–112)
Creatinine, Ser: 0.85 mg/dL (ref 0.40–1.20)
GFR: 64.19 mL/min (ref >60.00)
Glucose, Bld: 89 mg/dL (ref 70–99)
Potassium: 3.3 meq/L — ABNORMAL LOW (ref 3.5–5.1)
Sodium: 137 meq/L (ref 135–145)

## 2023-12-10 LAB — HEPATIC FUNCTION PANEL
ALT: 13 U/L (ref 0–35)
AST: 22 U/L (ref 0–37)
Albumin: 4.4 g/dL (ref 3.5–5.2)
Alkaline Phosphatase: 43 U/L (ref 39–117)
Bilirubin, Direct: 0.2 mg/dL (ref 0.0–0.3)
Total Bilirubin: 0.7 mg/dL (ref 0.2–1.2)
Total Protein: 7.4 g/dL (ref 6.0–8.3)

## 2023-12-10 LAB — CBC WITH DIFFERENTIAL/PLATELET
Basophils Absolute: 0.1 10*3/uL (ref 0.0–0.1)
Basophils Relative: 1.5 % (ref 0.0–3.0)
Eosinophils Absolute: 0.1 10*3/uL (ref 0.0–0.7)
Eosinophils Relative: 2.1 % (ref 0.0–5.0)
HCT: 41.9 % (ref 36.0–46.0)
Hemoglobin: 14 g/dL (ref 12.0–15.0)
Lymphocytes Relative: 29.7 % (ref 12.0–46.0)
Lymphs Abs: 1.8 10*3/uL (ref 0.7–4.0)
MCHC: 33.5 g/dL (ref 30.0–36.0)
MCV: 100.1 fL — ABNORMAL HIGH (ref 78.0–100.0)
Monocytes Absolute: 0.5 10*3/uL (ref 0.1–1.0)
Monocytes Relative: 7.8 % (ref 3.0–12.0)
Neutro Abs: 3.6 10*3/uL (ref 1.4–7.7)
Neutrophils Relative %: 58.9 % (ref 43.0–77.0)
Platelets: 297 10*3/uL (ref 150.0–400.0)
RBC: 4.19 Mil/uL (ref 3.87–5.11)
RDW: 13.1 % (ref 11.5–15.5)
WBC: 6.1 10*3/uL (ref 4.0–10.5)

## 2023-12-10 NOTE — Telephone Encounter (Signed)
Pt states she would like to have her PFT done in [place] due to transportation issues. Pt states her Trelegy was already taken care of. Pt would like to start being seen in [place] instead of [place]. Pt states she was seeing Waynetta Sandy, NP in [place] and does not know what happened. I informed the pt that [place], Np works in [employer] and Armed forces operational officer. 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 Please advise this. Pt needs a full PFT and wants her blood work results reviewed as well.

## 2023-12-11 NOTE — Telephone Encounter (Signed)
Routing to front desk to arrange the appts as outlined by MR  The PFT at [facility] has already been ordered  Thanks!

## 2023-12-11 NOTE — Telephone Encounter (Signed)
I called PT to let her know PFT order has been put in. She does want to see Dr. Marchelle Gearing and has no issue driving to [facility] because of his expertise. I verified her upcoming appt in [facility] and assured her a [facility] would call to sched appt. 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

## 2023-12-11 NOTE — Telephone Encounter (Signed)
27 min to our office versus 12 min to [facility]. She cane see Dr Elgie Collard at [facility] but heis general pulm and NOT ILD specilais. We can also co-manage. She can do a video vsiit me as well so we can co-manage. She can PFT at [facility]. I would say she can start with a video visti wht me

## 2023-12-12 LAB — ANA+ENA+DNA/DS+ANTICH+CENTR
ANA Titer 1: NEGATIVE
Anti JO-1: <0.2 AI (ref 0.0–0.9)
Centromere Ab Screen: <0.2 AI (ref 0.0–0.9)
Chromatin Ab SerPl-aCnc: <0.2 AI (ref 0.0–0.9)
ENA RNP Ab: <0.2 AI (ref 0.0–0.9)
ENA SM Ab Ser-aCnc: <0.2 AI (ref 0.0–0.9)
ENA SSA (RO) Ab: <0.2 AI (ref 0.0–0.9)
ENA SSB (LA) Ab: <0.2 AI (ref 0.0–0.9)
Scleroderma (Scl-70) (ENA) Antibody, IgG: <0.2 AI (ref 0.0–0.9)
dsDNA Ab: 1 [IU]/mL (ref 0–9)

## 2023-12-12 LAB — SPECIMEN STATUS REPORT

## 2023-12-12 NOTE — Telephone Encounter (Signed)
Her appt is not until April with Dr. Marchelle Gearing and her PFT will be scheduled in March

## 2023-12-12 NOTE — Telephone Encounter (Signed)
Synetta Fail, please advise on PFT in [place].

## 2023-12-13 LAB — QUANTIFERON-TB GOLD PLUS
Mitogen-NIL: 9.55 [IU]/mL
NIL: 0.01 [IU]/mL
QuantiFERON-TB Gold Plus: NEGATIVE
TB1-NIL: 0.12 [IU]/mL
TB2-NIL: 0.19 [IU]/mL

## 2023-12-14 LAB — RHEUMATOID FACTOR: Rheumatoid fact SerPl-aCnc: <10 [IU]/mL (ref <14)

## 2023-12-14 LAB — ANTI-NUCLEAR AB-TITER (ANA TITER): ANA Titer 1: 1:40 {titer} — ABNORMAL HIGH

## 2023-12-14 LAB — ANA: Anti Nuclear Antibody (ANA): POSITIVE — AB

## 2023-12-14 LAB — CYCLIC CITRUL PEPTIDE ANTIBODY, IGG: Cyclic Citrullin Peptide Ab: <16 U

## 2023-12-16 NOTE — Progress Notes (Signed)
Please let patient know her ILD serology was normal, ANA was positive but a weak titer. Potasium was 3.3 (slightly low), encourage she focus on eating potassium rich foods including avocados, sweet potato's, spinach, bananas and coconut water.

## 2023-12-21 ENCOUNTER — Other Ambulatory Visit: Payer: Self-pay | Admitting: Family Medicine

## 2023-12-24 ENCOUNTER — Telehealth: Payer: Self-pay | Admitting: Internal Medicine

## 2023-12-24 NOTE — Telephone Encounter (Signed)
Patient is returning phone call. Patient states received the paperwork in the mail. Patient phone number is [phone].

## 2023-12-26 NOTE — Telephone Encounter (Signed)
NFN patient just informing she did receive paperwork.

## 2024-01-14 ENCOUNTER — Ambulatory Visit: Payer: Medicare Other | Attending: Primary Care

## 2024-01-14 DIAGNOSIS — Z87891 Personal history of nicotine dependence: Secondary | ICD-10-CM | POA: Diagnosis not present

## 2024-01-14 DIAGNOSIS — R42 Dizziness and giddiness: Secondary | ICD-10-CM | POA: Insufficient documentation

## 2024-01-14 DIAGNOSIS — R059 Cough, unspecified: Secondary | ICD-10-CM | POA: Insufficient documentation

## 2024-01-14 DIAGNOSIS — R5383 Other fatigue: Secondary | ICD-10-CM | POA: Diagnosis not present

## 2024-01-14 DIAGNOSIS — R0600 Dyspnea, unspecified: Secondary | ICD-10-CM | POA: Insufficient documentation

## 2024-01-14 DIAGNOSIS — J4550 Severe persistent asthma, uncomplicated: Secondary | ICD-10-CM

## 2024-01-14 LAB — PULMONARY FUNCTION TEST ARMC ONLY
DL/VA % pred: 48 %
DL/VA: 2.03 ml/min/mmHg/L
DLCO unc % pred: 58 %
DLCO unc: 9.53 ml/min/mmHg
FEF 25-75 Post: 1.27 L/s
FEF 25-75 Pre: 1.91 L/s
FEF2575-%Change-Post: -33 %
FEF2575-%Pred-Post: 112 %
FEF2575-%Pred-Pre: 168 %
FEV1-%Change-Post: -22 %
FEV1-%Pred-Post: 110 %
FEV1-%Pred-Pre: 142 %
FEV1-Post: 1.71 L
FEV1-Pre: 2.19 L
FEV1FVC-%Change-Post: -21 %
FEV1FVC-%Pred-Pre: 103 %
FEV6-%Change-Post: -1 %
FEV6-%Pred-Post: 144 %
FEV6-%Pred-Pre: 147 %
FEV6-Post: 2.84 L
FEV6-Pre: 2.89 L
FEV6FVC-%Pred-Post: 106 %
FEV6FVC-%Pred-Pre: 106 %
FVC-%Change-Post: 0 %
FVC-%Pred-Post: 137 %
FVC-%Pred-Pre: 138 %
FVC-Post: 2.88 L
FVC-Pre: 2.89 L
Post FEV1/FVC ratio: 59 %
Post FEV6/FVC ratio: 100 %
Pre FEV1/FVC ratio: 76 %
Pre FEV6/FVC Ratio: 100 %
RV % pred: 130 %
RV: 2.88 L
TLC % pred: 133 %
TLC: 5.94 L

## 2024-01-14 MED ORDER — ALBUTEROL SULFATE (2.5 MG/3ML) 0.083% IN NEBU
2.5000 mg | INHALATION_SOLUTION | Freq: Once | RESPIRATORY_TRACT | Status: AC
  Administered 2024-01-14: 2.5 mg via RESPIRATORY_TRACT
  Filled 2024-01-14: qty 3

## 2024-01-17 ENCOUNTER — Other Ambulatory Visit: Payer: Self-pay | Admitting: Family Medicine

## 2024-01-28 ENCOUNTER — Ambulatory Visit (INDEPENDENT_AMBULATORY_CARE_PROVIDER_SITE_OTHER): Payer: Medicare Other | Admitting: Family Medicine

## 2024-01-28 ENCOUNTER — Encounter: Payer: Self-pay | Admitting: Family Medicine

## 2024-01-28 VITALS — BP 138/62 | HR 82 | Temp 98.3°F | Ht 60.25 in | Wt 111.4 lb

## 2024-01-28 DIAGNOSIS — I10 Essential (primary) hypertension: Secondary | ICD-10-CM

## 2024-01-28 DIAGNOSIS — E876 Hypokalemia: Secondary | ICD-10-CM | POA: Diagnosis not present

## 2024-01-28 LAB — BASIC METABOLIC PANEL
BUN: 16 mg/dL (ref 6–23)
CO2: 29 meq/L (ref 19–32)
Calcium: 10.2 mg/dL (ref 8.4–10.5)
Chloride: 99 meq/L (ref 96–112)
Creatinine, Ser: 0.72 mg/dL (ref 0.40–1.20)
GFR: 78.26 mL/min (ref >60.00)
Glucose, Bld: 92 mg/dL (ref 70–99)
Potassium: 3.2 meq/L — ABNORMAL LOW (ref 3.5–5.1)
Sodium: 137 meq/L (ref 135–145)

## 2024-01-28 NOTE — Assessment & Plan Note (Addendum)
 Chronic, well controlled  on HCTZ 25 mg p.o. daily.  No side effects. She does not want to stop this as it helps with swelling.   She is willing to take daily low dose potassium if again low today.

## 2024-01-28 NOTE — Progress Notes (Signed)
 Patient ID: Courtney Chandler, female    DOB: Aug 18, 1941, 83 y.o.   MRN: [id number]  This visit was conducted in person.  BP 138/62   Pulse 82   Temp 98.3 F (36.8 C) (Oral)   Ht 5' 0.25" (1.53 m)   Wt 111 lb 6.4 oz (50.5 kg)   SpO2 98%   BMI 21.58 kg/m    CC:  Chief Complaint  Patient presents with   3 month follow up     Subjective:   HPI: Courtney Chandler is a 83 y.o. female presenting on 01/28/2024 for 3 month follow up    She has had to move suddenly. Has been more tired lately as she has been packing.   Has appt tomorrow with Cardiology Dr. Thornell Mule -Sandie Ano to discuss abnormal [address]  Score.   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   Hypokalemia.Marland Kitchen due for re-check.. likely due to hydrochlorothiazide. Not currently on potassium pills.  BP Readings from Last 3 Encounters:  01/28/24 138/62  11/26/23 128/60  10/30/23 138/60      Relevant past medical, surgical, family and social history reviewed and updated as indicated. Interim medical history since our last visit reviewed. Allergies and medications reviewed and updated. Outpatient Medications Prior to Visit  Medication Sig Dispense Refill   acetaminophen (TYLENOL) 500 MG tablet Take 1,000 mg by mouth as needed.     albuterol (VENTOLIN HFA) 108 (90 Base) MCG/ACT inhaler INHALE 2 PUFFS BY MOUTH EVERY 6 HOURS AS NEEDED FOR WHEEZING AND FOR SHORTNESS OF BREATH 9 g 2   aspirin EC 81 MG tablet Take 81 mg by mouth daily. Swallow whole.     Calcium Carbonate-Vitamin D 600-400 MG-UNIT tablet Take 1 tablet by mouth daily.     cetirizine (ZYRTEC) 10 MG tablet Take 10 mg by mouth daily as needed for allergies.     Dextromethorphan-Guaifenesin 20-400 MG TABS Take by mouth 4 (four) times daily as needed.     DOCOSAHEXAENOIC ACID PO Take by mouth.     Esomeprazole Magnesium (NEXIUM PO) Take 20 mg by mouth daily.     ezetimibe (ZETIA) 10 MG tablet Take 1 tablet by mouth once daily 90  tablet 3   Fluticasone-Umeclidin-Vilant (TRELEGY ELLIPTA) 200-62.5-25 MCG/ACT AEPB INHALE 1 PUFF ONCE DAILY IN  THE  AFTERNOON 60 each 3   Glucosamine-Chondroitin-MSM 500-400-300 MG TABS Take 1 tablet by mouth 2 (two) times daily.     hydrochlorothiazide (HYDRODIURIL) 25 MG tablet Take 1 tablet by mouth once daily 90 tablet 0   latanoprost (XALATAN) 0.005 % ophthalmic solution Place 1 drop into the left eye at bedtime.      Lidocaine HCl (LIDAFLEX) 4 % PTCH Apply 1 patch topically in the morning.     Melatonin 5 MG TABS Take 20-40 mg by mouth at bedtime.      montelukast (SINGULAIR) 10 MG tablet TAKE 1 TABLET BY MOUTH AT BEDTIME 90 tablet 3   Multiple Vitamins-Minerals (MULTIVITAMIN WITH MINERALS) tablet Take 1 tablet by mouth daily.     OVER THE COUNTER MEDICATION CBD- sublingual for sleep every night and topically as needed for pain     Sennosides-Docusate Sodium 8.6-50 MG CAPS Take 3 capsules by mouth daily.      Fluticasone-Umeclidin-Vilant (TRELEGY ELLIPTA) 200-62.5-25 MCG/ACT AEPB Inhale 1 puff into the lungs daily at 6 (six) AM. 60 each 6   potassium chloride SA (KLOR-CON M) 20 MEQ tablet Take 1  tablet (20 mEq total) by mouth daily. (Patient not taking: Reported on 11/26/2023) 14 tablet 0   No facility-administered medications prior to visit.     Per HPI unless specifically indicated in ROS section below Review of Systems  Constitutional:  Negative for fatigue and fever.  HENT:  Negative for congestion and ear pain.   Eyes:  Negative for pain.  Respiratory:  Positive for shortness of breath. Negative for cough and chest tightness.   Cardiovascular:  Negative for chest pain, palpitations and leg swelling.  Gastrointestinal:  Negative for abdominal pain.  Genitourinary:  Negative for dysuria and vaginal bleeding.  Musculoskeletal:  Negative for back pain.  Neurological:  Positive for dizziness and weakness. Negative for syncope, light-headedness and headaches.  Psychiatric/Behavioral:   Negative for dysphoric mood.    Objective:  BP 138/62   Pulse 82   Temp 98.3 F (36.8 C) (Oral)   Ht 5' 0.25" (1.53 m)   Wt 111 lb 6.4 oz (50.5 kg)   SpO2 98%   BMI 21.58 kg/m   Wt Readings from Last 3 Encounters:  01/28/24 111 lb 6.4 oz (50.5 kg)  11/26/23 110 lb (49.9 kg)  10/30/23 113 lb 2 oz (51.3 kg)      Physical Exam Constitutional:      General: She is not in acute distress.    Appearance: Normal appearance. She is well-developed. She is not ill-appearing or toxic-appearing.  HENT:     Head: Normocephalic.     Right Ear: Hearing and external ear normal. Tympanic membrane is not erythematous, retracted or bulging.     Left Ear: Hearing and external ear normal. Tympanic membrane is not erythematous, retracted or bulging.     Nose: No mucosal edema or rhinorrhea.     Right Sinus: No maxillary sinus tenderness or frontal sinus tenderness.     Left Sinus: No maxillary sinus tenderness or frontal sinus tenderness.     Mouth/Throat:     Pharynx: Uvula midline.  Eyes:     General: Lids are normal. Lids are everted, no foreign bodies appreciated.     Conjunctiva/sclera: Conjunctivae normal.     Pupils: Pupils are equal, round, and reactive to light.  Neck:     Thyroid: No thyroid mass or thyromegaly.     Vascular: No carotid bruit.     Trachea: Trachea normal.  Cardiovascular:     Rate and Rhythm: Normal rate and regular rhythm.     Pulses: Normal pulses.     Heart sounds: S1 normal and S2 normal. Murmur heard.     No friction rub. No gallop.  Pulmonary:     Effort: Pulmonary effort is normal. No tachypnea or respiratory distress.     Breath sounds: Normal breath sounds. No decreased breath sounds, wheezing, rhonchi or rales.  Abdominal:     Tenderness: There is abdominal tenderness.  Neurological:     Mental Status: She is alert.  Psychiatric:        Mood and Affect: Mood is not anxious or depressed.        Speech: Speech normal.        Behavior: Behavior  normal. Behavior is cooperative.        Thought Content: Thought content normal.        Judgment: Judgment normal.       Results for orders placed or performed in visit on 01/14/24  Pulmonary Function Test [facility] Only   Collection Time: 01/14/24 11:35 AM  Result Value Ref Range  FVC-Pre 2.89 L   FVC-%Pred-Pre 138 %   FVC-Post 2.88 L   FVC-%Pred-Post 137 %   FVC-%Change-Post 0 %   FEV1-Pre 2.19 L   FEV1-%Pred-Pre 142 %   FEV1-Post 1.71 L   FEV1-%Pred-Post 110 %   FEV1-%Change-Post -22 %   FEV6-Pre 2.89 L   FEV6-%Pred-Pre 147 %   FEV6-Post 2.84 L   FEV6-%Pred-Post 144 %   FEV6-%Change-Post -1 %   Pre FEV1/FVC ratio 76 %   FEV1FVC-%Pred-Pre 103 %   Post FEV1/FVC ratio 59 %   FEV1FVC-%Change-Post -21 %   Pre FEV6/FVC Ratio 100 %   FEV6FVC-%Pred-Pre 106 %   Post FEV6/FVC ratio 100 %   FEV6FVC-%Pred-Post 106 %   FEF 25-75 Pre 1.91 L/sec   FEF2575-%Pred-Pre 168 %   FEF 25-75 Post 1.27 L/sec   FEF2575-%Pred-Post 112 %   FEF2575-%Change-Post -33 %   RV 2.88 L   RV % pred 130 %   TLC 5.94 L   TLC % pred 133 %   DLCO unc 9.53 ml/min/mmHg   DLCO unc % pred 58 %   DL/VA 1.61 ml/min/mmHg/L   DL/VA % pred 48 %    Assessment and Plan  Hypokalemia Assessment & Plan:  Due for re-check,likely secondary to hydrochlorothiazide.  No longer taking KCL... has increased K in diet.  Orders: -     Basic metabolic panel  Essential hypertension, benign Assessment & Plan: Chronic, well controlled  on HCTZ 25 mg p.o. daily.  No side effects. She does not want to stop this as it helps with swelling.   She is willing to take daily low dose potassium if again low today.        No follow-ups on file.   Kerby Nora, MD

## 2024-01-28 NOTE — Assessment & Plan Note (Addendum)
 Due for re-check,likely secondary to hydrochlorothiazide.  No longer taking KCL... has increased K in diet.

## 2024-01-29 ENCOUNTER — Encounter: Payer: Self-pay | Admitting: Family Medicine

## 2024-01-29 ENCOUNTER — Ambulatory Visit: Payer: Medicare Other | Attending: Cardiology | Admitting: Cardiology

## 2024-01-29 ENCOUNTER — Encounter: Payer: Self-pay | Admitting: Cardiology

## 2024-01-29 VITALS — BP 116/50 | HR 90 | Ht 60.0 in | Wt 110.8 lb

## 2024-01-29 DIAGNOSIS — I10 Essential (primary) hypertension: Secondary | ICD-10-CM | POA: Diagnosis not present

## 2024-01-29 DIAGNOSIS — I2510 Atherosclerotic heart disease of native coronary artery without angina pectoris: Secondary | ICD-10-CM | POA: Insufficient documentation

## 2024-01-29 DIAGNOSIS — E782 Mixed hyperlipidemia: Secondary | ICD-10-CM | POA: Insufficient documentation

## 2024-01-29 MED ORDER — FENOFIBRATE 145 MG PO TABS
145.0000 mg | ORAL_TABLET | Freq: Every day | ORAL | 3 refills | Status: AC
Start: 2024-01-29 — End: ?

## 2024-01-29 NOTE — Progress Notes (Signed)
 Cardiology Office Note:    Date:  01/29/2024   ID:  Courtney Chandler, DOB Nov 24, 1940, MRN [id number]  PCP:  Excell Seltzer, MD   [facility] Providers Cardiologist:  Debbe Odea, MD     Referring MD: Excell Seltzer, MD   No chief complaint on file.   History of Present Illness:    Courtney Chandler is a 83 y.o. female with a hx of CAD (LAD, RCA, LCx calcifications on chest CT), hypertension, GERD, ILD, postpolio syndrome (initial polio diagnosis at age 12) who presents due to CAD.  Had a coronary calcium scan showing elevated coronary calcifications.  Started on aspirin, continued on Zetia.  Patient has a history of pancreatitis with statins.  Triglycerides mildly elevated, LDL and total cholesterol controlled.  Denies chest pain, able to perform activities of daily living without any adverse effects.  Recently diagnosed with interstitial lung disease, has lifelong asthma uses inhalers for this.  Has appointment with pulmonary medicine upcoming.  Coronary calcium scan 12/24 calcium score 2894, 98 percentile.  Past Medical History:  Diagnosis Date   Allergy    Anxiety    associated with dyphagia   Arthritis    osteo of left hip   Asthma    moderate persistant   Complication of anesthesia    extended anesthesia effect because of post polio syndrome   Deviated septum    2002   Hypertension    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   Osteophyte of cervical spine    pushing on esoghagus causing dysphagia   Post-polio syndrome    Scarlet fever    Sleep apnea    severe - CPAP   Wears dentures    lower partial    Past Surgical History:  Procedure Laterality Date   APPENDECTOMY     CATARACT EXTRACTION W/PHACO Left 02/07/2016   Procedure: CATARACT EXTRACTION PHACO AND INTRAOCULAR LENS PLACEMENT (IOC);  Surgeon: Lockie Mola, MD;  Location: [facility];  Service: Ophthalmology;  Laterality: Left;  CPAP   CATARACT EXTRACTION W/PHACO Right  03/06/2016   Procedure: CATARACT EXTRACTION PHACO AND INTRAOCULAR LENS PLACEMENT (IOC);  Surgeon: Lockie Mola, MD;  Location: [facility];  Service: Ophthalmology;  Laterality: Right;  CPAP   CHOLECYSTECTOMY     COLONOSCOPY WITH ESOPHAGOGASTRODUODENOSCOPY (EGD)     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   albuterol (VENTOLIN HFA) 108 (90 Base) MCG/ACT inhaler INHALE 2 PUFFS BY MOUTH EVERY 6 HOURS AS NEEDED FOR WHEEZING AND FOR SHORTNESS OF BREATH   aspirin EC 81 MG tablet Take 81 mg by mouth daily. Swallow whole.   Calcium Carbonate-Vitamin D 600-400 MG-UNIT tablet Take 1 tablet by mouth daily.   cetirizine (ZYRTEC) 10 MG tablet Take 10 mg by mouth daily as needed for allergies.   Dextromethorphan-Guaifenesin 20-400 MG TABS Take by mouth 4 (four) times daily as needed.   DOCOSAHEXAENOIC ACID PO Take by mouth.   Esomeprazole Magnesium (NEXIUM PO) Take 20 mg by mouth daily.   ezetimibe (ZETIA) 10 MG tablet Take 1 tablet by mouth once daily   fenofibrate (TRICOR) 145 MG tablet Take 1 tablet (145 mg total) by mouth daily.   Fluticasone-Umeclidin-Vilant (TRELEGY ELLIPTA) 200-62.5-25 MCG/ACT AEPB INHALE 1 PUFF ONCE DAILY IN  THE  AFTERNOON   Glucosamine-Chondroitin-MSM 500-400-300 MG TABS Take 1 tablet by mouth 2 (two) times daily.   hydrochlorothiazide (HYDRODIURIL) 25 MG tablet Take 1 tablet by mouth once daily   latanoprost (XALATAN) 0.005 % ophthalmic solution Place 1 drop into the left eye at bedtime.    Lidocaine HCl (LIDAFLEX) 4 % PTCH Apply 1 patch topically in the morning.   Melatonin 5 MG TABS Take 20-40 mg by mouth at bedtime.    montelukast (SINGULAIR) 10 MG tablet TAKE 1 TABLET BY MOUTH AT BEDTIME   Multiple Vitamins-Minerals (MULTIVITAMIN WITH MINERALS) tablet Take 1 tablet by mouth daily.   OVER THE COUNTER MEDICATION CBD-  sublingual for sleep every night and topically as needed for pain   Sennosides-Docusate Sodium 8.6-50 MG CAPS Take 3 capsules by mouth daily.      Allergies:   Statins, Beta adrenergic blockers, Codeine, and Erythromycin   Social History   Socioeconomic History   Marital status: Single    Spouse name: Not on file   Number of children: Not on file   Years of education: Not on file   Highest education level: Associate degree: academic program  Occupational History   Occupation: disabled    Comment: retired Charity fundraiser  Tobacco Use   Smoking status: Former    Current packs/day: 0.00    Average packs/day: 0.5 packs/day for 8.0 years (4.0 ttl pk-yrs)    Types: Cigarettes    Start date: 48    Quit date: 1980    Years since quitting: 45.2   Smokeless tobacco: Never   Tobacco comments:    quit 1981  Vaping Use   Vaping status: Never Used  Substance and Sexual Activity   Alcohol use: Yes    Comment: very rare wine (2x/yr)   Drug use: No   Sexual activity: Not on file  Other Topics Concern   Not on file  Social History Narrative   Exercise -Lifting weights with arms, swimming       Diet healthy      End of Life: has completed MOST.. Wants to be DNR. (reviewed 2014)   Living will in place.   Social Drivers of Corporate investment banker Strain: Low Risk  (10/29/2023)   Overall Financial Resource Strain (CARDIA)    Difficulty of Paying Living Expenses: Not very hard  Food Insecurity: No Food Insecurity (10/29/2023)   Hunger Vital Sign    Worried About Running Out of Food in the Last Year: Never true    Ran Out of Food in the Last Year: Never true  Transportation Needs: No Transportation Needs (10/29/2023)   PRAPARE - Administrator, Civil Service (Medical): No    Lack of Transportation (Non-Medical): No  Physical Activity: Insufficiently Active (10/29/2023)   Exercise Vital Sign    Days of Exercise per Week: 4 days    Minutes of Exercise per Session: 30 min   Stress: Stress Concern Present (10/29/2023)   [employer] of Occupational Health - Occupational Stress Questionnaire    Feeling of Stress : To some extent  Social Connections: Socially Isolated (10/29/2023)   Social Connection and Isolation Panel [NHANES]    Frequency of Communication with Friends and Family: Three times a week    Frequency of Social Gatherings with Friends and Family: Once a week    Attends Religious Services: Never    Database administrator or Organizations: No    Attends Banker Meetings: Not on file  Marital Status: Never married     Family History: The patient's family history includes Alcohol abuse in her father and mother; Cancer in her maternal grandfather, maternal grandmother, and mother; Diabetes in her father and paternal grandmother.  ROS:   Please see the history of present illness.     All other systems reviewed and are negative.  EKGs/Labs/Other Studies Reviewed:    The following studies were reviewed today:  EKG Interpretation Date/Time:  Thursday January 29 2024 11:01:05 EDT Ventricular Rate:  95 PR Interval:  128 QRS Duration:  88 QT Interval:  374 QTC Calculation: 469 R Axis:   32  Text Interpretation: Normal sinus rhythm Nonspecific T wave abnormality Confirmed by Debbe Odea ([id number]) on 01/29/2024 11:05:58 AM    Recent Labs: 11/26/2023: Magnesium 2.0 12/10/2023: ALT 13; Hemoglobin 14.0; Platelets 297.0 01/28/2024: BUN 16; Creatinine, Ser 0.72; Potassium 3.2; Sodium 137  Recent Lipid Panel    Component Value Date/Time   CHOL 198 10/30/2023 1158   TRIG 298.0 (H) 10/30/2023 1158   HDL 80.50 10/30/2023 1158   CHOLHDL 2 10/30/2023 1158   VLDL 59.6 (H) 10/30/2023 1158   LDLCALC 58 10/30/2023 1158   LDLDIRECT 99.0 10/21/2019 0832     Risk Assessment/Calculations:             Physical Exam:    VS:  BP (!) 116/50 (BP Location: Left Arm, Patient Position: Sitting, Cuff Size: Normal)   Pulse 90   Ht 5'  (1.524 m)   Wt 110 lb 12.8 oz (50.3 kg)   SpO2 95%   BMI 21.64 kg/m     Wt Readings from Last 3 Encounters:  01/29/24 110 lb 12.8 oz (50.3 kg)  01/28/24 111 lb 6.4 oz (50.5 kg)  11/26/23 110 lb (49.9 kg)     GEN:  Well nourished, well developed in no acute distress HEENT: Normal NECK: No JVD; No carotid bruits CARDIAC: RRR, no murmurs, rubs, gallops RESPIRATORY:  Clear to auscultation without rales, wheezing or rhonchi  ABDOMEN: Soft, non-tender, non-distended MUSCULOSKELETAL:  No edema; No deformity  SKIN: Warm and dry NEUROLOGIC:  Alert and oriented x 3 PSYCHIATRIC:  Normal affect   ASSESSMENT:    1. Coronary artery disease involving native coronary artery of native heart, unspecified whether angina present   2. Primary hypertension   3. Mixed hyperlipidemia    PLAN:    In order of problems listed above:  CAD, three-vessel coronary calcifications, calcium score 2894, 98 percentile.  Continue aspirin 81 mg daily, Zetia 10 mg daily.  Obtain echocardiogram.  Patient denies chest pain.  Consider ischemic workup if patient becomes symptomatic. Hypertension, BP controlled.  Continue HCTZ 25 mg daily. Hyperlipidemia, triglycerides elevated.  LDL and total cholesterol at goal.  History of pancreatitis with statins.  Continue Zetia 10 mg daily, start fenofibrate.  Plan to recheck lipid panel in 2 to 3 months.  Follow-up after echo      Medication Adjustments/Labs and Tests Ordered: Current medicines are reviewed at length with the patient today.  Concerns regarding medicines are outlined above.  Orders Placed This Encounter  Procedures   EKG 12-Lead   ECHOCARDIOGRAM COMPLETE   Meds ordered this encounter  Medications   fenofibrate (TRICOR) 145 MG tablet    Sig: Take 1 tablet (145 mg total) by mouth daily.    Dispense:  90 tablet    Refill:  3    Patient Instructions  Medication Instructions:  Start Fenofibrate 145 mg once day *If you need a  refill on your cardiac  medications before your next appointment, please call your pharmacy*  Lab Work: No labs  Testing/Procedures: Your physician has requested that you have an echocardiogram. Echocardiography is a painless test that uses sound waves to create images of your heart. It provides your doctor with information about the size and shape of your heart and how well your heart's chambers and valves are working. This procedure takes approximately one hour. There are no restrictions for this procedure. Please do NOT wear cologne, perfume, aftershave, or lotions (deodorant is allowed). Please arrive 15 minutes prior to your appointment time.  Please note: We ask at that you not bring children with you during ultrasound (echo/ vascular) testing. Due to room size and safety concerns, children are not allowed in the ultrasound rooms during exams. Our front office staff cannot provide observation of children in our lobby area while testing is being conducted. An adult accompanying a patient to their appointment will only be allowed in the ultrasound room at the discretion of the ultrasound technician under special circumstances. We apologize for any inconvenience.  Follow-Up: At [facility], you and your health needs are our priority.  As part of our continuing mission to provide you with exceptional heart care, we have created designated Provider Care Teams.  These Care Teams include your primary Cardiologist (physician) and Advanced Practice Providers (APPs -  Physician Assistants and Nurse Practitioners) who all work together to provide you with the care you need, when you need it.  We recommend signing up for the patient portal called "MyChart".  Sign up information is provided on this After Visit Summary.  MyChart is used to connect with patients for Virtual Visits (Telemedicine).  Patients are able to view lab/test results, encounter notes, upcoming appointments, etc.  Non-urgent messages can be sent to  your provider as well.   To learn more about what you can do with MyChart, go to [email or web address].    Your next appointment:   2-3 month(s)  Provider:   Debbe Odea, MD    Signed, Debbe Odea, MD  01/29/2024 12:10 PM    [facility]

## 2024-01-29 NOTE — Patient Instructions (Signed)
 Medication Instructions:  Start Fenofibrate 145 mg once day *If you need a refill on your cardiac medications before your next appointment, please call your pharmacy*  Lab Work: No labs  Testing/Procedures: Your physician has requested that you have an echocardiogram. Echocardiography is a painless test that uses sound waves to create images of your heart. It provides your doctor with information about the size and shape of your heart and how well your heart's chambers and valves are working. This procedure takes approximately one hour. There are no restrictions for this procedure. Please do NOT wear cologne, perfume, aftershave, or lotions (deodorant is allowed). Please arrive 15 minutes prior to your appointment time.  Please note: We ask at that you not bring children with you during ultrasound (echo/ vascular) testing. Due to room size and safety concerns, children are not allowed in the ultrasound rooms during exams. Our front office staff cannot provide observation of children in our lobby area while testing is being conducted. An adult accompanying a patient to their appointment will only be allowed in the ultrasound room at the discretion of the ultrasound technician under special circumstances. We apologize for any inconvenience.  Follow-Up: At [facility], you and your health needs are our priority.  As part of our continuing mission to provide you with exceptional heart care, we have created designated Provider Care Teams.  These Care Teams include your primary Cardiologist (physician) and Advanced Practice Providers (APPs -  Physician Assistants and Nurse Practitioners) who all work together to provide you with the care you need, when you need it.  We recommend signing up for the patient portal called "MyChart".  Sign up information is provided on this After Visit Summary.  MyChart is used to connect with patients for Virtual Visits (Telemedicine).  Patients are able to view  lab/test results, encounter notes, upcoming appointments, etc.  Non-urgent messages can be sent to your provider as well.   To learn more about what you can do with MyChart, go to [email or web address].    Your next appointment:   2-3 month(s)  Provider:   Debbe Odea, MD

## 2024-02-02 NOTE — Telephone Encounter (Signed)
 Copied from CRM [id number]. Topic: Clinical - Lab/Test Results >> Feb 02, 2024  1:24 PM Adaysia C wrote: Reason for CRM: Patient received an undocumented missed call from Lupita Leash about her recent lab work; please follow up with patient when available [phone]

## 2024-02-03 MED ORDER — TRIAMTERENE-HCTZ 37.5-25 MG PO TABS: 1.0000 | ORAL_TABLET | Freq: Every day | ORAL | 3 refills | Status: AC

## 2024-02-03 NOTE — Telephone Encounter (Signed)
 Copied from CRM [id number]. Topic: Clinical - Medication Question >> Feb 03, 2024  8:38 AM Sim Boast F wrote: Reason for CRM: Patient wants to let Dr. Ermalene Searing know that she would like to start the new medication for her blood pressure. [facility] pharmacy on file is okay.

## 2024-02-03 NOTE — Addendum Note (Signed)
 Addended by: Kerby Nora E on: 02/03/2024 02:18 PM   Modules accepted: Orders

## 2024-02-09 NOTE — Progress Notes (Unsigned)
 OV 02/12/2024  Subjective:  Patient ID: Courtney Chandler, female , DOB: 16-Nov-1940 , age 83 y.o. , MRN: [id number] , ADDRESS: [address] [place] [address] PCP Excell Seltzer, MD Patient Care Team: Excell Seltzer, MD as PCP - General Debbe Odea, MD as PCP - Cardiology (Cardiology) Lockie Mola, MD as Referring Physician (Ophthalmology) Bartholomew Boards, DDS as Referring Physician (Dentistry)  This Provider for this visit: Treatment Team:  Attending Provider: Kalman Shan, MD    02/12/2024 -   Chief Complaint  Patient presents with   Follow-up    ILD- Former pt of Dr. Craige Cotta. Breathing is unchanged since last visit. She c/o feeling dizzy today.      HPI Courtney Chandler Shewell 83 y.o. -is a retired former Engineer, civil (consulting).  She had polio and therefore uses braces.  She used to live in [place] but then moved to be with her son 18 years ago in 2008.  At least live near her son.  She has grown grandchildren now in the area.  She says she is always had some amount of shortness of breath even in [place] and periodic episodes of dizziness.  Is been going on for 30 years.  It is on and off and not all the time.  She has been referred for concern of interstitial lung disease.  She lives alone and her son lives nearby.  She has both shortness of breath and cough.  Cough itself started years ago.  Over time its gotten worse.  There are some clear sputum.  She does cough at night.  She does not have any wheezing with the cough no hemoptysis does not get worse when she lies down.  He does affect her voice and she does clear her throat but there is no tickle in the cough.  Of note she has had intermittent dizziness as well along with the shortness of breath for many years.  This is largely unexplained.   [facility] Comprehensive ILD Questionnaire  Symptoms:   SYMPTOM SCALE - ILD 02/12/2024  Current weight   O2 use ra  Shortness of Breath 0 -> 5 scale  with 5 being worst (score 6 If unable to do)  At rest 2  Simple tasks - showers, clothes change, eating, shaving 2  Household (dishes, doing bed, laundry) 2  Shopping 2  Walking level at own pace 2  Walking up Stairs 2  Total (30-36) Dyspnea Score 12      Non-dyspnea symptoms (0-> 5 scale) 02/12/2024  How bad is your cough? 1  How bad is your fatigue 2  How bad is nausea 0  How bad is vomiting?  1  How bad is diarrhea? 0  How bad is anxiety? 1  How bad is depression 0  Any chronic pain - if so where and how bad 0     Past Medical History :  -She suffered polio at age 63 and then again at age 46.  She wears braces.  She has some diplopia. - She reports a history of on and off asthma for several decades. - No COPD.  No heart disease no connective tissue disease That she reported yes for polymyositis in the form but did not indicate that to me in the history That she does have esophagitis and small hiatal hernia - No sleep apnea no pulmonary hypertension That she does have coronary artery calcification - She has had COVID-vaccine but not the COVID  disease. -She does have sleep apnea and sees Buelah Manis the nurse practitioner   ROS:  -Positive for fatigue arthralgia some difficulty swallowing fluids.  She also has dry eyes on and off nausea.  She does have some snoring  FAMILY HISTORY of LUNG DISEASE:  -She does have a cousin with autoimmune disease lupus/scleroderma.  Otherwise family history negative for pulmonary disease  PERSONAL EXPOSURE HISTORY:  -Started smoking between 1958 and 1979.  1 to 10 cigarettes/day.  Otherwise no cigarettes no electronic cigarettes no vaping no marijuana currently but in 1980 in 1982 she did do some marijuana.  HOME  EXPOSURE and HOBBY DETAILS :  -[place].  The age of the home is 19 years.  Before that lived in a single-family home.  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  Detail organic and inorganic antigen history exposure is negative  PULMONARY TOXICITY HISTORY (27 items):  Neck but she did take some beta-blockers few years ago and had allergic reaction she is taking hydrochlorothiazide  INVESTIGATIONS: x     CT Chest data from date: *  - personally visualized an independently interpreted : yes - my findings are: ILD versus ILD Narrative & Impression  CLINICAL DATA:  83 year old female with history of chronic cough.   EXAM: CT CHEST WITHOUT CONTRAST   TECHNIQUE: Multidetector CT imaging of the chest was performed following the standard protocol without intravenous contrast. High resolution imaging of the lungs, as well as inspiratory and expiratory imaging, was performed.   RADIATION DOSE REDUCTION: This exam was performed according to the departmental dose-optimization program which includes automated exposure control, adjustment of the mA and/or kV according to patient size and/or use of iterative reconstruction technique.   COMPARISON:  No priors.   FINDINGS: Cardiovascular: Heart size is normal. There is no significant pericardial fluid, thickening or pericardial calcification. There is aortic atherosclerosis, as well as atherosclerosis of the great vessels of the mediastinum and the coronary arteries, including calcified atherosclerotic plaque in the left main, left anterior descending, left circumflex and right coronary arteries.   Mediastinum/Nodes: No pathologically enlarged mediastinal or hilar lymph nodes. Moderate-sized hiatal hernia. No axillary lymphadenopathy.   Lungs/Pleura: Small noncalcified pleural plaques are noted bilaterally (right greater than left). No calcified pleural plaques are noted.  High-resolution images demonstrate very mild areas of ground-glass attenuation and septal thickening scattered throughout the lungs bilaterally, most evident in the lung bases. No definite traction bronchiectasis or honeycombing. Inspiratory and expiratory imaging demonstrates some mild air trapping indicative of mild small airways disease. No acute consolidative airspace disease. No pleural effusions. No definite suspicious appearing pulmonary nodules or masses are noted. Scattered calcified granulomas are evident.   Upper Abdomen: Aortic atherosclerosis.  Status post cholecystectomy.   Musculoskeletal: There are no aggressive appearing lytic or blastic lesions noted in the visualized portions of the skeleton.   IMPRESSION: 1. The appearance of the lungs may suggest early or mild interstitial lung disease, with a spectrum of findings considered indeterminate for usual interstitial pneumonia (UIP) per current ATS guidelines. 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 Repeat high-resolution chest CT is recommended in 12 months to assess for temporal changes  in the appearance of the lung parenchyma. 2. Small noncalcified pleural plaques bilaterally, nonspecific. Attention at time of repeat high-resolution chest CT is recommended to ensure stability. 3. Aortic atherosclerosis, in addition to left main and three-vessel coronary artery disease. Please note that although the presence of coronary artery calcium documents the presence of coronary artery disease, the severity of this disease and any potential stenosis cannot be assessed on this non-gated CT examination. Assessment for potential risk factor modification, dietary therapy or pharmacologic therapy may be warranted, if clinically indicated.   Aortic Atherosclerosis (ICD10-I70.0).     Electronically Signed   By: Trudie Reed M.D.   On: 10/22/2023 07:40    PFT     Latest  Ref Rng & Units 01/14/2024   11:35 AM 06/20/2022    3:00 PM  PFT Results  FVC-Pre L 2.89  2.90   FVC-Predicted Pre % 138  135   FVC-Post L 2.88  3.04   FVC-Predicted Post % 137  142   Pre FEV1/FVC % % 76  74   Post FEV1/FCV % % 59  74   FEV1-Pre L 2.19  2.15   FEV1-Predicted Pre % 142  136   FEV1-Post L 1.71  2.26   DLCO uncorrected ml/min/mmHg 9.53  10.66   DLCO UNC% % 58  65   DLVA Predicted % 48  52   TLC L 5.94  4.91   TLC % Predicted % 133  110   RV % Predicted % 130  83        LAB RESULTS last 96 hours No results found.       has a past medical history of Allergy, Anxiety, Arthritis, Asthma, Complication of anesthesia, Deviated septum, Hypertension, Leg length inequality, Osteophyte of cervical spine, Post-polio syndrome, Scarlet fever, Sleep apnea, and Wears dentures.   reports that she quit smoking about 45 years ago. Her smoking use included cigarettes. She started smoking about 53 years ago. She has a 4 pack-year smoking history. She has never used smokeless tobacco.  Past Surgical History:  Procedure Laterality Date   APPENDECTOMY     CATARACT EXTRACTION W/PHACO Left 02/07/2016   Procedure: CATARACT EXTRACTION PHACO AND INTRAOCULAR LENS PLACEMENT (IOC);  Surgeon: Lockie Mola, MD;  [facility];  Service: Ophthalmology;  Laterality: Left;  CPAP   CATARACT EXTRACTION W/PHACO Right 03/06/2016   Procedure: CATARACT EXTRACTION PHACO AND INTRAOCULAR LENS PLACEMENT (IOC);  Surgeon: Lockie Mola, MD;  Location: [facility];  Service: Ophthalmology;  Laterality: Right;  CPAP   CHOLECYSTECTOMY     COLONOSCOPY WITH ESOPHAGOGASTRODUODENOSCOPY (EGD)     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   NASAL SEPTUM SURGERY      Allergies  Allergen Reactions   Statins      pancreatitis   Beta Adrenergic Blockers     Asthma exacerbation   Codeine     REACTION: Nausea and vomiting   Erythromycin     REACTION: Nausea and vomiting    Immunization  History  Administered Date(s) Administered   Fluad Quad(high Dose 65+) 08/05/2019, 07/28/2020, 08/31/2022   Influenza Split 09/27/2011, 08/04/2012   Influenza Whole 10/02/2007, 08/16/2008, 07/30/2010   Influenza, High Dose Seasonal PF 08/26/2013, 07/13/2020, 08/27/2021, 08/31/2022, 07/19/2023   Influenza,inj,Quad PF,6+ Mos 10/03/2014, 10/09/2015, 10/10/2016, 09/26/2017, 09/24/2018   Moderna Covid-19 Fall Seasonal Vaccine 52yrs & older 08/31/2022, 07/29/2023   PFIZER Comirnaty(Gray Top)Covid-19 Tri-Sucrose Vaccine 03/22/2021   PFIZER(Purple Top)SARS-COV-2 Vaccination 12/02/2019, 12/23/2019, 08/18/2020, 03/22/2021   Pfizer Covid-19  Vaccine Bivalent Booster 52yrs & up 08/27/2021   Pneumococcal Conjugate-13 10/03/2014   Pneumococcal Polysaccharide-23 03/31/2008, 06/22/2017   Respiratory Syncytial Virus Vaccine,Recomb Aduvanted(Arexvy) 07/25/2023   Td 10/02/2007, 10/10/2017   Zoster, Live 08/14/2010    Family History  Problem Relation Age of Onset   Cancer Mother        lung   Alcohol abuse Mother    Diabetes Father    Alcohol abuse Father    Cancer Maternal Grandmother        colon   Cancer Maternal Grandfather        colon   Diabetes Paternal Grandmother      Current Outpatient Medications:    acetaminophen (TYLENOL) 500 MG tablet, Take 1,000 mg by mouth as needed., Disp: , Rfl:    albuterol (VENTOLIN HFA) 108 (90 Base) MCG/ACT inhaler, INHALE 2 PUFFS BY MOUTH EVERY 6 HOURS AS NEEDED FOR WHEEZING AND FOR SHORTNESS OF BREATH, Disp: 9 g, Rfl: 2   aspirin EC 81 MG tablet, Take 81 mg by mouth daily. Swallow whole., Disp: , Rfl:    Calcium Carbonate-Vitamin D 600-400 MG-UNIT tablet, Take 1 tablet by mouth daily., Disp: , Rfl:    cetirizine (ZYRTEC) 10 MG tablet, Take 10 mg by mouth daily as needed for allergies., Disp: , Rfl:    Dextromethorphan-Guaifenesin 20-400 MG TABS, Take by mouth 4 (four) times daily as needed., Disp: , Rfl:    DOCOSAHEXAENOIC ACID PO, Take by mouth., Disp: ,  Rfl:    Esomeprazole Magnesium (NEXIUM PO), Take 20 mg by mouth daily., Disp: , Rfl:    ezetimibe (ZETIA) 10 MG tablet, Take 1 tablet by mouth once daily, Disp: 90 tablet, Rfl: 3   fenofibrate (TRICOR) 145 MG tablet, Take 1 tablet (145 mg total) by mouth daily., Disp: 90 tablet, Rfl: 3   Fluticasone-Umeclidin-Vilant (TRELEGY ELLIPTA) 200-62.5-25 MCG/ACT AEPB, INHALE 1 PUFF ONCE DAILY IN  THE  AFTERNOON, Disp: 60 each, Rfl: 3   Glucosamine-Chondroitin-MSM 500-400-300 MG TABS, Take 1 tablet by mouth 2 (two) times daily., Disp: , Rfl:    latanoprost (XALATAN) 0.005 % ophthalmic solution, Place 1 drop into the left eye at bedtime. , Disp: , Rfl:    Lidocaine HCl (LIDAFLEX) 4 % PTCH, Apply 1 patch topically in the morning., Disp: , Rfl:    Melatonin 5 MG TABS, Take 20-40 mg by mouth at bedtime. , Disp: , Rfl:    montelukast (SINGULAIR) 10 MG tablet, TAKE 1 TABLET BY MOUTH AT BEDTIME, Disp: 90 tablet, Rfl: 3   Multiple Vitamins-Minerals (MULTIVITAMIN WITH MINERALS) tablet, Take 1 tablet by mouth daily., Disp: , Rfl:    OVER THE COUNTER MEDICATION, CBD- sublingual for sleep every night and topically as needed for pain, Disp: , Rfl:    Sennosides-Docusate Sodium 8.6-50 MG CAPS, Take 3 capsules by mouth daily. , Disp: , Rfl:    triamterene-hydrochlorothiazide (MAXZIDE-25) 37.5-25 MG tablet, Take 1 tablet by mouth daily., Disp: 90 tablet, Rfl: 3      Objective:   Vitals:   02/10/24 0954  BP: 118/66  Pulse: 84  SpO2: 98%    Estimated body mass index is 21.64 kg/m as calculated from the following:   Height as of 01/29/24: 5' (1.524 m).   Weight as of 01/29/24: 110 lb 12.8 oz (50.3 kg).  @WEIGHTCHANGE @  There were no vitals filed for this visit.   Physical Exam   General: No distress. Looks well. HAS POLI BRACE O2 at rest: no Cane present: no Sitting in  wheel chair: no Frail: no Obese: no Neuro: Alert and Oriented x 3. GCS 15. Speech normal Psych: Pleasant Resp:  Barrel Chest - no.   Wheeze - no, Crackles - no, No overt respiratory distress CVS: Normal heart sounds. Murmurs - no Ext: Stigmata of Connective Tissue Disease - no HEENT: Normal upper airway. PEERL +. No post nasal drip        Assessment:       ICD-10-CM   1. DOE (dyspnea on exertion)  R06.09 CBC w/Diff    Perennial allergen profile IgE    Pulmonary function test    Perennial allergen profile IgE    CBC w/Diff    2. Seasonal allergies  J30.2 CBC w/Diff    Perennial allergen profile IgE    Pulmonary function test    Perennial allergen profile IgE    CBC w/Diff    3. Post-polio syndrome  G14 CBC w/Diff    Perennial allergen profile IgE    Pulmonary function test    Perennial allergen profile IgE    CBC w/Diff    4. Hiatal hernia  K44.9 CBC w/Diff    Perennial allergen profile IgE    Pulmonary function test    Perennial allergen profile IgE    CBC w/Diff    5. Dizziness  R42 CBC w/Diff    Perennial allergen profile IgE    Pulmonary function test    Perennial allergen profile IgE    CBC w/Diff    6. Coronary artery calcification seen on CAT scan  I25.10          Plan:     Patient Instructions     ICD-10-CM   1. DOE (dyspnea on exertion)  R06.09 CBC w/Diff    Perennial allergen profile IgE    Pulmonary function test    2. Seasonal allergies  J30.2 CBC w/Diff    Perennial allergen profile IgE    Pulmonary function test    3. 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 Hiatal hernia  K44.9 CBC w/Diff    Perennial allergen profile IgE    Pulmonary function test    5. Dizziness  R42 CBC w/Diff    Perennial allergen profile IgE    Pulmonary function test    6. Coronary artery calcification seen on CAT scan  I25.10       You have symptoms of shortness of breath and dizziness might or might not be related to the abdominal CT scan of the chest  On the CT scan of the chest there is extremely mild interstitial lung  abnormalities  (ILA) versus interstitial lung disease (ILD)  -Pulm unction test appears stable between 2023 and 2025'  -Autoimmune serologies are negative except for trace positive ANA which could be a normal finding for age  Plan  - Check CBC with differential and RAST allergy panel -Please talk to primary care physician and consider  - referral to cardiology for coronary artery calcification -Glad you are getting the echocardiogram -Regarding the interstitial lung abnormalities recommend based on shared decision making and expectant approach  -do spirometry and DLCO in 3-4 months -Addendum 11/14/2023: Get rid of all feather pillow and bedding's [will have CMA communicate]  - sleep apnea per Buelah Manis  Follow-up - Return in 3-4 months but after spiro ; 15 min visit   FOLLOWUP Return in about 4 months (around 06/11/2024) for 15 min visit,  with Dr Marchelle Gearing, Face to Face Visit.    SIGNATURE    Dr. Kalman Shan, M.D., F.C.C.P,  Pulmonary and Critical Care Medicine Staff Physician, [facility] Director - Interstitial Lung Disease  Program  Pulmonary Fibrosis [facility] [place], [place], [address]  Pager: [phone], If no answer or between  15:00h - 7:00h: call [phone] Telephone: [phone]  5:26 PM 02/12/2024

## 2024-02-10 ENCOUNTER — Ambulatory Visit (INDEPENDENT_AMBULATORY_CARE_PROVIDER_SITE_OTHER): Payer: Medicare Other | Admitting: Internal Medicine

## 2024-02-10 ENCOUNTER — Encounter: Payer: Self-pay | Admitting: Internal Medicine

## 2024-02-10 VITALS — BP 118/66 | HR 84

## 2024-02-10 DIAGNOSIS — I2510 Atherosclerotic heart disease of native coronary artery without angina pectoris: Secondary | ICD-10-CM

## 2024-02-10 DIAGNOSIS — Z87891 Personal history of nicotine dependence: Secondary | ICD-10-CM

## 2024-02-10 DIAGNOSIS — G14 Postpolio syndrome: Secondary | ICD-10-CM

## 2024-02-10 DIAGNOSIS — R42 Dizziness and giddiness: Secondary | ICD-10-CM | POA: Diagnosis not present

## 2024-02-10 DIAGNOSIS — K449 Diaphragmatic hernia without obstruction or gangrene: Secondary | ICD-10-CM

## 2024-02-10 DIAGNOSIS — R0609 Other forms of dyspnea: Secondary | ICD-10-CM | POA: Diagnosis not present

## 2024-02-10 DIAGNOSIS — J302 Other seasonal allergic rhinitis: Secondary | ICD-10-CM

## 2024-02-10 LAB — CBC WITH DIFFERENTIAL/PLATELET
Basophils Absolute: 0.1 10*3/uL (ref 0.0–0.1)
Basophils Relative: 1.9 % (ref 0.0–3.0)
Eosinophils Absolute: 0 10*3/uL (ref 0.0–0.7)
Eosinophils Relative: 1 % (ref 0.0–5.0)
HCT: 40.5 % (ref 36.0–46.0)
Hemoglobin: 14 g/dL (ref 12.0–15.0)
Lymphocytes Relative: 27.1 % (ref 12.0–46.0)
Lymphs Abs: 1.3 10*3/uL (ref 0.7–4.0)
MCHC: 34.5 g/dL (ref 30.0–36.0)
MCV: 99.4 fl (ref 78.0–100.0)
Monocytes Absolute: 0.5 10*3/uL (ref 0.1–1.0)
Monocytes Relative: 9.2 % (ref 3.0–12.0)
Neutro Abs: 3 10*3/uL (ref 1.4–7.7)
Neutrophils Relative %: 60.8 % (ref 43.0–77.0)
Platelets: 303 10*3/uL (ref 150.0–400.0)
RBC: 4.07 Mil/uL (ref 3.87–5.11)
RDW: 13 % (ref 11.5–15.5)
WBC: 4.9 10*3/uL (ref 4.0–10.5)

## 2024-02-10 NOTE — Patient Instructions (Addendum)
 ICD-10-CM   1. DOE (dyspnea on exertion)  R06.09 CBC w/Diff    Perennial allergen profile IgE    Pulmonary function test    2. Seasonal allergies  J30.2 CBC w/Diff    Perennial allergen profile IgE    Pulmonary function test    3. 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 Hiatal hernia  K44.9 CBC w/Diff    Perennial allergen profile IgE    Pulmonary function test    5. Dizziness  R42 CBC w/Diff    Perennial allergen profile IgE    Pulmonary function test    6. Coronary artery calcification seen on CAT scan  I25.10       You have symptoms of shortness of breath and dizziness might or might not be related to the abdominal CT scan of the chest  On the CT scan of the chest there is extremely mild interstitial lung abnormalities versus interstitial lung disease  -Pulm unction test appears stable between 2023 and 2025'  -Autoimmune serologies are negative except for trace positive ANA which could be a normal finding for age  Plan  - Check CBC with differential and RAST allergy panel -Please talk to primary care physician and consider  - referral to cardiology for coronary artery calcification -Glad you are getting the echocardiogram -Regarding the interstitial lung abnormalities recommend based on shared decision making and expectant approach  -2 spirometry and DLCO in 3-4 months  - sleep apnea per Buelah Manis  Follow-up - Return in 3-4 months but after spiro ; 15 min visit

## 2024-02-17 ENCOUNTER — Institutional Professional Consult (permissible substitution): Payer: Medicare Other | Admitting: Internal Medicine

## 2024-02-18 LAB — ALLERGEN PROFILE, PERENNIAL ALLERGEN IGE
Alternaria Alternata IgE: <0.1 kU/L
Aspergillus Fumigatus IgE: <0.1 kU/L
Aureobasidi Pullulans IgE: <0.1 kU/L
Candida Albicans IgE: <0.1 kU/L
Cat Dander IgE: 0.16 kU/L — AB
Chicken Feathers IgE: <0.1 kU/L
Cladosporium Herbarum IgE: <0.1 kU/L
Cow Dander IgE: <0.1 kU/L
D Farinae IgE: 0.45 kU/L — AB
D Pteronyssinus IgE: 0.37 kU/L — AB
Dog Dander IgE: 0.12 kU/L — AB
Duck Feathers IgE: <0.1 kU/L
Goose Feathers IgE: <0.1 kU/L
Mouse Urine IgE: <0.1 kU/L
Mucor Racemosus IgE: <0.1 kU/L
Penicillium Chrysogen IgE: <0.1 kU/L
Phoma Betae IgE: <0.1 kU/L
Setomelanomma Rostrat: <0.1 kU/L
Stemphylium Herbarum IgE: <0.1 kU/L

## 2024-02-24 ENCOUNTER — Ambulatory Visit: Attending: Cardiology

## 2024-02-24 DIAGNOSIS — E782 Mixed hyperlipidemia: Secondary | ICD-10-CM | POA: Diagnosis not present

## 2024-02-24 DIAGNOSIS — I2510 Atherosclerotic heart disease of native coronary artery without angina pectoris: Secondary | ICD-10-CM | POA: Diagnosis not present

## 2024-02-24 DIAGNOSIS — I10 Essential (primary) hypertension: Secondary | ICD-10-CM | POA: Diagnosis not present

## 2024-02-25 DIAGNOSIS — H401121 Primary open-angle glaucoma, left eye, mild stage: Secondary | ICD-10-CM | POA: Diagnosis not present

## 2024-02-25 LAB — ECHOCARDIOGRAM COMPLETE: S' Lateral: 2.7 cm

## 2024-02-27 IMAGING — CR DG CHEST 2V
2 series · 2 of 2 positions shown · non-contrast
Comparison: Chest radiograph dated 06/27/2017.

CLINICAL DATA: Shortness of breath.

EXAM:
CHEST - 2 VIEW

[chest pa]
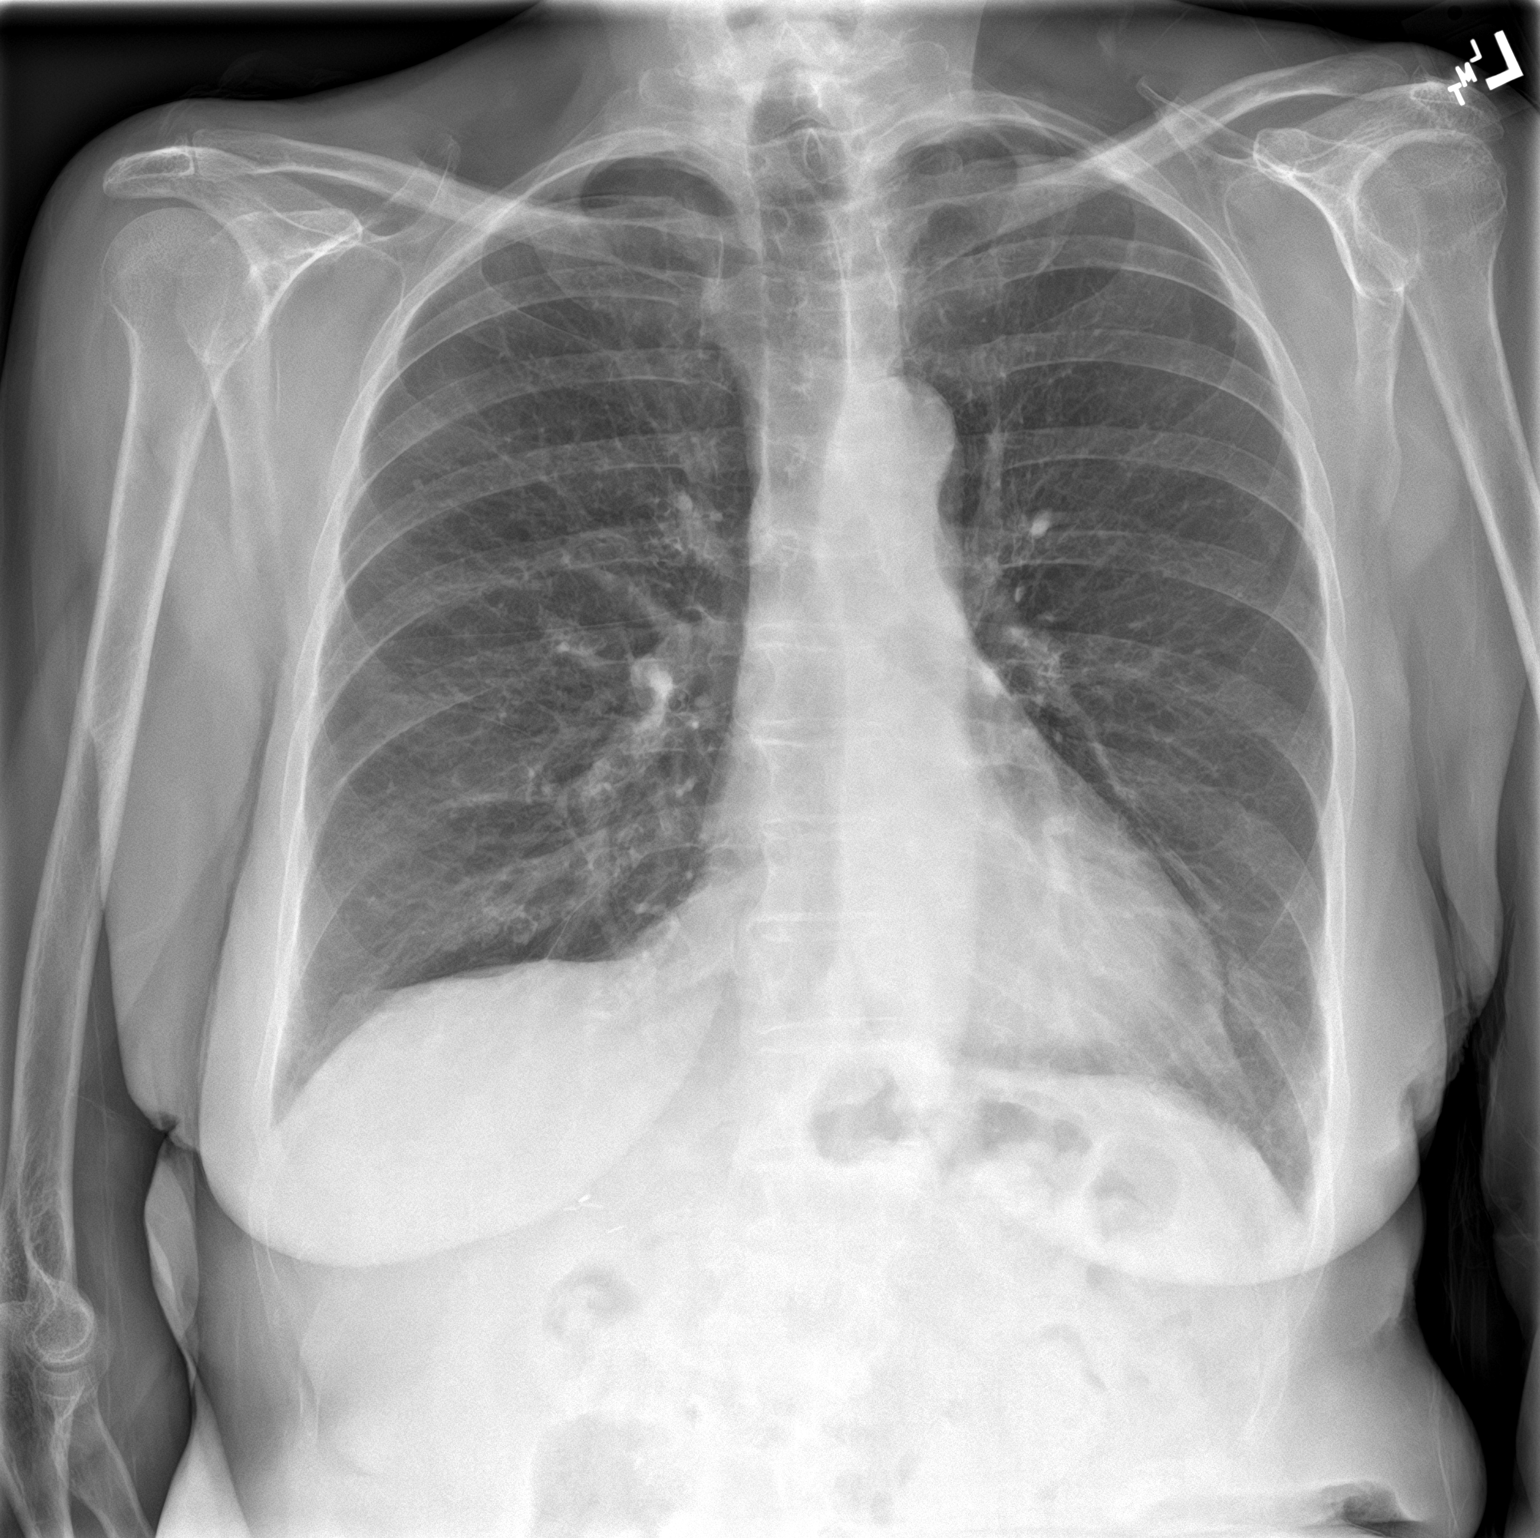

[chest lat]
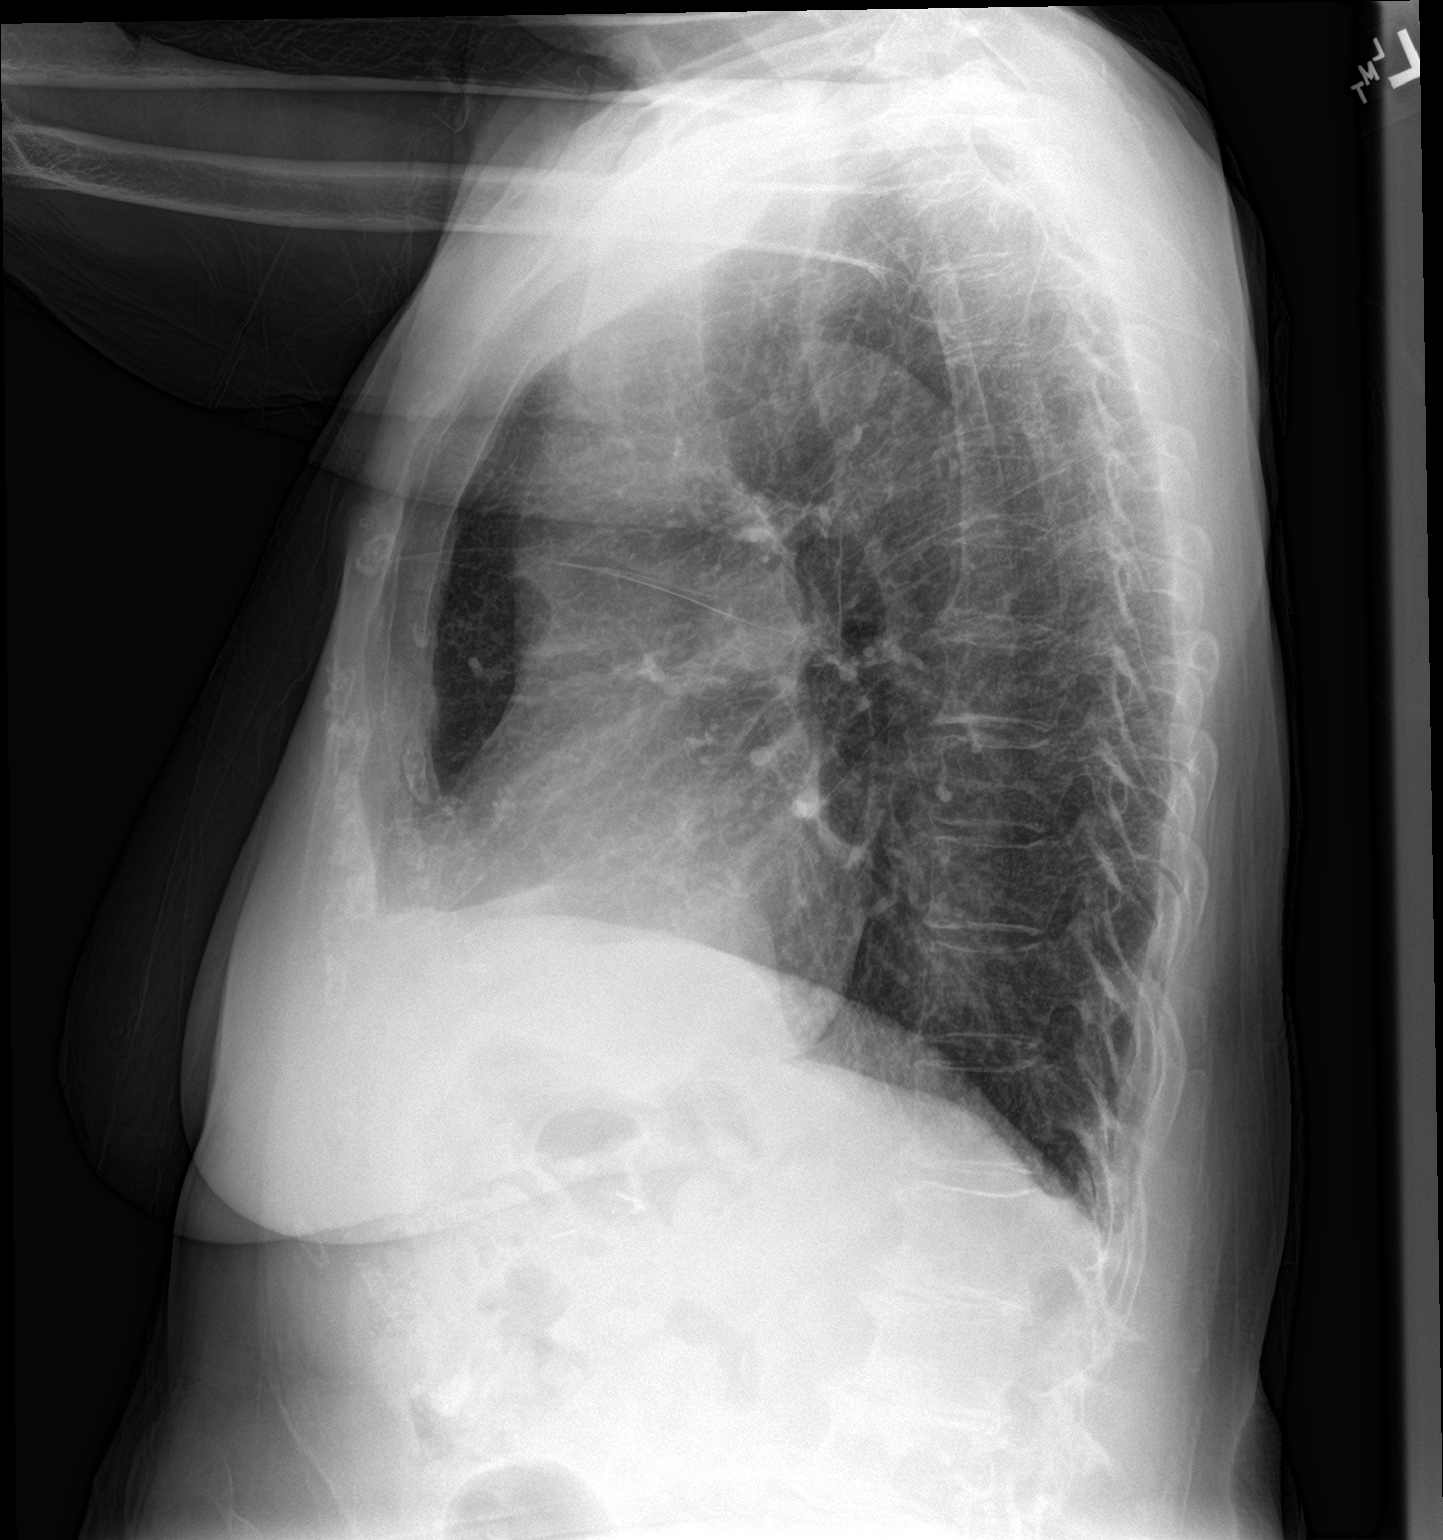

[2 of 2 positions shown; findings below may reference images not displayed]

FINDINGS: There is mild diffuse chronic interstitial coarsening and bronchitic
changes. Minimal left lung base atelectasis. No focal consolidation,
pleural effusion, or pneumothorax. The cardiac silhouette is within
normal limits. Osteopenia with degenerative changes of the spine
dated osseous pathology.
IMPRESSION: No active cardiopulmonary disease.

## 2024-03-29 DIAGNOSIS — H40001 Preglaucoma, unspecified, right eye: Secondary | ICD-10-CM | POA: Diagnosis not present

## 2024-03-29 DIAGNOSIS — H401121 Primary open-angle glaucoma, left eye, mild stage: Secondary | ICD-10-CM | POA: Diagnosis not present

## 2024-03-29 DIAGNOSIS — Z961 Presence of intraocular lens: Secondary | ICD-10-CM | POA: Diagnosis not present

## 2024-04-02 ENCOUNTER — Telehealth: Payer: Self-pay

## 2024-04-02 NOTE — Telephone Encounter (Signed)
 CPAP compliance does not necessitate a stat apt but I am ok with double booking on a day that I am not already

## 2024-04-02 NOTE — Telephone Encounter (Signed)
 Copied from CRM [id number]. Topic: Appointments - Appointment Scheduling >> Apr 02, 2024  8:05 AM Juliana Ocean wrote: Patient needs appointment asap w/ Eulas Hick. Mid am appt, or whatever soonest is.  She said she needs to keep in compliance w/ her cpap regulations. [phone] >> Apr 02, 2024  8:15 AM Harriette Limerick wrote: Can I put patient in blocked slot? Nothing else available for awhile.   Pt can have a Mychart video visit for CPAP compliance or if okay with Irby Mannan, NP the pt can be double booked on a day Jerlene Moody is not already double booked. A compliance check does not have to be in person unless the pt wants it to be.

## 2024-04-02 NOTE — Telephone Encounter (Signed)
 Patient has been scheduled

## 2024-04-02 NOTE — Telephone Encounter (Signed)
 Routing to the front office for scheduling

## 2024-04-13 ENCOUNTER — Encounter: Payer: Self-pay | Admitting: Cardiology

## 2024-04-13 ENCOUNTER — Telehealth: Payer: Self-pay | Admitting: Cardiology

## 2024-04-13 ENCOUNTER — Ambulatory Visit: Attending: Cardiology | Admitting: Cardiology

## 2024-04-13 VITALS — BP 119/62 | HR 75 | Ht 60.0 in | Wt 104.0 lb

## 2024-04-13 DIAGNOSIS — E782 Mixed hyperlipidemia: Secondary | ICD-10-CM

## 2024-04-13 DIAGNOSIS — I10 Essential (primary) hypertension: Secondary | ICD-10-CM | POA: Diagnosis not present

## 2024-04-13 DIAGNOSIS — I2510 Atherosclerotic heart disease of native coronary artery without angina pectoris: Secondary | ICD-10-CM | POA: Diagnosis not present

## 2024-04-13 NOTE — Telephone Encounter (Signed)
 Spoke with patient and she wants to go ahead and have the labs done here in our office. She felt as if she offended the doctor when she was discussing the labs but did not explain well enough so she wanted to express her apologies to him. So in summary she would like to go ahead and have her lipid panel done here in our office. Reviewed our lab hours and advised that I would review with provider first then place order if he is agreeable. She verbalized understanding with no further questions at this time.

## 2024-04-13 NOTE — Patient Instructions (Signed)

## 2024-04-13 NOTE — Telephone Encounter (Signed)
 Patient is calling because at the time of her appointment she was unsure if she should have the labs completed. Patient stated she would like to complete the needed labs and requested we place the lab order. Please advise.

## 2024-04-13 NOTE — Progress Notes (Signed)
 Cardiology Office Note:    Date:  04/13/2024   ID:  Courtney Chandler, DOB May 16, 1941, MRN [id number]  PCP:  Judithann Novas, MD   [facility] Providers Cardiologist:  Constancia Delton, MD     Referring MD: Judithann Novas, MD   Chief Complaint  Patient presents with   Follow-up    2/3 month follow up after echo , feels a little dizzy today , medication reviewed verbally with patient     History of Present Illness:    Courtney Chandler is a 83 y.o. female with a hx of CAD (LAD, RCA, LCx calcifications on chest CT), hypertension, GERD, ILD, postpolio syndrome (initial polio diagnosis at age 55) who presents for follow-up.  Previously seen due to coronary calcifications and elevated calcium score.  Echocardiogram was obtained to evaluate any significant structural abnormalities.  She denies chest pain.  Tolerating Zetia , fenofibrate  as prescribed.  Prior notes/testing Coronary calcium scan 12/24 calcium score 2894, 98 percentile. Patient has a history of pancreatitis with statins.  Past Medical History:  Diagnosis Date   Allergy    Anxiety    associated with dyphagia   Arthritis    osteo of left hip   Asthma    moderate persistant   Complication of anesthesia    extended anesthesia effect because of post polio syndrome   Deviated septum    2002   Hypertension    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   Osteophyte of cervical spine    pushing on esoghagus causing dysphagia   Post-polio syndrome    Scarlet fever    Sleep apnea    severe - CPAP   Wears dentures    lower partial    Past Surgical History:  Procedure Laterality Date   APPENDECTOMY     CATARACT EXTRACTION W/PHACO Left 02/07/2016   Procedure: CATARACT EXTRACTION PHACO AND INTRAOCULAR LENS PLACEMENT (IOC);  Surgeon: Annell Kidney, MD;  Location: [facility];  Service: Ophthalmology;  Laterality: Left;  CPAP   CATARACT EXTRACTION W/PHACO Right 03/06/2016   Procedure: CATARACT  EXTRACTION PHACO AND INTRAOCULAR LENS PLACEMENT (IOC);  Surgeon: Annell Kidney, MD;  Location: [facility];  Service: Ophthalmology;  Laterality: Right;  CPAP   CHOLECYSTECTOMY     COLONOSCOPY WITH ESOPHAGOGASTRODUODENOSCOPY (EGD)     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   albuterol  (VENTOLIN  HFA) 108 (90 Base) MCG/ACT inhaler INHALE 2 PUFFS BY MOUTH EVERY 6 HOURS AS NEEDED FOR WHEEZING AND FOR SHORTNESS OF BREATH   aspirin EC 81 MG tablet Take 81 mg by mouth daily. Swallow whole.   Calcium Carbonate-Vitamin D  600-400 MG-UNIT tablet Take 1 tablet by mouth daily.   cetirizine (ZYRTEC) 10 MG tablet Take 10 mg by mouth daily as needed for allergies.   Dextromethorphan-Guaifenesin 20-400 MG TABS Take by mouth 4 (four) times daily as needed.   DOCOSAHEXAENOIC ACID PO Take by mouth.   Esomeprazole Magnesium  (NEXIUM PO) Take 20 mg by mouth daily.   ezetimibe  (ZETIA ) 10 MG tablet Take 1 tablet by mouth once daily   fenofibrate  (TRICOR ) 145 MG tablet Take 1 tablet (145 mg total) by mouth daily.   Fluticasone -Umeclidin-Vilant (TRELEGY ELLIPTA ) 200-62.5-25 MCG/ACT AEPB INHALE 1 PUFF ONCE DAILY IN  THE  AFTERNOON  Glucosamine-Chondroitin-MSM 500-400-300 MG TABS Take 1 tablet by mouth 2 (two) times daily.   latanoprost  (XALATAN ) 0.005 % ophthalmic solution Place 1 drop into the left eye at bedtime.    Lidocaine  HCl (LIDAFLEX) 4 % PTCH Apply 1 patch topically in the morning.   Melatonin 5 MG TABS Take 20-40 mg by mouth at bedtime.    montelukast  (SINGULAIR ) 10 MG tablet TAKE 1 TABLET BY MOUTH AT BEDTIME   Multiple Vitamins-Minerals (MULTIVITAMIN WITH MINERALS) tablet Take 1 tablet by mouth daily.   OVER THE COUNTER MEDICATION CBD- sublingual for sleep every night and topically as needed for pain   Sennosides-Docusate Sodium 8.6-50 MG CAPS Take  3 capsules by mouth daily.    triamterene -hydrochlorothiazide  (MAXZIDE-25) 37.5-25 MG tablet Take 1 tablet by mouth daily.     Allergies:   Statins, Beta adrenergic blockers, Codeine, and Erythromycin   Social History   Socioeconomic History   Marital status: Single    Spouse name: Not on file   Number of children: Not on file   Years of education: Not on file   Highest education level: Associate degree: academic program  Occupational History   Occupation: disabled    Comment: retired Charity fundraiser  Tobacco Use   Smoking status: Former    Current packs/day: 0.00    Average packs/day: 0.5 packs/day for 8.0 years (4.0 ttl pk-yrs)    Types: Cigarettes    Start date: 1    Quit date: 1980    Years since quitting: 45.4   Smokeless tobacco: Never   Tobacco comments:    quit 1981  Vaping Use   Vaping status: Never Used  Substance and Sexual Activity   Alcohol use: Yes    Comment: very rare wine (2x/yr)   Drug use: No   Sexual activity: Not on file  Other Topics Concern   Not on file  Social History Narrative   Exercise -Lifting weights with arms, swimming       Diet healthy      End of Life: has completed MOST.. Wants to be DNR. (reviewed 2014)   Living will in place.   Social Drivers of Corporate investment banker Strain: Low Risk  (10/29/2023)   Overall Financial Resource Strain (CARDIA)    Difficulty of Paying Living Expenses: Not very hard  Food Insecurity: No Food Insecurity (10/29/2023)   Hunger Vital Sign    Worried About Running Out of Food in the Last Year: Never true    Ran Out of Food in the Last Year: Never true  Transportation Needs: No Transportation Needs (10/29/2023)   PRAPARE - Administrator, Civil Service (Medical): No    Lack of Transportation (Non-Medical): No  Physical Activity: Insufficiently Active (10/29/2023)   Exercise Vital Sign    Days of Exercise per Week: 4 days    Minutes of Exercise per Session: 30 min  Stress: Stress Concern  Present (10/29/2023)   [employer] of Occupational Health - Occupational Stress Questionnaire    Feeling of Stress : To some extent  Social Connections: Socially Isolated (10/29/2023)   Social Connection and Isolation Panel [NHANES]    Frequency of Communication with Friends and Family: Three times a week    Frequency of Social Gatherings with Friends and Family: Once a week    Attends Religious Services: Never    Database administrator or Organizations: No    Attends Engineer, structural: Not on file    Marital Status: Never married  Family History: The patient's family history includes Alcohol abuse in her father and mother; Cancer in her maternal grandfather, maternal grandmother, and mother; Diabetes in her father and paternal grandmother.  ROS:   Please see the history of present illness.     All other systems reviewed and are negative.  EKGs/Labs/Other Studies Reviewed:    The following studies were reviewed today:  EKG Interpretation Date/Time:  Tuesday April 13 2024 10:42:00 EDT Ventricular Rate:  75 PR Interval:  146 QRS Duration:  88 QT Interval:  358 QTC Calculation: 399 R Axis:   10  Text Interpretation: Normal sinus rhythm Nonspecific T wave abnormality Confirmed by Constancia Delton ([id number]) on 04/13/2024 11:24:58 AM    Recent Labs: 11/26/2023: Magnesium  2.0 12/10/2023: ALT 13 01/28/2024: BUN 16; Creatinine, Ser 0.72; Potassium 3.2; Sodium 137 02/10/2024: Hemoglobin 14.0; Platelets 303.0  Recent Lipid Panel    Component Value Date/Time   CHOL 198 10/30/2023 1158   TRIG 298.0 (H) 10/30/2023 1158   HDL 80.50 10/30/2023 1158   CHOLHDL 2 10/30/2023 1158   VLDL 59.6 (H) 10/30/2023 1158   LDLCALC 58 10/30/2023 1158   LDLDIRECT 99.0 10/21/2019 0832     Risk Assessment/Calculations:             Physical Exam:    VS:  BP 119/62 (BP Location: Left Arm, Patient Position: Sitting, Cuff Size: Normal)   Pulse 75   Ht 5' (1.524 m)   Wt 104 lb  (47.2 kg)   SpO2 95%   BMI 20.31 kg/m     Wt Readings from Last 3 Encounters:  04/13/24 104 lb (47.2 kg)  01/29/24 110 lb 12.8 oz (50.3 kg)  01/28/24 111 lb 6.4 oz (50.5 kg)     GEN:  Well nourished, well developed in no acute distress HEENT: Normal NECK: No JVD; No carotid bruits CARDIAC: RRR, no murmurs, rubs, gallops RESPIRATORY:  Clear to auscultation without rales, wheezing or rhonchi  ABDOMEN: Soft, non-tender, non-distended MUSCULOSKELETAL:  No edema; No deformity  SKIN: Warm and dry NEUROLOGIC:  Alert and oriented x 3 PSYCHIATRIC:  Normal affect   ASSESSMENT:    1. Coronary artery disease involving native coronary artery of native heart, unspecified whether angina present   2. Primary hypertension   3. Mixed hyperlipidemia    PLAN:    In order of problems listed above:  CAD, three-vessel coronary calcifications, calcium score 2894, 98 percentile.  Echo 4/25 EF 50 to 55%.  Denies chest pain.  Continue aspirin 81 mg daily, Zetia  10 mg daily. Plan ischemic workup if patient becomes symptomatic. Hypertension, BP controlled.  Continue triamterene -HCTZ 37.5-25 mg daily. Hyperlipidemia, continue Zetia , fenofibrate .  Plans to obtain lipid panel with PCP.  History of pancreatitis with statins.   Follow-up in 6 to 12 months.      Medication Adjustments/Labs and Tests Ordered: Current medicines are reviewed at length with the patient today.  Concerns regarding medicines are outlined above.  Orders Placed This Encounter  Procedures   EKG 12-Lead   No orders of the defined types were placed in this encounter.   Patient Instructions  Medication Instructions:  Your Physician recommend you continue on your current medication as directed.    *If you need a refill on your cardiac medications before your next appointment, please call your pharmacy*  Lab Work: No labs ordered today  If you have labs (blood work) drawn today and your tests are completely normal, you will  receive your results only by: MyChart Message (if  you have MyChart) OR A paper copy in the mail If you have any lab test that is abnormal or we need to change your treatment, we will call you to review the results.  Testing/Procedures: No test ordered today   Follow-Up: At [facility], you and your health needs are our priority.  As part of our continuing mission to provide you with exceptional heart care, our providers are all part of one team.  This team includes your primary Cardiologist (physician) and Advanced Practice Providers or APPs (Physician Assistants and Nurse Practitioners) who all work together to provide you with the care you need, when you need it.  Your next appointment:   1 year(s)  Provider:   You may see Constancia Delton, MD or one of the following Advanced Practice Providers on your designated Care Team:   Laneta Pintos, NP Gildardo Labrador, PA-C Varney Gentleman, PA-C Cadence Concord, PA-C Ronald Cockayne, NP Morey Ar, NP    We recommend signing up for the patient portal called "MyChart".  Sign up information is provided on this After Visit Summary.  MyChart is used to connect with patients for Virtual Visits (Telemedicine).  Patients are able to view lab/test results, encounter notes, upcoming appointments, etc.  Non-urgent messages can be sent to your provider as well.   To learn more about what you can do with MyChart, go to [email or web address].      Signed, Constancia Delton, MD  04/13/2024 12:35 PM    [facility]

## 2024-04-14 NOTE — Telephone Encounter (Signed)
 Called patient to advise of providers note - verbalized understanding and agreement with plan - lab order entered

## 2024-04-19 ENCOUNTER — Telehealth: Payer: Self-pay

## 2024-04-19 NOTE — Telephone Encounter (Signed)
   Pre-operative Risk Assessment    Patient Name: Courtney Chandler  DOB: 20-Nov-1940 MRN: [id number]   Date of last office visit: 04/13/24 with Dr. Junnie Olives Date of next office visit: none  Request for Surgical Clearance    Procedure:  Fillings and Crowns  Date of Surgery:  Clearance TBD                                 Surgeon:  Dr. [place] Thomas Surgeon's Group or Practice Name:  [facility]  Phone number:  [phone] Fax number:  [phone]   Type of Clearance Requested:   - Medical  - Pharmacy:  Hold Aspirin not indicated   Type of Anesthesia:  Local with epinephrine    Additional requests/questions:    Kenny Peals   04/19/2024, 3:50 PM

## 2024-04-19 NOTE — Telephone Encounter (Signed)
    Primary Cardiologist: Constancia Delton, MD  Chart reviewed as part of pre-operative protocol coverage. Simple dental extractions, fillings, crowns are considered low risk procedures per guidelines and generally do not require any specific cardiac clearance. It is also generally accepted that for simple extractions, fillings, crowns, and dental cleanings, there is no need to interrupt blood thinner therapy or antiplatelet therapy.   SBE prophylaxis is not required for the patient.  I will route this recommendation to the requesting party via Epic fax function and remove from pre-op pool.  Please call with questions.  Ava Boatman, NP 04/19/2024, 4:55 PM

## 2024-05-16 ENCOUNTER — Ambulatory Visit: Payer: Self-pay | Admitting: Internal Medicine

## 2024-05-16 NOTE — Telephone Encounter (Signed)
  Significant dust mite, cat and dog allergy  Plan - if she has cat/dog -> need to talk to vet to reduce allergy load  - carept cleaning needs to be done  - for house dust mite -  she should google for informatiion on reducing dust mite allergy load   xxxxxxxxxx   Latest Reference Range & Units 02/10/24 10:44  WBC 4.0 - 10.5 K/uL 4.9  RBC 3.87 - 5.11 Mil/uL 4.07  Hemoglobin 12.0 - 15.0 g/dL 85.9  HCT 63.9 - 53.9 % 40.5  MCV 78.0 - 100.0 fl 99.4  MCHC 30.0 - 36.0 g/dL 65.4  RDW 88.4 - 84.4 % 13.0  Platelets 150.0 - 400.0 K/uL 303.0  Neutrophils 43.0 - 77.0 % 60.8  Lymphocytes 12.0 - 46.0 % 27.1  Monocytes Relative 3.0 - 12.0 % 9.2  Eosinophil 0.0 - 5.0 % 1.0  Basophil 0.0 - 3.0 % 1.9  NEUT# 1.4 - 7.7 K/uL 3.0  Lymphs Abs 0.7 - 4.0 K/uL 1.3  Monocyte # 0.1 - 1.0 K/uL 0.5  Eosinophils Absolute 0.0 - 0.7 K/uL 0.0  Basophils Absolute 0.0 - 0.1 K/uL 0.1  Class Description Allergens  Comment  D Pteronyssinus IgE Class I kU/L 0.37 !  D Farinae IgE Class I kU/L 0.45 !  Cat Dander IgE Class 0/I kU/L 0.16 !  Dog Dander IgE Class 0/I kU/L 0.12 !  Penicillium Chrysogen IgE Class 0 kU/L <0.10  Cladosporium Herbarum IgE Class 0 kU/L <0.10  Aspergillus Fumigatus IgE Class 0 kU/L <0.10  Mucor Racemosus IgE Class 0 kU/L <0.10  Alternaria Alternata IgE Class 0 kU/L <0.10  Stemphylium Herbarum IgE Class 0 kU/L <0.10  Goose Feathers IgE Class 0 kU/L <0.10  Chicken Feathers IgE Class 0 kU/L <0.10  Duck Feathers IgE Class 0 kU/L <0.10  Mouse Urine IgE Class 0 kU/L <0.10  !: Data is abnormal

## 2024-05-24 ENCOUNTER — Other Ambulatory Visit: Payer: Self-pay | Admitting: Family Medicine

## 2024-06-10 ENCOUNTER — Ambulatory Visit (INDEPENDENT_AMBULATORY_CARE_PROVIDER_SITE_OTHER): Admitting: Internal Medicine

## 2024-06-10 ENCOUNTER — Encounter: Payer: Self-pay | Admitting: Internal Medicine

## 2024-06-10 VITALS — BP 126/52 | HR 74 | Temp 97.9°F | Ht 60.0 in | Wt 102.0 lb

## 2024-06-10 DIAGNOSIS — J4540 Moderate persistent asthma, uncomplicated: Secondary | ICD-10-CM

## 2024-06-10 DIAGNOSIS — Z9109 Other allergy status, other than to drugs and biological substances: Secondary | ICD-10-CM | POA: Diagnosis not present

## 2024-06-10 DIAGNOSIS — R918 Other nonspecific abnormal finding of lung field: Secondary | ICD-10-CM | POA: Diagnosis not present

## 2024-06-10 DIAGNOSIS — G4733 Obstructive sleep apnea (adult) (pediatric): Secondary | ICD-10-CM | POA: Diagnosis not present

## 2024-06-10 DIAGNOSIS — J3081 Allergic rhinitis due to animal (cat) (dog) hair and dander: Secondary | ICD-10-CM | POA: Diagnosis not present

## 2024-06-10 DIAGNOSIS — J929 Pleural plaque without asbestos: Secondary | ICD-10-CM

## 2024-06-10 NOTE — Progress Notes (Signed)
 OV 02/12/2024  Subjective:  Patient ID: Courtney Chandler, female , DOB: 1941-06-16 , age 83 y.o. , MRN: [id number] , ADDRESS: [address] [name] [address] PCP Avelina Greig BRAVO, MD Patient Care Team: Avelina Greig BRAVO, MD as PCP - General Darliss Rogue, MD as PCP - Cardiology (Cardiology) Mittie Gaskin, MD as Referring Physician (Ophthalmology) Judene Macleod, DDS as Referring Physician (Dentistry)  This Provider for this visit: Treatment Team:  Attending Provider: Geronimo Amel, MD    02/12/2024 -   Chief Complaint  Patient presents with   Follow-up    ILD- Former pt of Dr. Shellia. Breathing is unchanged since last visit. She c/o feeling dizzy today.      HPI Courtney Chandler 83 y.o. -is a retired former Engineer, civil (consulting).  She had polio and therefore uses braces.  She used to live in [place]  but then moved to be with her son 18 years ago in 2008.  At least live near her son.  She has grown grandchildren now in the area.  She says she is always had some amount of shortness of breath even in [place]  and periodic episodes of dizziness.  Is been going on for 30 years.  It is on and off and not all the time.  She has been referred for concern of interstitial lung disease.  She lives alone and her son lives nearby.  She has both shortness of breath and cough.  Cough itself started years ago.  Over time its gotten worse.  There are some clear sputum.  She does cough at night.  She does not have any wheezing with the cough no hemoptysis does not get worse when she lies down.  He does affect her voice and she does clear her throat but there is no tickle in the cough.  Of note she has had intermittent dizziness as well along with the shortness of breath for many years.  This is largely unexplained.   [place] Integrated Comprehensive ILD Questionnaire  Symptoms:   Past Medical History :  -She suffered polio at age 43 and then again at age 68.  She wears  braces.  She has some diplopia. - She reports a history of on and off asthma for several decades. - No COPD.  No heart disease no connective tissue disease That she reported yes for polymyositis in the form but did not indicate that to me in the history That she does have esophagitis and small hiatal hernia - No sleep apnea no pulmonary hypertension That she does have coronary artery calcification - She has had COVID-vaccine but not the COVID disease. -She does have sleep apnea and sees Landry Ferrari the nurse practitioner   ROS:  -Positive for fatigue arthralgia some difficulty swallowing fluids.  She also has dry eyes on and off nausea.  She does have some snoring  FAMILY HISTORY of LUNG DISEASE:  -She does have a cousin with autoimmune disease lupus/scleroderma.  Otherwise family history negative for pulmonary disease  PERSONAL EXPOSURE HISTORY:  -Started smoking between 1958 and 1979.  1 to 10 cigarettes/day.  Otherwise no cigarettes no electronic cigarettes no vaping no marijuana currently but in 1980 in 1982 she did do some marijuana.  HOME  EXPOSURE and HOBBY DETAILS :  -[place].  The age of the home is 19 years.  Before that lived in a single-family home.  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  Detail organic and inorganic antigen history exposure is negative  PULMONARY TOXICITY HISTORY (27 items):  Neck but she did take some beta-blockers few years ago and had allergic reaction she is taking hydrochlorothiazide   INVESTIGATIONS: x     CT Chest data from date: *  - personally visualized an independently interpreted : yes - my findings are: ILD versus ILD Narrative & Impression  CLINICAL DATA:   83 year old female with history of chronic cough.   EXAM: CT CHEST WITHOUT CONTRAST   TECHNIQUE: Multidetector CT imaging of the chest was performed following the standard protocol without intravenous contrast. High resolution imaging of the lungs, as well as inspiratory and expiratory imaging, was performed.   RADIATION DOSE REDUCTION: This exam was performed according to the departmental dose-optimization program which includes automated exposure control, adjustment of the mA and/or kV according to patient size and/or use of iterative reconstruction technique.   COMPARISON:  No priors.   FINDINGS: Cardiovascular: Heart size is normal. There is no significant pericardial fluid, thickening or pericardial calcification. There is aortic atherosclerosis, as well as atherosclerosis of the great vessels of the mediastinum and the coronary arteries, including calcified atherosclerotic plaque in the left main, left anterior descending, left circumflex and right coronary arteries.   Mediastinum/Nodes: No pathologically enlarged mediastinal or hilar lymph nodes. Moderate-sized hiatal hernia. No axillary lymphadenopathy.   Lungs/Pleura: Small noncalcified pleural plaques are noted bilaterally (right greater than left). No calcified pleural plaques are noted. High-resolution images demonstrate very mild areas of ground-glass attenuation and septal thickening scattered throughout the lungs bilaterally, most evident in the lung bases. No definite traction bronchiectasis or honeycombing. Inspiratory and expiratory imaging demonstrates some mild air trapping indicative of mild small airways disease. No acute consolidative airspace disease. No pleural effusions. No definite suspicious appearing pulmonary nodules or masses are noted. Scattered calcified granulomas are evident.   Upper Abdomen: Aortic atherosclerosis.  Status post cholecystectomy.   Musculoskeletal: There are no aggressive  appearing lytic or blastic lesions noted in the visualized portions of the skeleton.   IMPRESSION: 1. The appearance of the lungs may suggest early or mild interstitial lung disease, with a spectrum of findings considered indeterminate for usual interstitial pneumonia (UIP) per current ATS guidelines. 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 Repeat high-resolution chest CT is recommended in 12 months to assess for temporal changes in the appearance of the lung parenchyma. 2. Small noncalcified pleural plaques bilaterally, nonspecific. Attention at time of repeat high-resolution chest CT is recommended to ensure stability. 3. Aortic atherosclerosis, in addition to left main and three-vessel coronary artery disease. Please note that although the presence of coronary artery calcium documents the presence of coronary artery disease, the severity of this disease and any potential stenosis cannot be assessed on this non-gated CT examination. Assessment for potential risk factor modification, dietary therapy or pharmacologic therapy may be warranted, if clinically indicated.   Aortic Atherosclerosis (ICD10-I70.0).     Electronically Signed   By: Toribio Aye M.D.   On: 10/22/2023 07:40    OV 06/10/2024  Subjective:  Patient ID: Courtney Chandler, female , DOB: 1940-12-01 , age 32 y.o. , MRN: [id number] , ADDRESS: [address]  [place] [address] PCP Avelina Greig BRAVO, MD Patient Care Team: Avelina Greig BRAVO, MD as PCP - General Darliss Rogue, MD as PCP - Cardiology (Cardiology) Mittie Gaskin, MD as Referring Physician (Ophthalmology) Judene Macleod, DDS as Referring Physician (Dentistry)  This Provider for this visit: Treatment Team:  Attending Provider: Geronimo Amel, MD    06/10/2024 -   Chief Complaint  Patient presents with   Follow-up    79mo f/u Pt has no concerns to address, states Trelegy has  helped so much. Pt has complaints of chest pain in the last week and increased use of albuterol .     HPI Courtney Chandler 83 y.o. -returns for follow-up*,. Several issues  #Sleep apnea: With Dr. Shellia having left the office she after a lot of back-and-forth is established with Almarie Ferrari nurse practitioner and has an appointment later in August 2025  #Interstitial lung disease: Mild and observation therapy.  Symptoms are stable.  She did not have pulmonary function test because of some schedule mixup in our office.  She prefers to have this before she sees Almarie Ferrari.  She has noncalcified pleural plaques ILD was diagnosed on the CT December 2024.  Will get a repeat CT scan in December 2025 or January 2026  Asthma: She is stable on Trelegy started by Dr. Shellia late last year and Singulair  started early this year.  Singulair  apparently was started by primary care according to my CMA.  She is reporting stability but she is having chest burning while walking in the heat from the car to the grocery store it is bilateral in for axillary area.  She is not interested in like a low-dose steroid we agreed and took a shared decision making to watch this.         SYMPTOM SCALE - ILD 02/12/2024  Current weight   O2 use ra  Shortness of Breath 0 -> 5 scale with 5 being worst (score 6 If unable to do)  At rest 2  Simple tasks - showers, clothes change, eating, shaving 2  Household (dishes, doing bed, laundry) 2  Shopping 2  Walking level at own pace 2  Walking up Stairs 2  Total (30-36) Dyspnea Score 12      Non-dyspnea symptoms (0-> 5 scale) 02/12/2024  How bad is your cough? 1  How bad is your fatigue 2  How bad is nausea 0  How bad is vomiting?  1  How bad is diarrhea? 0  How bad is anxiety? 1  How bad is depression 0  Any chronic pain - if so where and how bad 0     PFT     Latest Ref Rng & Units 01/14/2024   11:35 AM 06/20/2022    3:00 PM  PFT Results  FVC-Pre L 2.89   2.90   FVC-Predicted Pre % 138  135   FVC-Post L 2.88  3.04   FVC-Predicted Post % 137  142   Pre FEV1/FVC % % 76  74   Post FEV1/FCV % % 59  74   FEV1-Pre L 2.19  2.15   FEV1-Predicted Pre % 142  136   FEV1-Post L 1.71  2.26   DLCO uncorrected ml/min/mmHg 9.53  10.66   DLCO UNC% % 58  65   DLVA Predicted % 48  52   TLC L 5.94  4.91   TLC % Predicted % 133  110   RV % Predicted % 130  83  LAB RESULTS last 96 hours No results found.       has a past medical history of Allergy, Anxiety, Arthritis, Asthma, Complication of anesthesia, Deviated septum, Hypertension, Leg length inequality, Osteophyte of cervical spine, Post-polio syndrome, Scarlet fever, Sleep apnea, and Wears dentures.   reports that she quit smoking about 45 years ago. Her smoking use included cigarettes. She started smoking about 53 years ago. She has a 4 pack-year smoking history. She has never used smokeless tobacco.  Past Surgical History:  Procedure Laterality Date   APPENDECTOMY     CATARACT EXTRACTION W/PHACO Left 02/07/2016   Procedure: CATARACT EXTRACTION PHACO AND INTRAOCULAR LENS PLACEMENT (IOC);  Surgeon: Dene Etienne, MD;  Location[facility];  Service: Ophthalmology;  Laterality: Left;  CPAP   CATARACT EXTRACTION W/PHACO Right 03/06/2016   Procedure: CATARACT EXTRACTION PHACO AND INTRAOCULAR LENS PLACEMENT (IOC);  Surgeon: Dene Etienne, MD;  Location: [facility];  Service: Ophthalmology;  Laterality: Right;  CPAP   CHOLECYSTECTOMY     COLONOSCOPY WITH ESOPHAGOGASTRODUODENOSCOPY (EGD)     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   NASAL SEPTUM SURGERY      Allergies  Allergen Reactions   Statins      pancreatitis   Beta Adrenergic Blockers     Asthma exacerbation   Codeine     REACTION: Nausea and vomiting   Erythromycin     REACTION: Nausea and vomiting    Immunization History  Administered Date(s) Administered   Fluad Quad(high Dose 65+) 08/05/2019,  07/28/2020, 08/31/2022   Influenza Split 09/27/2011, 08/04/2012   Influenza Whole 10/02/2007, 08/16/2008, 07/30/2010   Influenza, High Dose Seasonal PF 08/26/2013, 07/13/2020, 08/27/2021, 08/31/2022, 07/19/2023   Influenza,inj,Quad PF,6+ Mos 10/03/2014, 10/09/2015, 10/10/2016, 09/26/2017, 09/24/2018   Moderna Covid-19 Fall Seasonal Vaccine 60yrs & older 08/31/2022, 07/29/2023   PFIZER Comirnaty(Gray Top)Covid-19 Tri-Sucrose Vaccine 03/22/2021   PFIZER(Purple Top)SARS-COV-2 Vaccination 12/02/2019, 12/23/2019, 08/18/2020, 03/22/2021   Pfizer Covid-19 Vaccine Bivalent Booster 80yrs & up 08/27/2021   Pneumococcal Conjugate-13 10/03/2014   Pneumococcal Polysaccharide-23 03/31/2008, 06/22/2017   Respiratory Syncytial Virus Vaccine,Recomb Aduvanted(Arexvy) 07/25/2023   Td 10/02/2007, 10/10/2017   Zoster, Live 08/14/2010    Family History  Problem Relation Age of Onset   Cancer Mother        lung   Alcohol abuse Mother    Diabetes Father    Alcohol abuse Father    Cancer Maternal Grandmother        colon   Cancer Maternal Grandfather        colon   Diabetes Paternal Grandmother      Current Outpatient Medications:    acetaminophen  (TYLENOL ) 500 MG tablet, Take 1,000 mg by mouth as needed., Disp: , Rfl:    albuterol  (VENTOLIN  HFA) 108 (90 Base) MCG/ACT inhaler, INHALE 2 PUFFS BY MOUTH EVERY 6 HOURS AS NEEDED FOR WHEEZING AND FOR SHORTNESS OF BREATH, Disp: 9 g, Rfl: 2   aspirin EC 81 MG tablet, Take 81 mg by mouth daily. Swallow whole., Disp: , Rfl:    Calcium Carbonate-Vitamin D  600-400 MG-UNIT tablet, Take 1 tablet by mouth daily., Disp: , Rfl:    cetirizine (ZYRTEC) 10 MG tablet, Take 10 mg by mouth daily as needed for allergies., Disp: , Rfl:    Dextromethorphan-Guaifenesin 20-400 MG TABS, Take by mouth 4 (four) times daily as needed., Disp: , Rfl:    DOCOSAHEXAENOIC ACID PO, Take by mouth., Disp: , Rfl:    Esomeprazole Magnesium  (NEXIUM PO), Take 20 mg by mouth daily.,  Disp: , Rfl:     ezetimibe  (ZETIA ) 10 MG tablet, Take 1 tablet by mouth once daily, Disp: 90 tablet, Rfl: 3   fenofibrate  (TRICOR ) 145 MG tablet, Take 1 tablet (145 mg total) by mouth daily., Disp: 90 tablet, Rfl: 3   Fluticasone -Umeclidin-Vilant (TRELEGY ELLIPTA ) 200-62.5-25 MCG/ACT AEPB, INHALE 1 PUFF ONCE DAILY IN  THE  AFTERNOON, Disp: 60 each, Rfl: 3   Glucosamine-Chondroitin-MSM 500-400-300 MG TABS, Take 1 tablet by mouth 2 (two) times daily., Disp: , Rfl:    latanoprost  (XALATAN ) 0.005 % ophthalmic solution, Place 1 drop into the left eye at bedtime. , Disp: , Rfl:    Lidocaine  HCl (LIDAFLEX) 4 % PTCH, Apply 1 patch topically in the morning., Disp: , Rfl:    Melatonin 5 MG TABS, Take 20-40 mg by mouth at bedtime. , Disp: , Rfl:    montelukast  (SINGULAIR ) 10 MG tablet, TAKE 1 TABLET BY MOUTH AT BEDTIME, Disp: 90 tablet, Rfl: 3   Multiple Vitamins-Minerals (MULTIVITAMIN WITH MINERALS) tablet, Take 1 tablet by mouth daily., Disp: , Rfl:    OVER THE COUNTER MEDICATION, CBD- sublingual for sleep every night and topically as needed for pain, Disp: , Rfl:    Sennosides-Docusate Sodium 8.6-50 MG CAPS, Take 3 capsules by mouth daily. , Disp: , Rfl:    triamterene -hydrochlorothiazide  (MAXZIDE-25) 37.5-25 MG tablet, Take 1 tablet by mouth daily., Disp: 90 tablet, Rfl: 3      Objective:   Vitals:   06/10/24 1110  BP: (!) 126/52  Pulse: 74  Temp: 97.9 F (36.6 C)  TempSrc: Oral  SpO2: 98%  Weight: 102 lb (46.3 kg)  Height: 5' (1.524 m)    Estimated body mass index is 19.92 kg/m as calculated from the following:   Height as of this encounter: 5' (1.524 m).   Weight as of this encounter: 102 lb (46.3 kg).  @WEIGHTCHANGE @  [employer]   06/10/24 1110  Weight: 102 lb (46.3 kg)     Physical Exam   General: No distress. Looks well O2 at rest: no Cane present: no Sitting in wheel chair: no Frail: non Obese: on Neuro: Alert and Oriented x 3. GCS 15. Speech normal Psych: Pleasant Resp:   Barrel Chest - o.  Wheeze - no, Crackles - no, No overt respiratory distress CVS: Normal heart sounds. Murmurs - no Ext: Stigmata of Connective Tissue Disease - no HEENT: Normal upper airway. PEERL +. 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 Interstitial lung abnormality (ILA)  R91.8     2. Pleural plaque  J92.9     3. Moderate persistent asthma without complication  J45.40     4. House dust mite allergy  Z91.09     5. 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 OSA (obstructive sleep apnea)  G47.33        #Interstitial lung abnormalities #Noncalcified pleural plaques seen on CT December 2024  On the CT scan of the chest  DEc 2024there is extremely mild interstitial lung abnormalities  (ILA) versus interstitial lung disease (ILD) On this visit July 2025 you are clinically stable  Plan - Do high-resolution CT chest in 6 months  #Asthma  -  Under control with Trelegy and Singulair  but current heat might be causing your chest burning when you are exerting yourself out of the car to the grocery store - But glad overall symptoms are improved after he moved to a new apartment April 2025 which has less carpeting     Plan  - Continue Trelegy - Continue Singulair  - Continue to control dust mite allergy at home - Continue to limit dog dander exposure to the extent possible  - Get PFT done in August 2025 before you see Landry Ferrari   #Sleep apnea  Plan -  per Landry Ferrari; keep visit with her in August 2025  Follow-up - Return in 6 months Dr. Geronimo after CT chest 15-minute visit    FOLLOWUP    Return in about 6 months (around 12/11/2024) for August 2025 Landry Ferrari but after PFT, & in 6 months 15-minute visit with  Dr. Geronimo after CT.    SIGNATURE    Dr. Dorethia Geronimo, M.D., F.C.C.P,  Pulmonary and Critical Care Medicine Staff Physician, [facility] Director - Interstitial Lung Disease  Program  Pulmonary Fibrosis [facility] at [facility] [place], [place], [address]  Pager: [phone], If no answer or between  15:00h - 7:00h: call [phone] Telephone: [phone]  12:05 PM 06/10/2024

## 2024-06-10 NOTE — Patient Instructions (Addendum)
 ICD-10-CM   1. Interstitial lung abnormality (ILA)  R91.8     2. Pleural plaque  J92.9     3. Moderate persistent asthma without complication  J45.40     4. House dust mite allergy  Z91.09     5. 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 OSA (obstructive sleep apnea)  G47.33        #Interstitial lung abnormalities #Noncalcified pleural plaques seen on CT December 2024  On the CT scan of the chest  DEc 2024there is extremely mild interstitial lung abnormalities  (ILA) versus interstitial lung disease (ILD) On this visit July 2025 you are clinically stable  Plan - Do high-resolution CT chest in 6 months  #Asthma  -Under control with Trelegy and Singulair  but current heat might be causing your chest burning when you are exerting yourself out of the car to the grocery store - But glad overall symptoms are improved after he moved to a new apartment April 2025 which has less carpeting     Plan  - Continue Trelegy - Continue Singulair  - Continue to control dust mite allergy at home - Continue to limit dog dander exposure to the extent possible  - Get PFT done in August 2025 before you see Landry Ferrari   #Sleep apnea  Plan -  per Landry Ferrari; keep visit with her in August 2025  Follow-up - Return in 6 months Dr. Geronimo after CT chest 15-minute visit

## 2024-06-11 ENCOUNTER — Other Ambulatory Visit: Payer: Self-pay

## 2024-06-11 ENCOUNTER — Telehealth: Payer: Self-pay

## 2024-06-11 MED ORDER — TRELEGY ELLIPTA 200-62.5-25 MCG/ACT IN AEPB: 1.0000 | INHALATION_SPRAY | Freq: Every day | RESPIRATORY_TRACT | 3 refills | Status: DC

## 2024-06-11 NOTE — Telephone Encounter (Signed)
 Copied from CRM [id number]. Topic: General - Other >> Jun 11, 2024 10:38 AM Shona S wrote: Reason for CRM: patient is calling because she wanted to let dr geronimo know that she put his name on her prescription because almarie ferrari was not refilling her prescription how she was supposed to.   Spoke with patient  Rx sent   NFN-

## 2024-06-16 ENCOUNTER — Other Ambulatory Visit: Payer: Self-pay

## 2024-06-17 ENCOUNTER — Ambulatory Visit: Admitting: Primary Care

## 2024-06-24 ENCOUNTER — Ambulatory Visit: Admitting: Internal Medicine

## 2024-06-24 DIAGNOSIS — R42 Dizziness and giddiness: Secondary | ICD-10-CM

## 2024-06-24 DIAGNOSIS — R0609 Other forms of dyspnea: Secondary | ICD-10-CM | POA: Diagnosis not present

## 2024-06-24 DIAGNOSIS — J302 Other seasonal allergic rhinitis: Secondary | ICD-10-CM

## 2024-06-24 DIAGNOSIS — G14 Postpolio syndrome: Secondary | ICD-10-CM

## 2024-06-24 DIAGNOSIS — K449 Diaphragmatic hernia without obstruction or gangrene: Secondary | ICD-10-CM

## 2024-06-24 LAB — PULMONARY FUNCTION TEST
DL/VA % pred: 55 %
DL/VA: 2.31 ml/min/mmHg/L
DLCO cor % pred: 62 %
DLCO cor: 10.19 ml/min/mmHg
DLCO unc % pred: 62 %
DLCO unc: 10.19 ml/min/mmHg
FEF 25-75 Pre: 1.74 L/s
FEF2575-%Pred-Pre: 160 %
FEV1-%Pred-Pre: 119 %
FEV1-Pre: 1.79 L
FEV1FVC-%Pred-Pre: 112 %
FEV6-%Pred-Pre: 113 %
FEV6-Pre: 2.18 L
FEV6FVC-%Pred-Pre: 106 %
FVC-%Pred-Pre: 106 %
FVC-Pre: 2.18 L
Pre FEV1/FVC ratio: 82 %
Pre FEV6/FVC Ratio: 100 %

## 2024-06-24 NOTE — Patient Instructions (Signed)
 Spirometry and diffusion capacity performed today.

## 2024-06-24 NOTE — Progress Notes (Signed)
 Spirometry and diffusion capacity performed today.

## 2024-06-29 ENCOUNTER — Encounter: Payer: Self-pay | Admitting: Primary Care

## 2024-06-29 ENCOUNTER — Ambulatory Visit (INDEPENDENT_AMBULATORY_CARE_PROVIDER_SITE_OTHER): Admitting: Primary Care

## 2024-06-29 VITALS — BP 126/64 | HR 76 | Temp 97.8°F | Ht 60.0 in | Wt 105.8 lb

## 2024-06-29 DIAGNOSIS — G4733 Obstructive sleep apnea (adult) (pediatric): Secondary | ICD-10-CM

## 2024-06-29 DIAGNOSIS — J45909 Unspecified asthma, uncomplicated: Secondary | ICD-10-CM | POA: Diagnosis not present

## 2024-06-29 DIAGNOSIS — R918 Other nonspecific abnormal finding of lung field: Secondary | ICD-10-CM | POA: Diagnosis not present

## 2024-06-29 DIAGNOSIS — J4540 Moderate persistent asthma, uncomplicated: Secondary | ICD-10-CM

## 2024-06-29 DIAGNOSIS — Z87891 Personal history of nicotine dependence: Secondary | ICD-10-CM

## 2024-06-29 MED ORDER — TRAZODONE HCL 50 MG PO TABS: 50.0000 mg | ORAL_TABLET | Freq: Every evening | ORAL | 1 refills | Status: DC | PRN

## 2024-06-29 NOTE — Progress Notes (Signed)
 @Patient  ID: Courtney Chandler, female    DOB: 02/20/1941, 83 y.o.   MRN: [id number]  Chief Complaint  Patient presents with   Obstructive Sleep Apnea    CPAP f/u     Referring provider: Avelina Greig BRAVO, MD  HPI: 83 year old female, former smoker.  Past medical history significant for hypertension, asthma, OSA, allergic rhinitis, postpolio syndrome, osteoarthritis, osteopenia, hypercholesterolemia.  Former patient of Dr. Shellia.  PSG 08/07/05 >> RDI 67.9, SpO2 low 61%   06/29/2024- OSA follow-up  Discussed the use of AI scribe software for clinical note transcription with the patient, who gave verbal consent to proceed.  History of Present Illness Courtney Chandler is an 83 year old female who presents for a follow-up regarding her CPAP therapy.  She experiences erratic sleep patterns, describing them as 'absolutely crazy.' She uses melatonin, which initially helped, but her sleep has become inconsistent due to stress and caring for her aging dog, requiring her to wake up two to three times a night. She sometimes struggles to return to sleep after these interruptions. She sometimes forgets to put the CPAP mask on or experiences a sensation of not being able to breathe, which she describes as 'like a wall.' Despite these issues, she occasionally sleeps well, noting a rare instance of sleeping 11 hours in a night. She aims for at least four hours of sleep but finds it challenging at times.  She experiences dizziness, which has improved since starting Trelegy for asthma. She is unsure of the dose but also takes Singulair . Her blood pressure medication, triamterene  hydrochlorothiazide , was discontinued due to fluid retention. She reports that her dizziness is less severe when sitting.  She has a history of anxiety, exacerbated by recent stressors such as dealing with insurance and Medicare issues. She mentions a history of childhood trauma, which contributes to her anxiety. She experiences dizziness  and tingling in her fingers when anxious.  She takes over-the-counter mucus pills, which she has used for many years to manage mucus buildup, a recommendation from her primary care provider when she moved to the area in 2008.  Airview download 05/29/24-06/27/24 Usage days 28/30 days Average usage days used 4 hours 45 mins Pressure 20cm h20 Airleaks 34L/min (95%) AHI 1.0    Allergies  Allergen Reactions   Statins      pancreatitis   Beta Adrenergic Blockers     Asthma exacerbation   Codeine     REACTION: Nausea and vomiting   Erythromycin     REACTION: Nausea and vomiting    Immunization History  Administered Date(s) Administered   Fluad Quad(high Dose 65+) 08/05/2019, 07/28/2020, 08/31/2022   Influenza Split 09/27/2011, 08/04/2012   Influenza Whole 10/02/2007, 08/16/2008, 07/30/2010   Influenza, High Dose Seasonal PF 08/26/2013, 07/13/2020, 08/27/2021, 08/31/2022, 07/19/2023   Influenza,inj,Quad PF,6+ Mos 10/03/2014, 10/09/2015, 10/10/2016, 09/26/2017, 09/24/2018   Moderna Covid-19 Fall Seasonal Vaccine 33yrs & older 08/31/2022, 07/29/2023   PFIZER Comirnaty(Gray Top)Covid-19 Tri-Sucrose Vaccine 03/22/2021   PFIZER(Purple Top)SARS-COV-2 Vaccination 12/02/2019, 12/23/2019, 08/18/2020, 03/22/2021   Pfizer Covid-19 Vaccine Bivalent Booster 30yrs & up 08/27/2021   Pneumococcal Conjugate-13 10/03/2014   Pneumococcal Polysaccharide-23 03/31/2008, 06/22/2017   Respiratory Syncytial Virus Vaccine,Recomb Aduvanted(Arexvy) 07/25/2023   Td 10/02/2007, 10/10/2017   Zoster, Live 08/14/2010    Past Medical History:  Diagnosis Date   Allergy    Anxiety    associated with dyphagia   Arthritis    osteo of left hip   Asthma    moderate persistant   Complication of  anesthesia    extended anesthesia effect because of post polio syndrome   Deviated septum    2002   Hypertension    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   Osteophyte of cervical spine    pushing on esoghagus  causing dysphagia   Post-polio syndrome    Scarlet fever    Sleep apnea    severe - CPAP   Wears dentures    lower partial    Tobacco History: Social History   Tobacco Use  Smoking Status Former   Current packs/day: 0.00   Average packs/day: 0.5 packs/day for 8.0 years (4.0 ttl pk-yrs)   Types: Cigarettes   Start date: 10   Quit date: 27   Years since quitting: 45.6  Smokeless Tobacco Never  Tobacco Comments   quit 1981   Counseling given: Not Answered Tobacco comments: quit 1981   Outpatient Medications Prior to Visit  Medication Sig Dispense Refill   acetaminophen  (TYLENOL ) 500 MG tablet Take 1,000 mg by mouth as needed.     albuterol  (VENTOLIN  HFA) 108 (90 Base) MCG/ACT inhaler INHALE 2 PUFFS BY MOUTH EVERY 6 HOURS AS NEEDED FOR WHEEZING AND FOR SHORTNESS OF BREATH 9 g 2   aspirin EC 81 MG tablet Take 81 mg by mouth daily. Swallow whole.     Calcium Carbonate-Vitamin D  600-400 MG-UNIT tablet Take 1 tablet by mouth daily.     cetirizine (ZYRTEC) 10 MG tablet Take 10 mg by mouth daily as needed for allergies.     Dextromethorphan-Guaifenesin 20-400 MG TABS Take by mouth 4 (four) times daily as needed.     DOCOSAHEXAENOIC ACID PO Take by mouth.     Esomeprazole Magnesium  (NEXIUM PO) Take 20 mg by mouth daily.     ezetimibe  (ZETIA ) 10 MG tablet Take 1 tablet by mouth once daily 90 tablet 3   fenofibrate  (TRICOR ) 145 MG tablet Take 1 tablet (145 mg total) by mouth daily. 90 tablet 3   Fluticasone -Umeclidin-Vilant (TRELEGY ELLIPTA ) 200-62.5-25 MCG/ACT AEPB Inhale 1 puff into the lungs daily. 60 each 3   Glucosamine-Chondroitin-MSM 500-400-300 MG TABS Take 1 tablet by mouth 2 (two) times daily.     latanoprost  (XALATAN ) 0.005 % ophthalmic solution Place 1 drop into the left eye at bedtime.      Lidocaine  HCl (LIDAFLEX) 4 % PTCH Apply 1 patch topically in the morning.     Melatonin 5 MG TABS Take 20-40 mg by mouth at bedtime.      montelukast  (SINGULAIR ) 10 MG tablet TAKE  1 TABLET BY MOUTH AT BEDTIME 90 tablet 3   Multiple Vitamins-Minerals (MULTIVITAMIN WITH MINERALS) tablet Take 1 tablet by mouth daily.     OVER THE COUNTER MEDICATION CBD- sublingual for sleep every night and topically as needed for pain     Sennosides-Docusate Sodium 8.6-50 MG CAPS Take 3 capsules by mouth daily.      triamterene -hydrochlorothiazide  (MAXZIDE-25) 37.5-25 MG tablet Take 1 tablet by mouth daily. 90 tablet 3   No facility-administered medications prior to visit.   Review of Systems  Review of Systems  Constitutional: Negative.   Respiratory:  Positive for cough.   Psychiatric/Behavioral:  Positive for sleep disturbance. The patient is nervous/anxious.     Physical Exam  BP 126/64   Pulse 76   Temp 97.8 F (36.6 C)   Ht 5' (1.524 m)   Wt 105 lb 12.8 oz (48 kg)   SpO2 94% Comment: RA  BMI 20.66 kg/m  Physical Exam Constitutional:      General: She is not in acute distress.    Appearance: Normal appearance. She is not ill-appearing.  HENT:     Head: Normocephalic and atraumatic.  Cardiovascular:     Rate and Rhythm: Normal rate and regular rhythm.  Pulmonary:     Effort: Pulmonary effort is normal.     Breath sounds: Normal breath sounds.  Musculoskeletal:        General: Normal range of motion.  Skin:    General: Skin is warm and dry.  Neurological:     General: No focal deficit present.     Mental Status: She is alert and oriented to person, place, and time. Mental status is at baseline.  Psychiatric:        Mood and Affect: Mood normal.        Behavior: Behavior normal.        Thought Content: Thought content normal.        Judgment: Judgment normal.     Lab Results:  CBC    Component Value Date/Time   WBC 4.9 02/10/2024 1044   RBC 4.07 02/10/2024 1044   HGB 14.0 02/10/2024 1044   HCT 40.5 02/10/2024 1044   PLT 303.0 02/10/2024 1044   MCV 99.4 02/10/2024 1044   MCH 32.5 03/14/2022 1153   MCHC 34.5 02/10/2024 1044   RDW 13.0 02/10/2024  1044   LYMPHSABS 1.3 02/10/2024 1044   MONOABS 0.5 02/10/2024 1044   EOSABS 0.0 02/10/2024 1044   BASOSABS 0.1 02/10/2024 1044    BMET    Component Value Date/Time   NA 137 01/28/2024 1209   K 3.2 (L) 01/28/2024 1209   CL 99 01/28/2024 1209   CO2 29 01/28/2024 1209   GLUCOSE 92 01/28/2024 1209   BUN 16 01/28/2024 1209   CREATININE 0.72 01/28/2024 1209   CALCIUM 10.2 01/28/2024 1209   GFRNONAA >60 03/14/2022 1153   GFRAA >60 06/22/2017 0356    BNP No results found for: BNP  ProBNP No results found for: PROBNP  Imaging: No results found.   Assessment & Plan:   1. Obstructive sleep apnea syndrome (Primary)  2. Moderate persistent asthma without complication   Assessment and Plan Assessment & Plan Obstructive sleep apnea with insomnia Obstructive sleep apnea is well-controlled with CPAP therapy at pressure 20cm h20, as indicated by a residual apnea score of 1. Insomnia persists, likely exacerbated by stress and nocturnal awakenings due to caring for her elderly dog. Current use of melatonin is inconsistent in managing sleep disturbances. Rozerem was discussed as an alternative to melatonin, but trazodone  was chosen for its dual benefit in addressing insomnia and may help with mood, appetite and energy level.  - Prescribe trazodone  50 mg to be taken at bedtime, 30 to 60 minutes before sleep, with one refill.  Asthma Asthma is currently well controlled with Trelegy and Singulair . She reports improvement in dizziness since starting Trelegy. - Ordered for PFTs   Interstitial lung abnormalities Noncalcified pleural plaques seen on CT December 2024  On the CT scan of the chest  DEc 2024there is extremely mild interstitial lung abnormalities  (ILA) versus interstitial lung disease (ILD) On this visit July 2025 you are clinically stable   Plan - Do high-resolution CT chest in 6 months   Almarie LELON Ferrari, NP 06/29/2024

## 2024-06-29 NOTE — Patient Instructions (Addendum)
  VISIT SUMMARY: Today, we discussed your ongoing issues with sleep, asthma, dizziness, anxiety, and chronic mucus production. We reviewed your current treatments and made some adjustments to help improve your symptoms.  YOUR PLAN: -OBSTRUCTIVE SLEEP APNEA WITH INSOMNIA: Obstructive sleep apnea is a condition where your breathing stops and starts during sleep, and it is well-controlled with your CPAP therapy. However, your insomnia, or difficulty sleeping, is likely worsened by stress and waking up to care for your dog. Continue CPAP nightly.   -ASTHMA: Asthma is a condition where your airways narrow and swell, making it hard to breathe. Your asthma is currently managed with Trelegy and Singulair , and you have noticed an improvement in your dizziness since starting Trelegy.  -ANXIETY DISORDER: Anxiety is a feeling of worry or fear that can affect your daily life. Your anxiety is contributing to your sleep problems and symptoms like dizziness and tingling fingers. The trazodone  prescribed for your insomnia may also help reduce your anxiety.  -CHRONIC MUCUS PRODUCTION: Chronic mucus production means you have a long-term issue with mucus buildup. You have been managing this well with over-the-counter guaifenesin, and it is important to continue taking it regularly to prevent mucus buildup.  INSTRUCTIONS: Please start taking trazodone  50 mg at bedtime, 30 to 60 minutes before you go to sleep. You have one refill available. Continue using Trelegy and Singulair  for your asthma as prescribed. Make sure to take your over-the-counter guaifenesin regularly to manage mucus production. Follow up with us  if you have any new or worsening symptoms.  Follow-up Please schedule visit with Beth NP IN 4-6 weeks insomnia/medication follow-up (virtual visit ok)

## 2024-07-15 ENCOUNTER — Other Ambulatory Visit: Payer: Self-pay

## 2024-07-15 DIAGNOSIS — E782 Mixed hyperlipidemia: Secondary | ICD-10-CM

## 2024-07-16 ENCOUNTER — Ambulatory Visit: Payer: Self-pay | Admitting: Cardiology

## 2024-07-16 LAB — LIPID PANEL
Chol/HDL Ratio: 1.8 ratio (ref 0.0–4.4)
Cholesterol, Total: 127 mg/dL (ref 100–199)
HDL: 71 mg/dL (ref >39)
LDL Chol Calc (NIH): 41 mg/dL (ref 0–99)
Triglycerides: 75 mg/dL (ref 0–149)
VLDL Cholesterol Cal: 15 mg/dL (ref 5–40)

## 2024-07-30 ENCOUNTER — Telehealth: Payer: Self-pay | Admitting: Primary Care

## 2024-07-30 ENCOUNTER — Telehealth (INDEPENDENT_AMBULATORY_CARE_PROVIDER_SITE_OTHER): Admitting: Primary Care

## 2024-07-30 DIAGNOSIS — J4540 Moderate persistent asthma, uncomplicated: Secondary | ICD-10-CM

## 2024-07-30 DIAGNOSIS — J849 Interstitial pulmonary disease, unspecified: Secondary | ICD-10-CM | POA: Diagnosis not present

## 2024-07-30 DIAGNOSIS — F5101 Primary insomnia: Secondary | ICD-10-CM

## 2024-07-30 DIAGNOSIS — G4733 Obstructive sleep apnea (adult) (pediatric): Secondary | ICD-10-CM | POA: Diagnosis not present

## 2024-07-30 NOTE — Telephone Encounter (Signed)
 HRCT due late January-early feb 2026 Patient would like this to be done in [place], needs 15 min OV with Dr. Geronimo after CT  Cc: PCC/ admin team

## 2024-07-30 NOTE — Progress Notes (Signed)
 Virtual Visit via Video Note  I connected with Quisha Mabie Payton on 07/30/24 at  2:30 PM EDT by a video enabled telemedicine application and verified that I am speaking with the correct person using two identifiers.  Location: Patient: Home Provider: Office   I discussed the limitations of evaluation and management by telemedicine and the availability of in person appointments. The patient expressed understanding and agreed to proceed.  History of Present Illness: 83 year old female, former smoker.  Past medical history significant for hypertension, asthma, OSA, allergic rhinitis, postpolio syndrome, osteoarthritis, osteopenia, hypercholesterolemia.  Former patient of Dr. Shellia.  PSG 08/07/05 >> RDI 67.9, SpO2 low 61%   Previous [facility] pulmonary encounter:  06/29/2024- OSA follow-up  Discussed the use of AI scribe software for clinical note transcription with the patient, who gave verbal consent to proceed.  History of Present Illness MANA HABERL is an 83 year old female who presents for a follow-up regarding her CPAP therapy.  She experiences erratic sleep patterns, describing them as 'absolutely crazy.' She uses melatonin, which initially helped, but her sleep has become inconsistent due to stress and caring for her aging dog, requiring her to wake up two to three times a night. She sometimes struggles to return to sleep after these interruptions. She sometimes forgets to put the CPAP mask on or experiences a sensation of not being able to breathe, which she describes as 'like a wall.' Despite these issues, she occasionally sleeps well, noting a rare instance of sleeping 11 hours in a night. She aims for at least four hours of sleep but finds it challenging at times.  She experiences dizziness, which has improved since starting Trelegy for asthma. She is unsure of the dose but also takes Singulair . Her blood pressure medication, triamterene  hydrochlorothiazide , was discontinued due to fluid  retention. She reports that her dizziness is less severe when sitting.  She has a history of anxiety, exacerbated by recent stressors such as dealing with insurance and Medicare issues. She mentions a history of childhood trauma, which contributes to her anxiety. She experiences dizziness and tingling in her fingers when anxious.  She takes over-the-counter mucus pills, which she has used for many years to manage mucus buildup, a recommendation from her primary care provider when she moved to the area in 2008.  Airview download 05/29/24-06/27/24 Usage days 28/30 days Average usage days used 4 hours 45 mins Pressure 20cm h20 Airleaks 34L/min (95%) AHI 1.0    07/30/2024- Sleep follow-up Discussed the use of AI scribe software for clinical note transcription with the patient, who gave verbal consent to proceed.  History of Present Illness AUDRAY RUMORE is an 83 year old female with sleep apnea who presents for a follow-up regarding her medication and CPAP use.  Her sleep apnea is well-controlled with CPAP, which she uses consistently, averaging 4 hours and 44 minutes per night. The CPAP pressure is set at 20, but she experiences some air leaks and has been tightening the mask to address this issue. Since starting medication, her sleep duration has increased to 6-7 hours per night, compared to a previous maximum of 4 hours. She feels she is getting more restful sleep about 50% of the time. No residual grogginess in the morning.  She is currently taking trazodone  at a dose of 50 mg at bedtime and is considering increasing the dose to 100 mg if needed. She recently picked up a refill of her medication.  She continues to use Trelegy and Singulair  for asthma management without  any changes. She reports issues with her voice and dizziness, which she attributes to her double vision. She is working on managing her eye movements to prevent dizziness and shortness of breath, which occur several days a week.  Her shortness of breath has increased, but she is able to function by controlling her eye movements.  She does not use supplemental oxygen  but monitors her oxygen  levels at home. She reports a pulse oximetry reading of 89% that improved to 95% after rest.  She occasionally uses albuterol  for acute episodes of shortness of breath, noting a recent instance where she used more than the prescribed dose to clear her lungs. She monitors her pulse to avoid tachycardia. She has not experienced wheezing in the past ten years but does have a dry, raspy cough with moderate expectoration of white mucus. She takes Zyrtec and dextromethorphan-guaifenesin for symptom management.  She has a history of gastritis and esophagitis and takes Nexium regularly to manage these conditions.  She prefers to have her CT scan done in [place] as she can drive herself there, otherwise her son would need to take time off work.  Airview download 06/29/24-07/28/24 USAGE DAYS 30/30 DAYS Average usage 4 hours 44 mins Pressure 20cm h20 Airleaks 49L/min (95%) AHI 0.2    Observations/Objective:  Appears well without overt respiratory symptoms  Assessment and Plan:  1. Moderate persistent asthma, unspecified whether complicated  2. Obstructive sleep apnea syndrome (Primary)  3. ILD (interstitial lung disease) (HCC)  4. Primary insomnia  Assessment & Plan Obstructive sleep apnea with insomnia  Obstructive sleep apnea is well controlled with CPAP therapy. She uses CPAP 100% of the time, averaging 4 hours and 44 minutes per night. Current pressure 20cm h20; AHI 0.2/hour. Despite some air leaks, she reports improved sleep duration and quality with addition of Trazodone , with no residual morning grogginess.  - Adjust CPAP pressure from 20cm h20 to 18cm h20 due to airleaks and low AHI - Continue CPAP therapy 4-6 hours or longer per night  - Increase trazodone  to 100 mg at bedtime if needed - Notify provider via MyChart if  100 mg dose is effective for prescription adjustment  Asthma, stable on current therapy Asthma is well-managed with Trelegy 200mcg and Singulair . She occasionally uses albuterol  for acute symptoms and monitors pulse to avoid tachycardia. No recent wheezing, but a dry, raspy cough with expectoration of white mucus is present. - Continue Trelegy and Singulair  - Consider nebulizer treatment if albuterol  hfa is insufficient during acute episodes - Monitor for increased frequency of albuterol  use and report if needed  Gastritis and esophagitis, controlled on proton pump inhibitor Gastritis and esophagitis are controlled with Nexium. She continues Nexium to manage symptoms and prevent issues. - Continue Nexium for gastritis and esophagitis  Interstitial lung abnormalities Clinically stable. 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 Geronimo)    Recording duration: 16 minutes  Follow Up Instructions:   February with MR after CT   I discussed the assessment and treatment plan with the patient. The patient was provided an opportunity to ask questions and all were answered. The patient agreed with the plan and demonstrated an understanding of the instructions.   The patient was advised to call back or seek an in-person evaluation if the symptoms worsen  or if the condition fails to improve as anticipated.  I provided 22 minutes of non-face-to-face time during this encounter.   Almarie LELON Ferrari, NP

## 2024-08-02 NOTE — Telephone Encounter (Signed)
 Called and spoke with the patient and explained that Dr. Geronimo schedule is not open for 2026 and that is the reason appointment couldn't be scheduled.

## 2024-08-02 NOTE — Telephone Encounter (Signed)
 Pcc's will get the ct scheduled if we need to change the follow up appt to work with the ct we will please make the follow up

## 2024-08-02 NOTE — Telephone Encounter (Signed)
 Spoke with PT. PT needs a CT date set and has not been contacted to set date yet. PT would also like the CT to be in [place] or as close as possible. PT also needs a date to Follow up with [facility] for the CT results. Dr.Ramaswamy has no slots open for this to happen. Pt is confused on this matter and has some questions that they would like to as a CMA or Provider. Please advise

## 2024-08-02 NOTE — Telephone Encounter (Signed)
 Called PT no Answer left VM

## 2024-08-03 DIAGNOSIS — Z23 Encounter for immunization: Secondary | ICD-10-CM | POA: Diagnosis not present

## 2024-08-10 MED ORDER — TRAZODONE HCL 100 MG PO TABS: 100.0000 mg | ORAL_TABLET | Freq: Every day | ORAL | 1 refills | Status: AC

## 2024-08-10 NOTE — Telephone Encounter (Signed)
 RX sent

## 2024-11-09 ENCOUNTER — Ambulatory Visit: Payer: Medicare Other | Admitting: Family Medicine

## 2024-11-09 ENCOUNTER — Encounter: Payer: Self-pay | Admitting: Family Medicine

## 2024-11-09 ENCOUNTER — Ambulatory Visit: Payer: Self-pay | Admitting: Family Medicine

## 2024-11-09 VITALS — BP 120/60 | HR 78 | Temp 97.5°F | Ht 58.75 in | Wt 105.0 lb

## 2024-11-09 DIAGNOSIS — J4540 Moderate persistent asthma, uncomplicated: Secondary | ICD-10-CM

## 2024-11-09 DIAGNOSIS — I700 Atherosclerosis of aorta: Secondary | ICD-10-CM | POA: Diagnosis not present

## 2024-11-09 DIAGNOSIS — E7800 Pure hypercholesterolemia, unspecified: Secondary | ICD-10-CM | POA: Diagnosis not present

## 2024-11-09 DIAGNOSIS — E876 Hypokalemia: Secondary | ICD-10-CM

## 2024-11-09 DIAGNOSIS — J4550 Severe persistent asthma, uncomplicated: Secondary | ICD-10-CM

## 2024-11-09 DIAGNOSIS — Z5309 Procedure and treatment not carried out because of other contraindication: Secondary | ICD-10-CM

## 2024-11-09 DIAGNOSIS — I10 Essential (primary) hypertension: Secondary | ICD-10-CM

## 2024-11-09 DIAGNOSIS — J849 Interstitial pulmonary disease, unspecified: Secondary | ICD-10-CM

## 2024-11-09 LAB — BASIC METABOLIC PANEL WITH GFR
BUN: 23 mg/dL (ref 6–23)
CO2: 30 meq/L (ref 19–32)
Calcium: 10.5 mg/dL (ref 8.4–10.5)
Chloride: 101 meq/L (ref 96–112)
Creatinine, Ser: 0.95 mg/dL (ref 0.40–1.20)
GFR: 55.8 mL/min — ABNORMAL LOW
Glucose, Bld: 80 mg/dL (ref 70–99)
Potassium: 4.1 meq/L (ref 3.5–5.1)
Sodium: 139 meq/L (ref 135–145)

## 2024-11-09 NOTE — Assessment & Plan Note (Addendum)
 Noted on Chest CT 2024.  Statin contraindicated.  LDL  at goal <70.

## 2024-11-09 NOTE — Assessment & Plan Note (Signed)
 Chronic, well controlled  on HCTZ 25 mg p.o. daily.  No side effects. She does not want to stop this as it helps with swelling.

## 2024-11-09 NOTE — Progress Notes (Signed)
 "   Patient ID: Courtney Chandler, female    DOB: [birth year], 83 y.o.   MRN: [id number]  This visit was conducted in person.  BP 120/60   Pulse 78   Temp (!) 97.5 F (36.4 C) (Oral)   Ht 4' 10.75 (1.492 m)   Wt 105 lb (47.6 kg)   SpO2 96%   BMI 21.39 kg/m    CC:  Chief Complaint  Patient presents with   Medical Management of Chronic Issues    Annual Check Up-Not CPE-Patient declines MWV    Subjective:   HPI: Courtney Chandler is a 83 y.o. female presenting on 11/09/2024 for Medical Management of Chronic Issues (Annual Check Up-Not CPE-Patient declines MWV)  OSA on CPAP.SABRA per pt told by CPAP monitor that  it is severe. Followed by Dr. Shellia.  Recommends avoiding sedating meds.  She has been using CBD oil   Hypertension:   White coat HTN, improved control on hydrochlorothiazide  25 mg daily BP Readings from Last 3 Encounters:  11/09/24 120/60  06/29/24 126/64  06/10/24 (!) 126/52  Using medication without problems or lightheadedness: none Chest pain with exertion:none Edema: none Short of breath: stable Average home BPs: 120/70s now Other issues:  Asthma severe persistent: Stable control on Trelegy 200 one puff daily and singulair  10 mg qhs   and albuterol  as needed. Followed by Dr. Shellia.  CT chest pending for chronic cough.. possible dx of ILD.   OSA:  followed by Pulm  Reviewed last OV 06/10/2024   Contiued to have dyspnea spells.. CT chest showed possible mild ILD ( now followed by pulmonary), aortic atherosclerosis and CAD.  Neg stress test 17 years ago  Plan repeat CT in early 2026  Using  lidocaine  patches for low back /hip pain.  Using less tylneol.    Elevated Cholesterol: Good control with LDL at 41 on Zetia .   Side effects to statins in the past: Cause pancreatitis Lab Results  Component Value Date   CHOL 127 07/15/2024   HDL 71 07/15/2024   LDLCALC 41 07/15/2024   LDLDIRECT 99.0 10/21/2019   TRIG 75 07/15/2024   CHOLHDL 1.8 07/15/2024  Using  medications without problems: Muscle aches:  Diet compliance: heart healthy diet Exercise: walking Other complaints:  Postpolio syndrome, stable  Relevant past medical, surgical, family and social history reviewed and updated as indicated. Interim medical history since our last visit reviewed. Allergies and medications reviewed and updated. Outpatient Medications Prior to Visit  Medication Sig Dispense Refill   acetaminophen  (TYLENOL ) 500 MG tablet Take 1,000 mg by mouth as needed.     albuterol  (VENTOLIN  HFA) 108 (90 Base) MCG/ACT inhaler INHALE 2 PUFFS BY MOUTH EVERY 6 HOURS AS NEEDED FOR WHEEZING AND FOR SHORTNESS OF BREATH 9 g 2   aspirin EC 81 MG tablet Take 81 mg by mouth daily. Swallow whole.     Calcium Carbonate-Vitamin D  600-400 MG-UNIT tablet Take 1 tablet by mouth daily.     cetirizine (ZYRTEC) 10 MG tablet Take 10 mg by mouth daily as needed for allergies.     Dextromethorphan-Guaifenesin 20-400 MG TABS Take by mouth 4 (four) times daily as needed.     DOCOSAHEXAENOIC ACID PO Take by mouth.     Esomeprazole Magnesium  (NEXIUM PO) Take 20 mg by mouth daily.     ezetimibe  (ZETIA ) 10 MG tablet Take 1 tablet by mouth once daily 90 tablet 3   fenofibrate  (TRICOR ) 145 MG tablet Take 1 tablet (145 mg total) by  mouth daily. 90 tablet 3   Fluticasone -Umeclidin-Vilant (TRELEGY ELLIPTA ) 200-62.5-25 MCG/ACT AEPB Inhale 1 puff into the lungs daily. 60 each 3   Glucosamine-Chondroitin-MSM 500-400-300 MG TABS Take 1 tablet by mouth 2 (two) times daily.     latanoprost  (XALATAN ) 0.005 % ophthalmic solution Place 1 drop into the left eye at bedtime.      Lidocaine  HCl (LIDAFLEX) 4 % PTCH Apply 1 patch topically daily as needed.     montelukast  (SINGULAIR ) 10 MG tablet TAKE 1 TABLET BY MOUTH AT BEDTIME 90 tablet 3   Multiple Vitamins-Minerals (MULTIVITAMIN WITH MINERALS) tablet Take 1 tablet by mouth daily.     Sennosides-Docusate Sodium 8.6-50 MG CAPS Take 3 capsules by mouth daily.       traZODone  (DESYREL ) 100 MG tablet Take 1 tablet (100 mg total) by mouth at bedtime. 90 tablet 1   triamterene -hydrochlorothiazide  (MAXZIDE-25) 37.5-25 MG tablet Take 1 tablet by mouth daily. 90 tablet 3   OVER THE COUNTER MEDICATION CBD- sublingual for sleep every night and topically as needed for pain     No facility-administered medications prior to visit.     Per HPI unless specifically indicated in ROS section below Review of Systems  Constitutional:  Negative for fatigue and fever.  HENT:  Negative for congestion.   Eyes:  Negative for pain.  Respiratory:  Negative for cough and shortness of breath.   Cardiovascular:  Negative for chest pain, palpitations and leg swelling.  Gastrointestinal:  Negative for abdominal pain.  Genitourinary:  Negative for dysuria and vaginal bleeding.  Musculoskeletal:  Negative for back pain.  Neurological:  Negative for syncope, light-headedness and headaches.  Psychiatric/Behavioral:  Negative for dysphoric mood.    Objective:  BP 120/60   Pulse 78   Temp (!) 97.5 F (36.4 C) (Oral)   Ht 4' 10.75 (1.492 m)   Wt 105 lb (47.6 kg)   SpO2 96%   BMI 21.39 kg/m   Wt Readings from Last 3 Encounters:  11/09/24 105 lb (47.6 kg)  06/29/24 105 lb 12.8 oz (48 kg)  06/10/24 102 lb (46.3 kg)      Physical Exam Vitals and nursing note reviewed.  Constitutional:      General: She is not in acute distress.    Appearance: Normal appearance. She is well-developed. She is not ill-appearing or toxic-appearing.  HENT:     Head: Normocephalic.     Right Ear: Hearing, tympanic membrane, ear canal and external ear normal.     Left Ear: Hearing, tympanic membrane, ear canal and external ear normal.     Nose: Nose normal.  Eyes:     General: Lids are normal. Lids are everted, no foreign bodies appreciated.     Conjunctiva/sclera: Conjunctivae normal.     Pupils: Pupils are equal, round, and reactive to light.  Neck:     Thyroid: No thyroid mass or  thyromegaly.     Vascular: No carotid bruit.     Trachea: Trachea normal.  Cardiovascular:     Rate and Rhythm: Normal rate and regular rhythm.     Heart sounds: Normal heart sounds, S1 normal and S2 normal. No murmur heard.    No gallop.  Pulmonary:     Effort: Pulmonary effort is normal. No respiratory distress.     Breath sounds: Normal breath sounds. No wheezing, rhonchi or rales.  Abdominal:     General: Bowel sounds are normal. There is no distension or abdominal bruit.     Palpations: Abdomen is  soft. There is no fluid wave or mass.     Tenderness: There is no abdominal tenderness. There is no guarding or rebound.     Hernia: No hernia is present.  Musculoskeletal:     Cervical back: Normal range of motion and neck supple.  Feet:     Comments: Right foot drop wears brace Lymphadenopathy:     Cervical: No cervical adenopathy.  Skin:    General: Skin is warm and dry.     Findings: No rash.  Neurological:     Mental Status: She is alert.     Cranial Nerves: No cranial nerve deficit.     Sensory: No sensory deficit.  Psychiatric:        Mood and Affect: Mood is not anxious or depressed.        Speech: Speech normal.        Behavior: Behavior normal. Behavior is cooperative.        Judgment: Judgment normal.       Results for orders placed or performed in visit on 07/15/24  Lipid panel   Collection Time: 07/15/24  8:18 AM  Result Value Ref Range   Cholesterol, Total 127 100 - 199 mg/dL   Triglycerides 75 0 - 149 mg/dL   HDL 71 >60 mg/dL   VLDL Cholesterol Cal 15 5 - 40 mg/dL   LDL Chol Calc (NIH) 41 0 - 99 mg/dL   Chol/HDL Ratio 1.8 0.0 - 4.4 ratio     COVID 19 screen:  No recent travel or known exposure to COVID19 The patient denies respiratory symptoms of COVID 19 at this time. The importance of social distancing was discussed today.   Assessment and Plan The patient's preventative maintenance and recommended screening tests for an annual wellness exam were  reviewed in full today. Brought up to date unless services declined.  Counselled on the importance of diet, exercise, and its role in overall health and mortality. The patient's FH and SH was reviewed, including their home life, tobacco status, and drug and alcohol status.   Vaccines:Uptodate PNA, COVID x 5,given  Flu in 10/22.. considering shingrix but has had zostavax. Pap/DVE:  Not indicated Mammo:03/2023 nml .SABRA No further indicated. Bone Density:10/2023 osteopenia. Vit D  Recheck in 2-5  years. Colon: Colonoscopy: 02/2015 hemorrhoids are cause of positive IFOB, repeat colonoscopy  not indicated. Smoking Status: none ETOH/ drug use: none She has advance directives.  hep C:  done.   Problem List Items Addressed This Visit     Aortic atherosclerosis   Noted on Chest CT 2024.  Statin contraindicated.  LDL  at goal <70.      RESOLVED: Asthma, moderate persistent   Essential hypertension, benign   Chronic, well controlled  on HCTZ 25 mg p.o. daily.  No side effects. She does not want to stop this as it helps with swelling.          HYPERCHOLESTEROLEMIA - Primary    Good control with LDL at 41 on Zetia .   Side effects to statins in the past: Cause pancreatitis      Hypokalemia    Due for re-check,likely secondary to hydrochlorothiazide .  No longer taking KCL... has increased K in diet.      Interstitial lung disease (HCC)   Chronic, noted on CT... pending repeat CT in early 2026, PFTs.  Followed by pulmonary.  Symptomatic with dyspneic spells.      Severe persistent asthma without complication (HCC)   Chronic, stable control on Trelegy  200 1 puff daily and Singulair  10 mg p.o. nightly Followed by pulmonary      Statins contraindicated   Statin caused pancreatitis.          Greig Ring, MD   "

## 2024-11-09 NOTE — Assessment & Plan Note (Signed)
 Chronic, noted on CT... pending repeat CT in early 2026, PFTs.  Followed by pulmonary.  Symptomatic with dyspneic spells.

## 2024-11-09 NOTE — Patient Instructions (Signed)
 Please stop at the lab to have labs drawn.

## 2024-11-09 NOTE — Assessment & Plan Note (Deleted)
Chronic, stable control on Trelegy 200 1 puff daily and Singulair 10 mg p.o. nightly Followed by pulmonary

## 2024-11-09 NOTE — Assessment & Plan Note (Signed)
Chronic, stable control on Trelegy 200 1 puff daily and Singulair 10 mg p.o. nightly Followed by pulmonary

## 2024-11-09 NOTE — Assessment & Plan Note (Addendum)
"   Good control with LDL at 41 on Zetia  and tricor .   Triglycerdies now at goal. Side effects to statins in the past: Cause pancreatitis "

## 2024-11-09 NOTE — Assessment & Plan Note (Signed)
 Due for re-check,likely secondary to hydrochlorothiazide.  No longer taking KCL... has increased K in diet.

## 2024-11-09 NOTE — Assessment & Plan Note (Signed)
 Statin caused pancreatitis.

## 2024-11-14 ENCOUNTER — Other Ambulatory Visit: Payer: Self-pay | Admitting: Primary Care

## 2024-11-17 ENCOUNTER — Telehealth: Payer: Self-pay

## 2024-11-17 ENCOUNTER — Other Ambulatory Visit (HOSPITAL_COMMUNITY): Payer: Self-pay

## 2024-11-17 NOTE — Telephone Encounter (Signed)
 Copied from CRM [id number]. Topic: Clinical - Prescription Issue >> Nov 16, 2024  4:52 PM Devaughn RAMAN wrote: Reason for CRM: Pt called regarding TRELEGY ELLIPTA  200-62.5-25 MCG/ACT AEPB medication. Pt would like an alternative due to cost of medication, pt stated she cannot afford monthly or 3 month price. Please f/u with pt regarding this.   Spoke with pt  looks like its due to a deductable, she will have her son look into it

## 2024-11-18 ENCOUNTER — Ambulatory Visit
Admission: RE | Admit: 2024-11-18 | Discharge: 2024-11-18 | Disposition: A | Discharge status: Home / Self Care | Source: Ambulatory Visit | Attending: Internal Medicine | Admitting: Internal Medicine

## 2024-11-18 ENCOUNTER — Other Ambulatory Visit: Payer: Self-pay | Admitting: Primary Care

## 2024-11-18 DIAGNOSIS — J4540 Moderate persistent asthma, uncomplicated: Secondary | ICD-10-CM

## 2024-11-18 DIAGNOSIS — J929 Pleural plaque without asbestos: Secondary | ICD-10-CM

## 2024-11-18 DIAGNOSIS — J3081 Allergic rhinitis due to animal (cat) (dog) hair and dander: Secondary | ICD-10-CM

## 2024-11-18 DIAGNOSIS — G4733 Obstructive sleep apnea (adult) (pediatric): Secondary | ICD-10-CM

## 2024-11-18 DIAGNOSIS — Z9109 Other allergy status, other than to drugs and biological substances: Secondary | ICD-10-CM

## 2024-11-18 DIAGNOSIS — R918 Other nonspecific abnormal finding of lung field: Secondary | ICD-10-CM

## 2024-12-01 ENCOUNTER — Other Ambulatory Visit: Payer: Self-pay | Admitting: Family Medicine

## 2024-12-07 ENCOUNTER — Ambulatory Visit: Payer: Self-pay | Admitting: Internal Medicine

## 2024-12-07 NOTE — Progress Notes (Signed)
 Will discuss feb 2026 visit   IMPRESSION: 1. Mild basilar interstitial lung abnormality. 2. Moderate hiatal hernia. 3. T12 compression fracture is new in the interval but age indeterminate. 4. Aortic atherosclerosis (ICD10-I70.0). Coronary artery calcification.     Electronically Signed   By: Newell Eke M.D.   On: 11/20/2024 13:59

## 2024-12-21 ENCOUNTER — Ambulatory Visit: Admitting: Internal Medicine

## 2025-11-03 ENCOUNTER — Other Ambulatory Visit

## 2025-11-10 ENCOUNTER — Encounter: Admitting: Family Medicine
